# Patient Record
Sex: Female | Born: 1955 | Race: White | Hispanic: No | Marital: Married | Smoking: Never smoker
Health system: Southern US, Community
[De-identification: ages and names within clinical notes are randomized; demographics above are authoritative.]

## PROBLEM LIST (undated history)

## (undated) DIAGNOSIS — I10 Essential (primary) hypertension: Secondary | ICD-10-CM

## (undated) DIAGNOSIS — E785 Hyperlipidemia, unspecified: Secondary | ICD-10-CM

## (undated) DIAGNOSIS — Z8042 Family history of malignant neoplasm of prostate: Secondary | ICD-10-CM

## (undated) DIAGNOSIS — Z800 Family history of malignant neoplasm of digestive organs: Secondary | ICD-10-CM

## (undated) DIAGNOSIS — Z803 Family history of malignant neoplasm of breast: Secondary | ICD-10-CM

## (undated) DIAGNOSIS — C439 Malignant melanoma of skin, unspecified: Secondary | ICD-10-CM

## (undated) DIAGNOSIS — E039 Hypothyroidism, unspecified: Secondary | ICD-10-CM

## (undated) DIAGNOSIS — C50919 Malignant neoplasm of unspecified site of unspecified female breast: Secondary | ICD-10-CM

## (undated) DIAGNOSIS — Z923 Personal history of irradiation: Secondary | ICD-10-CM

## (undated) DIAGNOSIS — Z85820 Personal history of malignant melanoma of skin: Secondary | ICD-10-CM

## (undated) DIAGNOSIS — I8390 Asymptomatic varicose veins of unspecified lower extremity: Secondary | ICD-10-CM

## (undated) HISTORY — PX: BREAST LUMPECTOMY: SHX2

## (undated) HISTORY — DX: Hypothyroidism, unspecified: E03.9

## (undated) HISTORY — DX: Family history of malignant neoplasm of prostate: Z80.42

## (undated) HISTORY — PX: VARICOSE VEIN SURGERY: SHX832

## (undated) HISTORY — PX: BREAST BIOPSY: SHX20

## (undated) HISTORY — DX: Hyperlipidemia, unspecified: E78.5

## (undated) HISTORY — DX: Family history of malignant neoplasm of digestive organs: Z80.0

## (undated) HISTORY — DX: Asymptomatic varicose veins of unspecified lower extremity: I83.90

## (undated) HISTORY — DX: Personal history of malignant melanoma of skin: Z85.820

## (undated) HISTORY — DX: Family history of malignant neoplasm of breast: Z80.3

## (undated) HISTORY — DX: Malignant melanoma of skin, unspecified: C43.9

---

## 2004-03-12 HISTORY — PX: BREAST BIOPSY: SHX20

## 2007-06-12 LAB — HM DEXA SCAN: HM DEXA SCAN: NORMAL

## 2007-06-15 ENCOUNTER — Encounter: Admission: RE | Admit: 2007-06-15 | Discharge: 2007-06-15 | Payer: Self-pay | Admitting: Family Medicine

## 2008-06-20 ENCOUNTER — Encounter: Admission: RE | Admit: 2008-06-20 | Discharge: 2008-06-20 | Payer: Self-pay | Admitting: Family Medicine

## 2008-07-26 ENCOUNTER — Ambulatory Visit: Payer: Self-pay | Admitting: Gastroenterology

## 2008-08-30 ENCOUNTER — Ambulatory Visit: Payer: Self-pay | Admitting: Internal Medicine

## 2009-06-24 ENCOUNTER — Encounter: Admission: RE | Admit: 2009-06-24 | Discharge: 2009-06-24 | Payer: Self-pay | Admitting: Family Medicine

## 2010-07-11 LAB — HM MAMMOGRAPHY: HM MAMMO: NORMAL

## 2010-07-29 ENCOUNTER — Encounter: Admission: RE | Admit: 2010-07-29 | Discharge: 2010-07-29 | Payer: Self-pay | Admitting: Family Medicine

## 2010-09-09 LAB — HM PAP SMEAR: HM PAP: NORMAL

## 2010-09-14 ENCOUNTER — Emergency Department (HOSPITAL_COMMUNITY)
Admission: EM | Admit: 2010-09-14 | Discharge: 2010-09-15 | Payer: Self-pay | Source: Home / Self Care | Admitting: Emergency Medicine

## 2010-09-15 ENCOUNTER — Ambulatory Visit
Admission: RE | Admit: 2010-09-15 | Discharge: 2010-09-15 | Payer: Self-pay | Source: Home / Self Care | Attending: Orthopedic Surgery | Admitting: Orthopedic Surgery

## 2010-12-22 LAB — POCT HEMOGLOBIN-HEMACUE: Hemoglobin: 12.7 g/dL (ref 12.0–15.0)

## 2012-10-26 ENCOUNTER — Other Ambulatory Visit: Payer: Self-pay | Admitting: Family Medicine

## 2012-10-26 DIAGNOSIS — Z1231 Encounter for screening mammogram for malignant neoplasm of breast: Secondary | ICD-10-CM

## 2012-11-29 ENCOUNTER — Ambulatory Visit
Admission: RE | Admit: 2012-11-29 | Discharge: 2012-11-29 | Disposition: A | Discharge status: Home / Self Care | Payer: 59 | Source: Ambulatory Visit | Attending: Family Medicine | Admitting: Family Medicine

## 2012-11-29 DIAGNOSIS — Z1231 Encounter for screening mammogram for malignant neoplasm of breast: Secondary | ICD-10-CM

## 2014-01-14 ENCOUNTER — Other Ambulatory Visit: Payer: Self-pay

## 2014-01-14 DIAGNOSIS — Z1231 Encounter for screening mammogram for malignant neoplasm of breast: Secondary | ICD-10-CM

## 2014-01-30 ENCOUNTER — Ambulatory Visit
Admission: RE | Admit: 2014-01-30 | Discharge: 2014-01-30 | Disposition: A | Discharge status: Home / Self Care | Payer: 59 | Source: Ambulatory Visit

## 2014-01-30 DIAGNOSIS — Z1231 Encounter for screening mammogram for malignant neoplasm of breast: Secondary | ICD-10-CM

## 2014-06-28 LAB — BASIC METABOLIC PANEL
BUN: 20 mg/dL (ref 4–21)
Creatinine: 0.8 mg/dL (ref <=1.1)
GLUCOSE: 90 mg/dL
Potassium: 4.6 mmol/L (ref 3.4–5.3)
Sodium: 139 mmol/L (ref 137–147)

## 2014-06-28 LAB — LIPID PANEL
CHOLESTEROL: 234 mg/dL — AB (ref 0–200)
HDL: 66 mg/dL (ref 35–70)
LDL CALC: 167 mg/dL
LDl/HDL Ratio: 2.5
Triglycerides: 94 mg/dL (ref 40–160)

## 2014-06-28 LAB — HEMOGLOBIN A1C: Hgb A1c MFr Bld: 5.9 % (ref 4.0–6.0)

## 2014-06-28 LAB — HEPATIC FUNCTION PANEL
ALT: 15 U/L (ref 7–35)
AST: 15 U/L (ref 13–35)

## 2014-06-28 LAB — CBC AND DIFFERENTIAL
HEMATOCRIT: 42 % (ref 36–46)
Hemoglobin: 14 g/dL (ref 12.0–16.0)
Platelets: 269 10*3/uL (ref 150–399)
WBC: 4.6 10^3/mL

## 2014-07-23 ENCOUNTER — Encounter: Payer: Self-pay | Admitting: Internal Medicine

## 2015-01-06 ENCOUNTER — Other Ambulatory Visit: Payer: Self-pay

## 2015-01-06 ENCOUNTER — Encounter: Payer: Self-pay | Admitting: Family Medicine

## 2015-01-06 ENCOUNTER — Ambulatory Visit (INDEPENDENT_AMBULATORY_CARE_PROVIDER_SITE_OTHER): Payer: 59 | Admitting: Family Medicine

## 2015-01-06 DIAGNOSIS — Z79890 Hormone replacement therapy (postmenopausal): Secondary | ICD-10-CM | POA: Diagnosis not present

## 2015-01-06 DIAGNOSIS — C439 Malignant melanoma of skin, unspecified: Secondary | ICD-10-CM | POA: Insufficient documentation

## 2015-01-06 DIAGNOSIS — F329 Major depressive disorder, single episode, unspecified: Secondary | ICD-10-CM

## 2015-01-06 DIAGNOSIS — E039 Hypothyroidism, unspecified: Secondary | ICD-10-CM | POA: Diagnosis not present

## 2015-01-06 DIAGNOSIS — I8390 Asymptomatic varicose veins of unspecified lower extremity: Secondary | ICD-10-CM | POA: Insufficient documentation

## 2015-01-06 DIAGNOSIS — Z85820 Personal history of malignant melanoma of skin: Secondary | ICD-10-CM | POA: Insufficient documentation

## 2015-01-06 DIAGNOSIS — F32A Depression, unspecified: Secondary | ICD-10-CM

## 2015-01-06 LAB — TSH: TSH: 2.9

## 2015-01-06 LAB — T4, FREE
T3, Free: 2.7
T4, Total: 1.04

## 2015-01-06 NOTE — Progress Notes (Signed)
Julie Reddish, MD Phone: [phone]  Subjective:  Patient presents today to establish care with [place] as Julie Poot, MD who is not practicing currently.  Julie Pugh, alternative medicine seeing alongside Julie Pugh. She was treating patient with hormone therapy.Chief complaint-noted.   Depression/Stress Complains of depressed mood for several months. Denies history of this. No significant psychiatric family history. She has good support system through church but unfortunately not through her husband. Her PHQ9 today is 11. She scores 2 for #1 and 3 for #2 and 0 for #9. She rates this as making her life somewhat difficult.  She has seen a Christian therapist Julie Pugh in the past with difficulties with her teenage daughter but never for depression. Stressors: 1. Occasional headache, slightly more tired, not as much energy over last few months. Caring for almost 61 year old mother for almost 4 months with history 3 falls and needing constant care. Sister will care for mother after April 16th.  2. Rough time with marriage-considering divorce-disconnected 3. Freshman at [employer] and empty nest since fall 2016.   ROS-Denies suicidal or homicidal ideation.  Hypothyroidism-well-controlled on Armour thyroid 60 mg Lab Results  Component Value Date   TSH 2.9 06/28/2014   On thyroid medication-yes and compliant ROS-No hair or nail changes. No heat/cold intolerance. No constipation or diarrhea. Denies shakiness. No palpitations.    The following were reviewed and entered/updated in epic: Past Medical History  Diagnosis Date  . Hypothyroidism     Dr. Jaynie Pugh managing. armour thyroid 60mg    . Melanoma     R shin 2009. sees derm q6 months  . Varicose vein    Patient Active Problem List   Diagnosis Date Noted  . Depression 01/07/2015    Priority: Medium  . Hypothyroidism     Priority: Medium  . Melanoma     Priority: Medium  . Hormone replacement therapy 01/07/2015    Priority:  Low  . Varicose vein     Priority: Low   Past Surgical History  Procedure Laterality Date  . Breast biopsy      benign 2000-stereotactic biopsy  . Varicose vein surgery      2001    Family History  Problem Relation Age of Onset  . Panic disorder Mother   . COPD Mother     smoked until 40  . Congestive Heart Failure Mother     pacemaker  . Heart attack Father     bypass 56, 77 fatal MI, former smoker  . Hyperlipidemia Sister   . Hypertension Mother   No family history of bipolar  Medications- reviewed and updated Current Outpatient Prescriptions  Medication Sig Dispense Refill  . estradiol (ESTRACE) 0.5 MG tablet Take 0.5 mg by mouth daily. Estradiol/progesterone 0.5/50mg     . thyroid (ARMOUR) 60 MG tablet Take 60 mg by mouth daily before breakfast.    . Vitamin Mixture (ESTER-C PO) Take by mouth.     Allergies-reviewed and updated No Known Allergies  History   Social History  . Marital Status: Married    Spouse Name: N/A  . Number of Children: N/A  . Years of Education: N/A   Social History Main Topics  . Smoking status: Never Smoker   . Smokeless tobacco: Not on file  . Alcohol Use: 2.4 oz/week    4 Standard drinks or equivalent per week  . Drug Use: No  . Sexual Activity: Yes   Other Topics Concern  . Not on file   Social History Narrative  Married (marriage is a stressor). 1 daughter Julie Pugh at [employer] age 24 in 2016.       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       Hobbies: tennis, walking, time with dogs, pickleball. Reading. Outdoors.     ROS--See HPI , otherwise full ROS was completed and negative except as noted above  Objective: BP 128/78 mmHg  Pulse 64  Temp(Src) 98.9 F (37.2 C)  Ht 5\' 7"  (1.702 m)  Wt 165 lb (74.844 kg)  BMI 25.84 kg/m2 Gen: NAD, resting comfortably HEENT: Mucous membranes are moist. Oropharynx normal. TM normal. Eyes: sclera and lids normal, PERRLA Neck: no  thyromegaly, no cervical lymphadenopathy CV: RRR no murmurs rubs or gallops Lungs: CTAB no crackles, wheeze, rhonchi Abdomen: soft/nontender/nondistended/normal bowel sounds. No rebound or guarding.  Ext: trace edema L (hx melanoma excision), no edema R Skin: warm, dry, 2+ PT pulses Neuro: 5/5 strength in upper and lower extremities, normal gait, normal reflexes   Assessment/Plan:  Depression Poor control as evidenced by PHQ9 of 11. New diagnosis. We discussed medication versus therapy option. Patient would like to start seeing a therapist again. He decided we will check again in 6 weeks. We will consider medication options at that time if not making improvement. She knows the return precautions for sooner return   Hypothyroidism Patient has done very well on Armour thyroid. We discussed synthetics are much more commonly use and data supported.  Given stability the patient would like to continue on current therapy think that is reasonable. Continue Armour Thyroid 60 mg   Hormone replacement therapy We decided to taper off these medicines. Patient is not sure if she is helped by them. she has been off for 6 months and then on for 5 weeks and has not noted any changes. she does have some hot flashes that are unchanged on medication most recently but improved in the past on medication.    six-week follow-up   We also discussed the role of exercise in helping patient with depression and helping her maintain normal weight. 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 She previously has been very active and tends to times a week and walking basis.  Meds ordered this encounter  Medications  . thyroid (ARMOUR) 60 MG tablet    Sig: Take 60 mg by mouth daily before breakfast.  . Vitamin Mixture (ESTER-C PO)    Sig: Take by mouth.  . DISCONTD: estradiol (ESTRACE) 0.5 MG tablet    Sig: Take 0.5 mg by mouth daily. Estradiol/progesterone  0.5/50mg    >50% of 45 minute office visit was spent on counseling in regards to depression and treatment options and coordination of care

## 2015-01-06 NOTE — Patient Instructions (Addendum)
Depression  Start back with your counselor. Try to meet at least weekly.   See me back in 6 weeks to check in.   I am here for you if you need to talk before then.   Recheck phq9 at follow up, get repeat thyroid if still not controlled  Blood pressure looks great and will recheck at follow up.   Continue your armour thyroid alone. Take your estrogen pill 1/2 tab for a week then 1/2 tab every other day for a week then stop.

## 2015-01-07 ENCOUNTER — Other Ambulatory Visit: Payer: Self-pay

## 2015-01-07 ENCOUNTER — Encounter: Payer: Self-pay | Admitting: Family Medicine

## 2015-01-07 DIAGNOSIS — Z1231 Encounter for screening mammogram for malignant neoplasm of breast: Secondary | ICD-10-CM

## 2015-01-07 DIAGNOSIS — F32A Depression, unspecified: Secondary | ICD-10-CM | POA: Insufficient documentation

## 2015-01-07 DIAGNOSIS — Z79890 Hormone replacement therapy (postmenopausal): Secondary | ICD-10-CM | POA: Insufficient documentation

## 2015-01-07 DIAGNOSIS — F329 Major depressive disorder, single episode, unspecified: Secondary | ICD-10-CM | POA: Insufficient documentation

## 2015-01-07 NOTE — Assessment & Plan Note (Signed)
We decided to taper off these medicines. Patient is not sure if she is helped by them. she has been off for 6 months and then on for 5 weeks and has not noted any changes. she does have some hot flashes that are unchanged on medication most recently but improved in the past on medication.

## 2015-01-07 NOTE — Assessment & Plan Note (Signed)
Poor control as evidenced by PHQ9 of 11. New diagnosis. We discussed medication versus therapy option. Patient would like to start seeing a therapist again. He decided we will check again in 6 weeks. We will consider medication options at that time if not making improvement. She knows the return precautions for sooner return

## 2015-01-07 NOTE — Assessment & Plan Note (Signed)
Patient has done very well on Armour thyroid. We discussed synthetics are much more commonly use and data supported.  Given stability the patient would like to continue on current therapy think that is reasonable. Continue Armour Thyroid 60 mg

## 2015-01-09 ENCOUNTER — Encounter: Payer: Self-pay | Admitting: Family Medicine

## 2015-01-12 ENCOUNTER — Encounter: Payer: Self-pay | Admitting: Family Medicine

## 2015-01-12 DIAGNOSIS — R87619 Unspecified abnormal cytological findings in specimens from cervix uteri: Secondary | ICD-10-CM | POA: Insufficient documentation

## 2015-02-04 ENCOUNTER — Ambulatory Visit
Admission: RE | Admit: 2015-02-04 | Discharge: 2015-02-04 | Disposition: A | Discharge status: Home / Self Care | Payer: 59 | Source: Ambulatory Visit

## 2015-02-04 DIAGNOSIS — Z1231 Encounter for screening mammogram for malignant neoplasm of breast: Secondary | ICD-10-CM

## 2015-02-19 ENCOUNTER — Encounter: Payer: Self-pay | Admitting: Family Medicine

## 2015-02-19 ENCOUNTER — Ambulatory Visit (INDEPENDENT_AMBULATORY_CARE_PROVIDER_SITE_OTHER): Payer: 59 | Admitting: Family Medicine

## 2015-02-19 VITALS — BP 128/72 | HR 66 | Temp 98.5°F | Wt 167.0 lb

## 2015-02-19 DIAGNOSIS — I868 Varicose veins of other specified sites: Secondary | ICD-10-CM

## 2015-02-19 DIAGNOSIS — F329 Major depressive disorder, single episode, unspecified: Secondary | ICD-10-CM | POA: Diagnosis not present

## 2015-02-19 DIAGNOSIS — Z79890 Hormone replacement therapy (postmenopausal): Secondary | ICD-10-CM | POA: Diagnosis not present

## 2015-02-19 DIAGNOSIS — F32A Depression, unspecified: Secondary | ICD-10-CM

## 2015-02-19 DIAGNOSIS — I8390 Asymptomatic varicose veins of unspecified lower extremity: Secondary | ICD-10-CM

## 2015-02-19 NOTE — Assessment & Plan Note (Signed)
Estradiol/progesterone 0.5/50mg  previously under the care Dr. Jaynie Collins on therapy >5 years. Now down to 1 capsule every other day. Patient will stop when runs out-primary Treatment was for hot flashes which has not improved much. Discussed benefits/risks and patient will follow up if decides does have a benefit when comes off.

## 2015-02-19 NOTE — Patient Instructions (Addendum)
I would continue to see your counselor but depression in much better shape. I would keep your counselor's number close in case there are any changes.   I don't think you have to see me unless you need me until Late September for annual physical. Labs standard a week before plus HIV per national screening recommendations-do this under unprotected sex with husband. Then get Pap day of.   Health Maintenance Due  Topic Date Due  . HIV Screening  01/21/1969  . TETANUS/TDAP  01/21/1973  . PAP SMEAR  09/09/2013  . ZOSTAVAX  01/21/2014

## 2015-02-19 NOTE — Progress Notes (Signed)
Garret Reddish, MD  Subjective:  Julie Pugh is a 59 y.o. year old very pleasant female patient who presents with:  Depression follow up- improved -PHQ9 last visit of 11, new diagnosis. Patient wanted to work with Darrick Meigs therapist and return to care without medication changes. She followed with her therapist and PHQ9 came down today to a 2 with no anhedonia or depressed mood. A lot of this improvement came when her mother moved out of the house a few weeks ago. 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 No depressed mood  Hormone replacement therapy- stable smptoms Decided to taper off of medications. Had been off 6 months then off 5 weeks and did not note improvement in her hot flashes. 1/2 tab for a week then 1/2 tab evey other day for week then stop was planned. Unfortunately it was in capsule form per patient. She has been taking full tab every other day and plans to stop when runsout.   Varicose veins L leg. Prior surgery 2001. Prominent some aching. No worsening.   ROS- no chest pain, shortness of breath, calf pain  Past Medical History- hypothyroidism, melanoma history  Medications- reviewed and updated Current Outpatient Prescriptions  Medication Sig Dispense Refill  . estradiol (ESTRACE) 0.5 MG tablet Take 0.5 mg by mouth daily.    Marland Kitchen thyroid (ARMOUR) 60 MG tablet Take 60 mg by mouth daily before breakfast.    . Vitamin Mixture (ESTER-C PO) Take by mouth.     No current facility-administered medications for this visit.    Objective: BP 128/72 mmHg  Pulse 66  Temp(Src) 98.5 F (36.9 C)  Wt 167 lb (75.751 kg) Gen: NAD, resting comfortably CV: RRR no murmurs rubs or gallops Lungs: CTAB no crackles, wheeze, rhonchi Abdomen: soft/nontender/nondistended/normal bowel sounds. No rebound or guarding.  Ext: no edema, L leg larger than right at baseline at ankle though no pitting edema. Prominent left leg varicose veins.  Skin: warm,  dry Neuro: grossly normal, moves all extremities   Assessment/Plan:  Depression Improved PHQ9 from 11 to 2 using Christian therapist Arvil Chaco. Mom moved back with sister for 8 months which has helped a great deal. We will plan for 6 month follow up but closer follow up prn   Varicose vein Prominent vessel anterior shin. Some aching at times but tolerable. Discussed vein/vascular referral but patient declines at present.    Hormone replacement therapy Estradiol/progesterone 0.5/50mg  previously under the care Dr. Jaynie Collins on therapy >5 years. Now down to 1 capsule every other day. Patient will stop when runs out-primary Treatment was for hot flashes which has not improved much. Discussed benefits/risks and patient will follow up if decides does have a benefit when comes off.    September for CPE including PAP.

## 2015-02-19 NOTE — Assessment & Plan Note (Signed)
Prominent vessel anterior shin. Some aching at times but tolerable. Discussed vein/vascular referral but patient declines at present.

## 2015-02-19 NOTE — Assessment & Plan Note (Signed)
Improved PHQ9 from 11 to 2 using Christian therapist Arvil Chaco. Mom moved back with sister for 8 months which has helped a great deal. We will plan for 6 month follow up but closer follow up prn

## 2015-07-03 ENCOUNTER — Other Ambulatory Visit (INDEPENDENT_AMBULATORY_CARE_PROVIDER_SITE_OTHER): Payer: 59

## 2015-07-03 DIAGNOSIS — Z0000 Encounter for general adult medical examination without abnormal findings: Secondary | ICD-10-CM | POA: Diagnosis not present

## 2015-07-03 LAB — CBC WITH DIFFERENTIAL/PLATELET
Basophils Absolute: 0 K/uL (ref 0.0–0.1)
Basophils Relative: 0.5 % (ref 0.0–3.0)
Eosinophils Absolute: 0.2 K/uL (ref 0.0–0.7)
Eosinophils Relative: 3.2 % (ref 0.0–5.0)
HCT: 42.7 % (ref 36.0–46.0)
Hemoglobin: 14.5 g/dL (ref 12.0–15.0)
Lymphocytes Relative: 32.6 % (ref 12.0–46.0)
Lymphs Abs: 1.7 K/uL (ref 0.7–4.0)
MCHC: 34 g/dL (ref 30.0–36.0)
MCV: 88.9 fl (ref 78.0–100.0)
Monocytes Absolute: 0.5 K/uL (ref 0.1–1.0)
Monocytes Relative: 9.5 % (ref 3.0–12.0)
Neutro Abs: 2.9 K/uL (ref 1.4–7.7)
Neutrophils Relative %: 54.2 % (ref 43.0–77.0)
Platelets: 268 K/uL (ref 150.0–400.0)
RBC: 4.8 Mil/uL (ref 3.87–5.11)
RDW: 12.6 % (ref 11.5–15.5)
WBC: 5.3 K/uL (ref 4.0–10.5)

## 2015-07-03 LAB — BASIC METABOLIC PANEL
BUN: 21 mg/dL (ref 6–23)
CHLORIDE: 107 meq/L (ref 96–112)
CO2: 29 mEq/L (ref 19–32)
Calcium: 10.1 mg/dL (ref 8.4–10.5)
Creatinine, Ser: 0.91 mg/dL (ref 0.40–1.20)
GFR: 66.7 mL/min (ref >60.00)
GLUCOSE: 95 mg/dL (ref 70–99)
POTASSIUM: 4.7 meq/L (ref 3.5–5.1)
Sodium: 141 mEq/L (ref 135–145)

## 2015-07-03 LAB — POCT URINALYSIS DIPSTICK
Bilirubin, UA: NEGATIVE
Glucose, UA: NEGATIVE
KETONES UA: NEGATIVE
Leukocytes, UA: NEGATIVE
Nitrite, UA: NEGATIVE
PH UA: 5
PROTEIN UA: NEGATIVE
RBC UA: NEGATIVE
Urobilinogen, UA: 0.2

## 2015-07-03 LAB — LIPID PANEL
Cholesterol: 219 mg/dL — ABNORMAL HIGH (ref 0–200)
HDL: 54 mg/dL (ref >39.00)
LDL Cholesterol: 150 mg/dL — ABNORMAL HIGH (ref 0–99)
NONHDL: 165.04
Total CHOL/HDL Ratio: 4
Triglycerides: 77 mg/dL (ref 0.0–149.0)
VLDL: 15.4 mg/dL (ref 0.0–40.0)

## 2015-07-03 LAB — HEPATIC FUNCTION PANEL
ALT: 14 U/L (ref 0–35)
AST: 15 U/L (ref 0–37)
Albumin: 3.9 g/dL (ref 3.5–5.2)
Alkaline Phosphatase: 53 U/L (ref 39–117)
Bilirubin, Direct: 0.1 mg/dL (ref 0.0–0.3)
Total Bilirubin: 0.5 mg/dL (ref 0.2–1.2)
Total Protein: 7.1 g/dL (ref 6.0–8.3)

## 2015-07-03 LAB — TSH: TSH: 3.33 u[IU]/mL (ref 0.35–4.50)

## 2015-07-10 ENCOUNTER — Ambulatory Visit (INDEPENDENT_AMBULATORY_CARE_PROVIDER_SITE_OTHER): Payer: 59 | Admitting: Family Medicine

## 2015-07-10 ENCOUNTER — Other Ambulatory Visit (HOSPITAL_COMMUNITY)
Admission: RE | Admit: 2015-07-10 | Discharge: 2015-07-10 | Disposition: A | Discharge status: Home / Self Care | Payer: 59 | Source: Ambulatory Visit | Attending: Family Medicine | Admitting: Family Medicine

## 2015-07-10 ENCOUNTER — Encounter: Payer: Self-pay | Admitting: Family Medicine

## 2015-07-10 VITALS — BP 138/86 | HR 87 | Temp 98.4°F | Ht 67.0 in | Wt 162.0 lb

## 2015-07-10 DIAGNOSIS — E785 Hyperlipidemia, unspecified: Secondary | ICD-10-CM | POA: Insufficient documentation

## 2015-07-10 DIAGNOSIS — E039 Hypothyroidism, unspecified: Secondary | ICD-10-CM

## 2015-07-10 DIAGNOSIS — Z0000 Encounter for general adult medical examination without abnormal findings: Secondary | ICD-10-CM

## 2015-07-10 DIAGNOSIS — Z01419 Encounter for gynecological examination (general) (routine) without abnormal findings: Secondary | ICD-10-CM | POA: Diagnosis not present

## 2015-07-10 NOTE — Patient Instructions (Addendum)
Thyroid recheck in 3 months- normal off medicine, hopefully you can remain off. If you gain weight, get constipated, note yourself feeling cooler, we will repeat earlier  [place] job losing a few lbs. Cholesterol is doing some better. No cholesterol medicine at this time, recheck yearly.   Overall I think you are doing great  Would advise getting Tetanus shot before you go on medicare  See you back in a year

## 2015-07-10 NOTE — Progress Notes (Signed)
Julie Reddish, MD Phone: [phone]  Subjective:  Patient presents today for their annual physical. Chief complaint-noted.   -overall doing well. Stopped taking thyroid medicine yet levels are normal. Things going much better in her life, has not even had to see her counselor ROS- full  review of systems was completed and negative  Pertinent negative: No hair or nail changes. No heat/cold intolerance. No constipation or diarrhea. Denies shakiness or anxiety.  No palpitations.   The following were reviewed and entered/updated in epic: Past Medical History  Diagnosis Date  . Hypothyroidism     Dr. Jaynie Collins managing. armour thyroid 60mg    . Melanoma     R shin 2009. sees derm q6 months  . Varicose vein    Patient Active Problem List   Diagnosis Date Noted  . Depression 01/07/2015    Priority: Medium  . Hypothyroidism     Priority: Medium  . Melanoma     Priority: Medium  . Abnormal Pap smear of cervix 01/12/2015    Priority: Low  . Hormone replacement therapy 01/07/2015    Priority: Low  . Varicose vein     Priority: Low   Past Surgical History  Procedure Laterality Date  . Breast biopsy      benign 2000-stereotactic biopsy  . Varicose vein surgery      2001    Family History  Problem Relation Age of Onset  . Panic disorder Mother   . COPD Mother     smoked until 12  . Congestive Heart Failure Mother     pacemaker  . Heart attack Father     bypass 70, 56 fatal MI, former smoker  . Hyperlipidemia Sister   . Hypertension Mother     Medications- reviewed and updated Current Outpatient Prescriptions  Medication Sig Dispense Refill  . thyroid (ARMOUR) 60 MG tablet  NOT TAKING Take 60 mg by mouth daily before breakfast.     Allergies-reviewed and updated No Known Allergies  Social History   Social History  . Marital Status: Married    Spouse Name: N/A  . Number of Children: N/A  . Years of Education: N/A   Social History Main Topics  . Smoking  status: Never Smoker   . Smokeless tobacco: None  . Alcohol Use: 2.4 oz/week    4 Standard drinks or equivalent per week  . Drug Use: No  . Sexual Activity: Yes   Other Topics Concern  . None   Social History Narrative   Married (marriage is a stressor). 1 daughter Cloyde Reams at [employer] age 36 in 2016.       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       Hobbies: tennis, walking, time with dogs, pickleball. Reading. Outdoors.    ROS--See HPI   Objective: BP 138/86 mmHg  Pulse 87  Temp(Src) 98.4 F (36.9 C)  Ht 5\' 7"  (1.702 m)  Wt 162 lb (73.483 kg)  BMI 25.37 kg/m2 Gen: NAD, resting comfortably HEENT: Mucous membranes are moist. Oropharynx normal Neck: no thyromegaly CV: RRR no murmurs rubs or gallops Lungs: CTAB no crackles, wheeze, rhonchi Abdomen: soft/nontender/nondistended/normal bowel sounds. No rebound or guarding.  Breasts: normal appearance, no masses or tenderness Pelvic: cervix normal in appearance, external genitalia normal, no adnexal masses or tenderness, no cervical motion tenderness, uterus normal size, shape, and consistency and vagina normal without discharge Ext: no edema Skin: warm, dry Neuro: grossly normal, moves all extremities, PERRLA  Assessment/Plan:  59 y.o. female presenting for annual physical.  Health Maintenance counseling: 1. Anticipatory guidance: Patient counseled regarding regular dental exams, wearing seatbelts, wearing sunscreen, regular eye exams 2. Risk factor reduction:  Advised patient of need for regular exercise and diet rich and fruits and vegetables to reduce risk of heart attack and stroke.  3. Immunizations/screenings/ancillary studies- declined flu  Health Maintenance Due  Topic Date Due  . Hepatitis C Screening - declined 1956/04/10  . HIV Screening - declined 01/21/1969  . TETANUS/TDAP - declines 01/21/1973  . PAP SMEAR - today 09/09/2013  . ZOSTAVAX - declines permanently  01/21/2014  4. Cervical cancer screening- history ascus 2008, normal 2011 5. Breast cancer screening-  breast exam today normal and mammogram 02/05/15 normal. Does self exams 6. Colon cancer screening - 08/30/2008 normal- 10 year repeat 7. Skin cancer screening- history melanoma- sees Dr. Martinique regularly  Hypothyroidism Recheck TSH in 3 months as stopped thyroid medication (has already been 3 months though and levels normal). ? True hypothyroidism-only time will tell. Symptoms reviewed for earlier return  1 year CPE   Orders Placed This Encounter  Procedures  . TSH    [place]    Standing Status: Future     Number of Occurrences:      Standing Expiration Date: 07/09/2016

## 2015-07-10 NOTE — Assessment & Plan Note (Signed)
Recheck TSH in 3 months as stopped thyroid medication (has already been 3 months though and levels normal). ? True hypothyroidism-only time will tell. Symptoms reviewed for earlier return

## 2015-07-11 LAB — CYTOLOGY - PAP

## 2015-09-16 ENCOUNTER — Telehealth: Payer: Self-pay | Admitting: Family Medicine

## 2015-09-16 NOTE — Telephone Encounter (Signed)
Im not sure of the purpose of checking. We can discuss at visit.

## 2015-09-16 NOTE — Telephone Encounter (Signed)
Julie Pugh called saying in addition to her thyroid she'd like her hormones checked as well (estrogen). She's wondering if this can be done. Please give her a call if you have questions or concerns.  Pt ph# [phone] Thank you.

## 2015-09-16 NOTE — Telephone Encounter (Signed)
See below

## 2015-09-17 NOTE — Telephone Encounter (Signed)
Lm on pt vm.

## 2015-09-29 ENCOUNTER — Other Ambulatory Visit (INDEPENDENT_AMBULATORY_CARE_PROVIDER_SITE_OTHER): Payer: 59

## 2015-09-29 DIAGNOSIS — R5383 Other fatigue: Secondary | ICD-10-CM | POA: Diagnosis not present

## 2015-09-29 DIAGNOSIS — E039 Hypothyroidism, unspecified: Secondary | ICD-10-CM | POA: Diagnosis not present

## 2015-09-29 LAB — TESTOSTERONE: Testosterone: 46.6 ng/dL — ABNORMAL HIGH (ref 15.00–40.00)

## 2015-09-29 LAB — TSH: TSH: 1.83 u[IU]/mL (ref 0.35–4.50)

## 2015-09-30 LAB — PROGESTERONE: PROGESTERONE: 0.3 ng/mL

## 2015-10-02 LAB — ESTROGENS, TOTAL: Estrogen: 84.7 pg/mL

## 2015-11-06 ENCOUNTER — Telehealth: Payer: Self-pay | Admitting: Family Medicine

## 2015-11-06 NOTE — Telephone Encounter (Signed)
Pt was getting Armour Thyroid from another md but would like to know if you would begining refilling that Rx for her. She takes 60mg  dose and she uses [facility] on [address]

## 2015-11-07 NOTE — Telephone Encounter (Signed)
She told me in September she stopped the medicine. Her TSH was normal at that time. Is she having symptoms now? Before restarting would repeat TSH. In addition I use levothyroxine instead of armour thyroid typically. I dont mind prescribing but need more information

## 2015-11-07 NOTE — Telephone Encounter (Signed)
See below, Estill Bamberg forgot to add the below message when she took the original message.

## 2015-11-07 NOTE — Telephone Encounter (Signed)
If she was on the medicine the whole time- unclear why she did not correct me when I reviewed AVS with her which stated:   "Thyroid recheck in 3 months- normal off medicine, hopefully you can remain off. If you gain weight, get constipated, note yourself feeling cooler, we will repeat earlier"  Since her story today is inconsistent with previous documentation (not saying that she is not telling truth- just saying that there was at minimum a miscommunication). I at minimum will need notes from her prior provider as well as listing of when medicine was last filled and how many refills- will need to check with pharmacy. Any records from last note? Can you call her pharmacy? After these steps- update me please

## 2015-11-07 NOTE — Telephone Encounter (Signed)
Called and lm on pt vm tcb to get below information.

## 2015-11-07 NOTE — Telephone Encounter (Signed)
May refill armour thyroid for 3 months. I am willing to continue until she sees endocrine and happy to continue afterwards if I get some guidance  Also place referral for endocrine to Dr. Cruzita Lederer  I would like her to weigh in on goals for TSH. Patient off armour for 3 months had TSH of mid 3. She felt very fatigued and when restarted medicine and TSH under 2 symptoms improved. I am not clear with "normal" TSH off medicine if this is true hypothyroidism so I would like her opinion and help with long term goals for treatment.

## 2015-11-07 NOTE — Telephone Encounter (Signed)
Is this ok?

## 2015-11-07 NOTE — Telephone Encounter (Signed)
Pt states that she informed you that she was taking this medication when she saw you last. She states she has been on Armour Thyroid for years and does not want to be on Levothyroxine, she also does not see the need for lab work since she is not having any symptoms/problems. Please advise.

## 2015-11-07 NOTE — Telephone Encounter (Signed)
When patient called in she did mention having been off of the medicine but wanted to start it again 'because she felt better on the medication and had been feeling sluggish'.

## 2015-11-08 ENCOUNTER — Other Ambulatory Visit: Payer: Self-pay | Admitting: Family Medicine

## 2015-11-08 DIAGNOSIS — E039 Hypothyroidism, unspecified: Secondary | ICD-10-CM

## 2015-11-08 MED ORDER — THYROID 60 MG PO TABS: 60.0000 mg | ORAL_TABLET | Freq: Every day | ORAL | Status: DC

## 2015-11-08 NOTE — Telephone Encounter (Signed)
Armour Thyroid refilled and referral placed to endocrine.

## 2015-11-26 ENCOUNTER — Encounter: Payer: Self-pay | Admitting: Internal Medicine

## 2015-11-26 ENCOUNTER — Ambulatory Visit (INDEPENDENT_AMBULATORY_CARE_PROVIDER_SITE_OTHER): Payer: 59 | Admitting: Internal Medicine

## 2015-11-26 VITALS — BP 110/60 | HR 72 | Temp 98.0°F | Resp 12 | Ht 66.5 in | Wt 164.0 lb

## 2015-11-26 DIAGNOSIS — E039 Hypothyroidism, unspecified: Secondary | ICD-10-CM

## 2015-11-26 DIAGNOSIS — M791 Myalgia, unspecified site: Secondary | ICD-10-CM

## 2015-11-26 DIAGNOSIS — Z780 Asymptomatic menopausal state: Secondary | ICD-10-CM

## 2015-11-26 LAB — T4, FREE: Free T4: 0.82 ng/dL (ref 0.60–1.60)

## 2015-11-26 LAB — TSH: TSH: 1.31 u[IU]/mL (ref 0.35–4.50)

## 2015-11-26 LAB — T3, FREE: T3, Free: 3.4 pg/mL (ref 2.3–4.2)

## 2015-11-26 LAB — VITAMIN D 25 HYDROXY (VIT D DEFICIENCY, FRACTURES): VITD: 29.33 ng/mL — ABNORMAL LOW (ref 30.00–100.00)

## 2015-11-26 NOTE — Patient Instructions (Signed)
Please stop at the lab.  Continue Armour 60 mg daily.  Take the thyroid hormone every day, with water, at least 30 minutes before breakfast, separated by at least 4 hours from: - acid reflux medications - calcium - iron - multivitamins  Please come back for a follow-up appointment in 6 months.

## 2015-11-26 NOTE — Progress Notes (Signed)
Patient ID: Julie Pugh, female   DOB: 11-16-1955, 60 y.o.   MRN: [id number]   HPI  Julie Pugh is a 60 y.o.-year-old female, referred by her PCP, Dr. Yong Channel for management of hypothyroidism.  Pt. has been dx with hypothyroidism in 2012 (foggy mind, weight gain, fatigue); is on Armour 60 mg (equivalent of Levothyroxine 100 mcg), but was off last Summer >> TSH in the Fall of 2016: 3.33 >> repeat TSH 1.83.  She is taking Armour: - fasting - 5-6 am - with water - separated by >1h from b'fast  - no calcium, iron, PPIs, multivitamins   I reviewed pt's thyroid tests: Lab Results  Component Value Date   TSH 1.83 09/29/2015   TSH 3.33 07/03/2015   TSH 2.9 06/28/2014    Pt describes: - + fatigue - + foggy minded - no weight gain - no cold intolerance, + face rash - rosacea >> warm - no depression or anxiety - + more mood swings - no constipation - no dry skin - no hair loss  She stopped HRT 02/2015.  Pt denies feeling nodules in neck, hoarseness, dysphagia/odynophagia, SOB with lying down.  She has no FH of thyroid disorders. No FH of thyroid cancer.  No h/o radiation tx to head or neck. No recent use of iodine supplements.  I reviewed her chart and she also has a history of vitamin D def - stopped replacement >1 year ago.   ROS: Constitutional: See history of present illness Eyes: no blurry vision, no xerophthalmia ENT: no sore throat, no nodules palpated in throat, no dysphagia/odynophagia, no hoarseness Cardiovascular: no CP/SOB/palpitations/+ leg swelling Respiratory: no cough/SOB Gastrointestinal: no N/V/D/C Musculoskeletal: + Both: muscle/joint aches Skin: no rashes Neurological: no tremors/numbness/tingling/dizziness Psychiatric: no depression/anxiety + Low libido  Past Medical History  Diagnosis Date  . Hypothyroidism     Dr. Jaynie Collins managing. armour thyroid 60mg    . Melanoma ([place])     R shin 2009. sees derm q6 months  . Varicose vein    Past Surgical  History  Procedure Laterality Date  . Breast biopsy      benign 2000-stereotactic biopsy  . Varicose vein surgery      2001   Social History Main Topics  . Smoking status: Never Smoker   . Smokeless tobacco: Not on file  . Alcohol Use:     3 Standard drinks or equivalent per week  . Drug Use: No   Social History Narrative   Married (marriage is a stressor). 1 daughter Cloyde Reams at [employer] age 30 in 2016.       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       Hobbies: tennis, walking, time with dogs, pickleball. Reading. Outdoors.    Current Outpatient Prescriptions on File Prior to Visit  Medication Sig Dispense Refill  . thyroid (ARMOUR) 60 MG tablet Take 1 tablet (60 mg total) by mouth daily before breakfast. 90 tablet 0   No current facility-administered medications on file prior to visit.   No Known Allergies Family History  Problem Relation Age of Onset  . Panic disorder Mother   . COPD Mother     smoked until 37  . Congestive Heart Failure Mother     pacemaker  . Heart attack Father     bypass 61, 58 fatal MI, former smoker  . Hyperlipidemia Sister   . Hypertension Mother    PE: BP 110/60 mmHg  Pulse 72  Temp(Src) 98 F (  36.7 C) (Oral)  Resp 12  Ht 5' 6.5" (1.689 m)  Wt 164 lb (74.39 kg)  BMI 26.08 kg/m2  SpO2 97% Wt Readings from Last 3 Encounters:  11/26/15 164 lb (74.39 kg)  07/10/15 162 lb (73.483 kg)  02/19/15 167 lb (75.751 kg)   Constitutional: Slightly overweight, in NAD Eyes: PERRLA, EOMI, no exophthalmos ENT: moist mucous membranes, no thyromegaly, no cervical lymphadenopathy Cardiovascular: RRR, No MRG Respiratory: CTA B Gastrointestinal: abdomen soft, NT, ND, BS+ Musculoskeletal: no deformities, strength intact in all 4 Skin: moist, warm, no rashes Neurological: no tremor with outstretched hands, DTR normal in all 4  ASSESSMENT: 1. Hypothyroidism  2. Muscle aches  3. Postmenopausal  status  PLAN:  1. Patient with history of mild hypothyroidism, on desiccated thyroid extract. She appears euthyroid, but has some symptoms that could be related to hypothyroidism: Fatigue, foggy mind. Recent TFTs were reviewed and these were normal. - She has an unusual history of being off thyroid hormone for the whole 2016 summer, however, a TSH checked in 06/2015 returned normal, at 3.33. She was then started on the full dose of Armour 60 mg daily (equivalent of 100 g of levothyroxine) and her TSH remained normal (not suppressed), at 1.8. She does not have any side effects of using Armour, and no signs of over replacement. She also does not appear to have a goiter, thyroid nodules, or neck compression symptoms. - We discussed about whether to continue Armour or not, but we decided to continue it for now - We also discussed about correct intake of Armour, fasting, with water, separated by at least 30 minutes from breakfast, and separated by more than 4 hours from calcium, iron, multivitamins, acid reflux medications (PPIs). She is taking it correctly. - will check thyroid tests today: TSH, free T4, free T3 - If these are abnormal, she will need to return in 6-8 weeks for repeat labs - If these are normal, I will see her back in 6 months  2. Muscle aches - She complains of muscle aches despite being active and exercising 4-5 days per week (tennis, walking) >> will check a vitamin D   3. Postmenopausal status - Patient was on compounded hormone replacement therapy until last year. She did notice a difference when the hormones were stopped, and her fogginess could be related to being off estrogen. - We discussed about the risks of restarting hormone replacement, but I suggested that she discusses with OB/GYN to calculate her risk for side effects before restarting. I also suggested that if she restarts, to use Prometrium and estrogen patches.  Office Visit on 11/26/2015  Component Date Value Ref  Range Status  . T3, Free 11/26/2015 3.4  2.3 - 4.2 pg/mL Final  . Free T4 11/26/2015 0.82  0.60 - 1.60 ng/dL Final  . TSH 11/26/2015 1.31  0.35 - 4.50 uIU/mL Final  . VITD 11/26/2015 29.33* 30.00 - 100.00 ng/mL Final   Thyroid tests are great. Continue Armour 60 mg daily. Vitamin D is a little low, I would suggest to start 2000 units vitamin D over-the-counter daily.

## 2016-01-19 ENCOUNTER — Other Ambulatory Visit: Payer: Self-pay

## 2016-01-19 DIAGNOSIS — Z1231 Encounter for screening mammogram for malignant neoplasm of breast: Secondary | ICD-10-CM

## 2016-01-29 ENCOUNTER — Telehealth: Payer: Self-pay | Admitting: Family Medicine

## 2016-01-29 NOTE — Telephone Encounter (Signed)
LM for pt to callback to schedule NP appt °

## 2016-01-29 NOTE — Telephone Encounter (Signed)
Ok to transfer. 

## 2016-01-29 NOTE — Telephone Encounter (Signed)
Pt would like to trans care to Dr Birdie Riddle. Please advise ok to schedule.

## 2016-02-18 ENCOUNTER — Other Ambulatory Visit: Payer: Self-pay | Admitting: Family Medicine

## 2016-02-23 ENCOUNTER — Ambulatory Visit
Admission: RE | Admit: 2016-02-23 | Discharge: 2016-02-23 | Disposition: A | Discharge status: Home / Self Care | Payer: 59 | Source: Ambulatory Visit

## 2016-02-23 DIAGNOSIS — Z1231 Encounter for screening mammogram for malignant neoplasm of breast: Secondary | ICD-10-CM

## 2016-04-02 ENCOUNTER — Ambulatory Visit (INDEPENDENT_AMBULATORY_CARE_PROVIDER_SITE_OTHER): Payer: 59 | Admitting: Family Medicine

## 2016-04-02 ENCOUNTER — Encounter: Payer: Self-pay | Admitting: Family Medicine

## 2016-04-02 VITALS — BP 110/62 | HR 62 | Temp 98.7°F | Resp 16 | Ht 67.0 in | Wt 163.2 lb

## 2016-04-02 DIAGNOSIS — E785 Hyperlipidemia, unspecified: Secondary | ICD-10-CM

## 2016-04-02 DIAGNOSIS — S70361A Insect bite (nonvenomous), right thigh, initial encounter: Secondary | ICD-10-CM

## 2016-04-02 DIAGNOSIS — W57XXXA Bitten or stung by nonvenomous insect and other nonvenomous arthropods, initial encounter: Secondary | ICD-10-CM

## 2016-04-02 DIAGNOSIS — C4371 Malignant melanoma of right lower limb, including hip: Secondary | ICD-10-CM | POA: Diagnosis not present

## 2016-04-02 DIAGNOSIS — I8390 Asymptomatic varicose veins of unspecified lower extremity: Secondary | ICD-10-CM

## 2016-04-02 DIAGNOSIS — I868 Varicose veins of other specified sites: Secondary | ICD-10-CM

## 2016-04-02 DIAGNOSIS — E039 Hypothyroidism, unspecified: Secondary | ICD-10-CM | POA: Diagnosis not present

## 2016-04-02 DIAGNOSIS — S20469A Insect bite (nonvenomous) of unspecified back wall of thorax, initial encounter: Secondary | ICD-10-CM

## 2016-04-02 NOTE — Assessment & Plan Note (Signed)
Chronic problem.  Not currently on statin.  Attempting to control w/ healthy diet and regular exercise.  Will repeat labs at upcoming CPE.

## 2016-04-02 NOTE — Progress Notes (Signed)
   Subjective:    Patient ID: Julie Pugh, female    DOB: 05-31-1956, 60 y.o.   MRN: [id number]  HPI New to establish.  Previously seeing Dr Yong Channel.  Before this, Dr Greta Doom.  UTD on pap (due 2019), colonoscopy (due 2019), UTD on mammo (done 02/2016).    Hypothyroid- ongoing issue for pt, seeing Dr Cruzita Lederer.  On Armour Thyroid.  Hyperlipidemia- last LDL 150, total cholesterol 219.  Pt has never been on medication and is attempting to control w/ diet and exercise.  Exercising regularly- plays tennis.  Hx of Melanoma- pt is following regularly w/ Dr [place] yearly  Tick bites- pt was bit by 2 separate ticks 1 week ago.  Pt was seen at [facility] and started on Doxycycline Sunday.  Medication is causing nausea despite taking w/ food.  She is nearing end of 1 week course.  Pt denies any secondary infxn.    Varicose veins- L lower leg, pt will have throbbing w/ prolonged activity or standing.  Will wear support hose.  Has hx of vein stripping in [place] ~2000.  Pt is interested in seeing vascular surgeon for discussion on tx options.   Review of Systems For ROS see HPI     Objective:   Physical Exam  Constitutional: She is oriented to person, place, and time. She appears well-developed and well-nourished. No distress.  HENT:  Head: Normocephalic and atraumatic.  Eyes: Conjunctivae and EOM are normal. Pupils are equal, round, and reactive to light.  Neck: Normal range of motion. Neck supple. No thyromegaly present.  Cardiovascular: Normal rate, regular rhythm, normal heart sounds and intact distal pulses.   No murmur heard. Varicose veins on L lower leg  Pulmonary/Chest: Effort normal and breath sounds normal. No respiratory distress.  Abdominal: Soft. She exhibits no distension. There is no tenderness.  Musculoskeletal: She exhibits no edema.  Lymphadenopathy:    She has no cervical adenopathy.  Neurological: She is alert and oriented to person, place, and time.  Skin: Skin is warm and dry. No  rash noted. No erythema (no surrounding redness or induration of bite on R mid back and R posterior thigh).  Psychiatric: She has a normal mood and affect. Her behavior is normal.  Vitals reviewed.         Assessment & Plan:

## 2016-04-02 NOTE — Assessment & Plan Note (Signed)
New to provider.  Pt is being treated appropriately w/ Doxy.  No evidence of secondary infxn.  No need for lyme or RMSF titers as pt has been on abx.  No rashes, fevers, HAs, or other systemic sxs.

## 2016-04-02 NOTE — Patient Instructions (Addendum)
Schedule your complete physical in 4 months We'll call you with your vascular appt for the varicose veins Continue to work on healthy diet and regular exercise- you can do it! Finish the Doxycycline as directed- take w/ food Call with any questions or concerns Welcome!  We're glad to have you! Have a great summer!!!

## 2016-04-02 NOTE — Assessment & Plan Note (Signed)
New to provider.  Pt is following w/ Derm yearly.

## 2016-04-02 NOTE — Assessment & Plan Note (Signed)
New to provider.  Ongoing for pt.  Following w/ Dr Cruzita Lederer.  Currently asymptomatic.  Will follow along and assist as able.

## 2016-04-02 NOTE — Assessment & Plan Note (Signed)
New to provider, ongoing for pt.  She is interested in a vascular referral to discuss additional tx options since her surgery in 2001.  Referral placed.

## 2016-04-02 NOTE — Progress Notes (Signed)
Pre visit review using our clinic review tool, if applicable. No additional management support is needed unless otherwise documented below in the visit note. 

## 2016-04-07 ENCOUNTER — Encounter: Payer: Self-pay | Admitting: Vascular Surgery

## 2016-04-07 ENCOUNTER — Other Ambulatory Visit: Payer: Self-pay | Admitting: *Deleted

## 2016-04-07 DIAGNOSIS — I83812 Varicose veins of left lower extremities with pain: Secondary | ICD-10-CM

## 2016-05-25 ENCOUNTER — Encounter: Payer: Self-pay | Admitting: Internal Medicine

## 2016-05-25 ENCOUNTER — Ambulatory Visit (INDEPENDENT_AMBULATORY_CARE_PROVIDER_SITE_OTHER): Payer: 59 | Admitting: Internal Medicine

## 2016-05-25 VITALS — BP 122/62 | HR 72 | Wt 161.0 lb

## 2016-05-25 DIAGNOSIS — E559 Vitamin D deficiency, unspecified: Secondary | ICD-10-CM

## 2016-05-25 DIAGNOSIS — E039 Hypothyroidism, unspecified: Secondary | ICD-10-CM

## 2016-05-25 LAB — T4, FREE: Free T4: 0.83 ng/dL (ref 0.60–1.60)

## 2016-05-25 LAB — T3, FREE: T3 FREE: 3.8 pg/mL (ref 2.3–4.2)

## 2016-05-25 LAB — VITAMIN D 25 HYDROXY (VIT D DEFICIENCY, FRACTURES): VITD: 24.65 ng/mL — AB (ref 30.00–100.00)

## 2016-05-25 LAB — TSH: TSH: 1.31 u[IU]/mL (ref 0.35–4.50)

## 2016-05-25 NOTE — Progress Notes (Signed)
Patient ID: Julie Pugh, female   DOB: October 13, 1955, 60 y.o.   MRN: [id number]   HPI  Julie Pugh is a 60 y.o.-year-old female, returning for f/u for hypothyroidism and vitamin D insufficiency. Last visit 6 mo ago.  Pt. has been dx with hypothyroidism in 2012 (foggy mind, weight gain, fatigue); is on Armour 60 mg (equivalent of Levothyroxine 100 mcg), but was off last Summer >> TSH in the Fall of 2016: 3.33 >> repeat TSH 1.83.  She is taking Armour: - fasting - 5-6 am - with water - separated by 30 min-1h from b'fast  - no calcium, iron, PPIs - + multivitamins 4 h later  I reviewed pt's thyroid tests: Lab Results  Component Value Date   TSH 1.31 11/26/2015   TSH 1.83 09/29/2015   TSH 3.33 07/03/2015   TSH 2.9 06/28/2014   FREET4 0.82 11/26/2015    Pt describes: - no fatigue >> feels better - no weight gain - no cold intolerance, + face rash - rosacea >> warm - no depression or anxiety - no constipation - no dry skin - no hair loss  She stopped HRT 02/2015.  Pt denies feeling nodules in neck, hoarseness, dysphagia/odynophagia, SOB with lying down.  She has no FH of thyroid disorders. No FH of thyroid cancer.  No h/o radiation tx to head or neck. No recent use of iodine supplements.  She also has a history of vitamin D def - was off replacement at last visit >> vit D slightly low (vit D insufficiency).  Lab Results  Component Value Date   VD25OH 29.33 (L) 11/26/2015   We started OTC vit D 2000 units daily. She continues on this.  ROS: Constitutional: See history of present illness Eyes: no blurry vision, no xerophthalmia ENT: no sore throat, no nodules palpated in throat, no dysphagia/odynophagia, no hoarseness Cardiovascular: no CP/SOB/palpitations/leg swelling Respiratory: no cough/SOB Gastrointestinal: no N/V/D/C Musculoskeletal: no muscle/joint aches Skin: no rashes Neurological: no tremors/numbness/tingling/dizziness  I reviewed pt's medications,  allergies, PMH, social hx, family hx, and changes were documented in the history of present illness. Otherwise, unchanged from my initial visit note.  Past Medical History:  Diagnosis Date  . Hx of melanoma of skin   . Hyperlipidemia   . Hypothyroidism    Dr. Jaynie Collins managing. armour thyroid 60mg    . Melanoma ([place])    R shin 2009. sees derm q6 months  . Varicose vein    Past Surgical History:  Procedure Laterality Date  . BREAST BIOPSY     benign 2000-stereotactic biopsy  . VARICOSE VEIN SURGERY     2001   Social History Main Topics  . Smoking status: Never Smoker   . Smokeless tobacco: Not on file  . Alcohol Use:     3 Standard drinks or equivalent per week  . Drug Use: No   Social History Narrative   Married (marriage is a stressor). 1 daughter Cloyde Reams at [employer] age 54 in 2016.       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       Hobbies: tennis, walking, time with dogs, pickleball. Reading. Outdoors.    Current Outpatient Prescriptions on File Prior to Visit  Medication Sig Dispense Refill  . ARMOUR THYROID 60 MG tablet TAKE 1 TABLET(60 MG) BY MOUTH DAILY BEFORE BREAKFAST 90 tablet 0  . Bioflavonoid Products (ESTER-C) [id number] MG TABS Take by mouth.    . Cholecalciferol (VITAMIN D) 2000 units tablet Take  2,000 Units by mouth daily.    Marland Kitchen doxycycline (VIBRA-TABS) 100 MG tablet TK 1 T PO  BID  0   No current facility-administered medications on file prior to visit.    No Known Allergies Family History  Problem Relation Age of Onset  . Panic disorder Mother   . COPD Mother     smoked until 22  . Congestive Heart Failure Mother     pacemaker  . Heart attack Father     bypass 26, 88 fatal MI, former smoker  . Hyperlipidemia Sister   . Hypertension Mother    PE: BP 122/62 (BP Location: Left Arm, Patient Position: Sitting)   Pulse 72   Wt 161 lb (73 kg)   SpO2 96%   BMI 25.22 kg/m  Wt Readings from Last 3 Encounters:   05/25/16 161 lb (73 kg)  04/02/16 163 lb 4 oz (74 kg)  11/26/15 164 lb (74.4 kg)   Constitutional: Slightly overweight, in NAD Eyes: PERRLA, EOMI, no exophthalmos ENT: moist mucous membranes, no thyromegaly, no cervical lymphadenopathy Cardiovascular: RRR, No MRG Respiratory: CTA B Gastrointestinal: abdomen soft, NT, ND, BS+ Musculoskeletal: no deformities, strength intact in all 4 Skin: moist, warm, no rashes Neurological: no tremor with outstretched hands, DTR normal in all 4  ASSESSMENT: 1. Hypothyroidism  2. Vitamin D insuffiency  PLAN:  1. Patient with history of mild hypothyroidism, on desiccated thyroid extract. She appears euthyroid. TFTs from 6 mo ago were reviewed and these were normal. - She has an unusual history of being off thyroid hormone for the whole 2016 summer, however, a TSH checked in 06/2015 returned normal, at 3.33. She was then started on the full dose of Armour 60 mg daily (equivalent of 100 g of levothyroxine) and her TSH remained normal (not suppressed), at 1.8. She does not have any side effects of using Armour, and no signs of over replacement. She also does not appear to have a goiter, thyroid nodules, or neck compression symptoms. - At last visit, wediscussed about whether to continue Armour or not, but we decided to continue it for now - We again discussed about correct intake of Armour, fasting, with water, separated by at least 30 minutes from breakfast, and separated by more than 4 hours from calcium, iron, multivitamins, acid reflux medications (PPIs). She is taking it correctly. - will check thyroid tests today: TSH, free T4, free T3 - If these are abnormal, she will need to return in 6 weeks for repeat labs - If these are normal, will recheck them in 6 months  2. Vit D insufficiency - on Vit D3 2000 units daily >> continue - check vit D level today  Component     Latest Ref Rng & Units 05/25/2016  TSH     0.35 - 4.50 uIU/mL 1.31   Triiodothyronine,Free,Serum     2.3 - 4.2 pg/mL 3.8  T4,Free(Direct)     0.60 - 1.60 ng/dL 0.83  VITD     30.00 - 100.00 ng/mL 24.65 (L)   Normal TFTs >> continue current Armour dose. Vit D low on 3000 units daily >> increase to 5000 units daily and check level in 3 months.  Philemon Kingdom, MD PhD [facility] End[facility]

## 2016-05-25 NOTE — Patient Instructions (Addendum)
Please continue Armour 60 mg daily.  Take the thyroid hormone every day, with water, at least 30 minutes before breakfast, separated by at least 4 hours from: - acid reflux medications - calcium - iron - multivitamins  Continue 2000 units vitamin D3 daily.  Please come back for a lab appt in 6 months.  Please return in 1 year.

## 2016-06-02 ENCOUNTER — Encounter (HOSPITAL_COMMUNITY): Payer: 59

## 2016-06-02 ENCOUNTER — Encounter: Payer: 59 | Admitting: Vascular Surgery

## 2016-06-18 ENCOUNTER — Other Ambulatory Visit: Payer: Self-pay

## 2016-06-18 ENCOUNTER — Telehealth: Payer: Self-pay | Admitting: Internal Medicine

## 2016-06-18 MED ORDER — THYROID 60 MG PO TABS: ORAL_TABLET | ORAL | 0 refills | Status: DC

## 2016-06-18 NOTE — Telephone Encounter (Signed)
Pt needs the armour thyroid 60 mg called to [facility] on [address]

## 2016-07-07 ENCOUNTER — Encounter: Payer: Self-pay | Admitting: Vascular Surgery

## 2016-07-14 ENCOUNTER — Encounter: Payer: Self-pay | Admitting: Vascular Surgery

## 2016-07-14 ENCOUNTER — Ambulatory Visit (INDEPENDENT_AMBULATORY_CARE_PROVIDER_SITE_OTHER): Payer: 59 | Admitting: Vascular Surgery

## 2016-07-14 ENCOUNTER — Ambulatory Visit (HOSPITAL_COMMUNITY)
Admission: RE | Admit: 2016-07-14 | Discharge: 2016-07-14 | Disposition: A | Discharge status: Home / Self Care | Payer: 59 | Source: Ambulatory Visit | Attending: Vascular Surgery | Admitting: Vascular Surgery

## 2016-07-14 VITALS — BP 101/80 | HR 64 | Temp 98.4°F | Resp 16 | Ht 67.0 in | Wt 161.0 lb

## 2016-07-14 DIAGNOSIS — I872 Venous insufficiency (chronic) (peripheral): Secondary | ICD-10-CM

## 2016-07-14 DIAGNOSIS — I83812 Varicose veins of left lower extremities with pain: Secondary | ICD-10-CM | POA: Diagnosis not present

## 2016-07-14 NOTE — Progress Notes (Signed)
Patient name: Julie Pugh MRN: [id number] DOB: 05/16/1956 Sex: female  REASON FOR CONSULT: Varicose veins left lower extremity. Referred by Dr. Birdie Riddle.  HPI: Julie Pugh is a 60 y.o. female, with a long history of varicose veins of the left lower extremity. Back in 2001, she had ligation and stripping of the entire left great saphenous vein from the groin to the ankle. She also had stab phlebectomy at that time. This was in [place]. She states that she has had some lymphedema in her left foot since that time. She denies any history of DVT or phlebitis. She plays a lot of tennis and has severe aching pain and heaviness in her left leg especially after tennis which is relieved with elevation. She has worn compression stockings some may do provide some relief.  She is otherwise quite healthy.  Past Medical History:  Diagnosis Date  . Hx of melanoma of skin   . Hyperlipidemia   . Hypothyroidism    Dr. Jaynie Collins managing. armour thyroid 60mg    . Melanoma ([place])    R shin 2009. sees derm q6 months  . Varicose vein     Family History  Problem Relation Age of Onset  . Panic disorder Mother   . COPD Mother     smoked until 34  . Congestive Heart Failure Mother     pacemaker  . Heart attack Father     bypass 30, 59 fatal MI, former smoker  . Hyperlipidemia Sister   . Hypertension Mother     SOCIAL HISTORY: Social History   Social History  . Marital status: Married    Spouse name: N/A  . Number of children: N/A  . Years of education: N/A   Occupational History  . Not on file.   Social History Main Topics  . Smoking status: Never Smoker  . Smokeless tobacco: Not on file  . Alcohol use 2.4 oz/week    4 Standard drinks or equivalent per week  . Drug use: No  . Sexual activity: Yes   Other Topics Concern  . Not on file   Social History Narrative   Married (marriage is a stressor). 1 daughter Cloyde Reams at [employer] age 41 in 2016.       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       Hobbies: tennis, walking, time with dogs, pickleball. Reading. Outdoors.     No Known Allergies  Current Outpatient Prescriptions  Medication Sig Dispense Refill  . Bioflavonoid Products (ESTER-C) [id number] MG TABS Take by mouth.    . Cholecalciferol (VITAMIN D) 2000 units tablet Take 5,000 Units by mouth daily.    Marland Kitchen thyroid (ARMOUR THYROID) 60 MG tablet TAKE 1 TABLET(60 MG) BY MOUTH DAILY BEFORE BREAKFAST 90 tablet 0   No current facility-administered medications for this visit.     REVIEW OF SYSTEMS:  [X]  denotes positive finding, [ ]  denotes negative finding Cardiac  Comments:  Chest pain or chest pressure:    Shortness of breath upon exertion:    Short of breath when lying flat:    Irregular heart rhythm:        Vascular    Pain in calf, thigh, or hip brought on by ambulation:    Pain in feet at night that wakes you up from your sleep:     Blood clot in your veins:    Leg swelling:         Pulmonary  Oxygen at home:    Productive cough:     Wheezing:         Neurologic    Sudden weakness in arms or legs:     Sudden numbness in arms or legs:     Sudden onset of difficulty speaking or slurred speech:    Temporary loss of vision in one eye:     Problems with dizziness:         Gastrointestinal    Blood in stool:     Vomited blood:         Genitourinary    Burning when urinating:     Blood in urine:        Psychiatric    Major depression:         Hematologic    Bleeding problems:    Problems with blood clotting too easily:        Skin    Rashes or ulcers:        Constitutional    Fever or chills:      PHYSICAL EXAM: Vitals:   07/14/16 1158  BP: 101/80  Pulse: 64  Resp: 16  Temp: 98.4 F (36.9 C)  TempSrc: Oral  SpO2: 99%  Weight: 161 lb (73 kg)  Height: 5\' 7"  (1.702 m)    GENERAL: The patient is a well-nourished female, in no acute distress. The vital signs are documented  above. CARDIAC: There is a regular rate and rhythm.  VASCULAR: I do not detect carotid bruits. She has palpable pedal pulses bilaterally. She has mild bilateral lower extreme swelling. She does have a large cluster of varicose veins along the anterior aspect of her left leg which appear to be fed by a perforator on the medial left leg. PULMONARY: There is good air exchange bilaterally without wheezing or rales. ABDOMEN: Soft and non-tender with normal pitched bowel sounds.  MUSCULOSKELETAL: There are no major deformities or cyanosis. NEUROLOGIC: No focal weakness or paresthesias are detected. SKIN: There are no ulcers or rashes noted. PSYCHIATRIC: The patient has a normal affect.  DATA:   LEFT LOWER EXTREMITY VENOUS DUPLEX: I have independently interpreted her left lower extremity venous duplex can. There is no evidence of deep vein thrombosis or superficial thrombophlebitis. There is reflux involving the deep system and in the common femoral vein and popliteal vein. The saphenous femoral junction and great saphenous vein has been ablated. There is no significant reflux in the small saphenous vein.  MEDICAL ISSUES:  PAINFUL VARICOSE VEINS LEFT LOWER EXTREMITY: This patient has a large cluster of varicose veins of the left lower extremity which are fed by an incompetent perforator. She has deep vein reflux involving the common femoral vein and popliteal vein. The great saphenous vein has been removed. We have discussed the importance of intermittent leg elevation and especially after she's been playing tennis. We've discussed the proper positioning for this. I have written her prescription for knee-high compression stockings with a gradient of 15-20 mmHg. I've encouraged her to avoid prolonged sitting and standing. I've encouraged to continue exercising. We also discussed water aerobics which I also think is helpful for people with chronic venous insufficiency. If her symptoms progressed then I  think she could be considered for stab phlebectomy of her large painful varicose veins of the left leg and ligation of the incompetent perforating vein. This would likely need to be done in the operating room.   Deitra Mayo [facility] [phone]

## 2016-07-27 ENCOUNTER — Encounter: Payer: 59 | Admitting: Family Medicine

## 2016-10-12 ENCOUNTER — Other Ambulatory Visit: Payer: Self-pay | Admitting: Internal Medicine

## 2016-11-18 ENCOUNTER — Encounter: Payer: 59 | Admitting: Family Medicine

## 2016-12-27 DIAGNOSIS — M9902 Segmental and somatic dysfunction of thoracic region: Secondary | ICD-10-CM | POA: Diagnosis not present

## 2016-12-27 DIAGNOSIS — M5136 Other intervertebral disc degeneration, lumbar region: Secondary | ICD-10-CM | POA: Diagnosis not present

## 2016-12-27 DIAGNOSIS — M47896 Other spondylosis, lumbar region: Secondary | ICD-10-CM | POA: Diagnosis not present

## 2017-01-24 ENCOUNTER — Other Ambulatory Visit: Payer: Self-pay | Admitting: Family Medicine

## 2017-01-24 DIAGNOSIS — Z1231 Encounter for screening mammogram for malignant neoplasm of breast: Secondary | ICD-10-CM

## 2017-02-07 DIAGNOSIS — M5136 Other intervertebral disc degeneration, lumbar region: Secondary | ICD-10-CM | POA: Diagnosis not present

## 2017-02-07 DIAGNOSIS — D2261 Melanocytic nevi of right upper limb, including shoulder: Secondary | ICD-10-CM | POA: Diagnosis not present

## 2017-02-07 DIAGNOSIS — D225 Melanocytic nevi of trunk: Secondary | ICD-10-CM | POA: Diagnosis not present

## 2017-02-07 DIAGNOSIS — M47896 Other spondylosis, lumbar region: Secondary | ICD-10-CM | POA: Diagnosis not present

## 2017-02-07 DIAGNOSIS — M9902 Segmental and somatic dysfunction of thoracic region: Secondary | ICD-10-CM | POA: Diagnosis not present

## 2017-02-07 DIAGNOSIS — L218 Other seborrheic dermatitis: Secondary | ICD-10-CM | POA: Diagnosis not present

## 2017-02-08 ENCOUNTER — Encounter: Payer: Self-pay | Admitting: Family Medicine

## 2017-02-08 ENCOUNTER — Ambulatory Visit (INDEPENDENT_AMBULATORY_CARE_PROVIDER_SITE_OTHER): Payer: 59 | Admitting: Family Medicine

## 2017-02-08 VITALS — BP 124/81 | HR 79 | Temp 98.6°F | Resp 18 | Ht 67.0 in | Wt 162.5 lb

## 2017-02-08 DIAGNOSIS — R0789 Other chest pain: Secondary | ICD-10-CM | POA: Diagnosis not present

## 2017-02-08 DIAGNOSIS — E785 Hyperlipidemia, unspecified: Secondary | ICD-10-CM | POA: Diagnosis not present

## 2017-02-08 LAB — CBC WITH DIFFERENTIAL/PLATELET
BASOS ABS: 0 10*3/uL (ref 0.0–0.1)
Basophils Relative: 0.8 % (ref 0.0–3.0)
EOS PCT: 1.9 % (ref 0.0–5.0)
Eosinophils Absolute: 0.1 10*3/uL (ref 0.0–0.7)
HCT: 43.7 % (ref 36.0–46.0)
Hemoglobin: 14.8 g/dL (ref 12.0–15.0)
LYMPHS ABS: 1.8 10*3/uL (ref 0.7–4.0)
Lymphocytes Relative: 31.1 % (ref 12.0–46.0)
MCHC: 33.9 g/dL (ref 30.0–36.0)
MCV: 89.3 fl (ref 78.0–100.0)
MONOS PCT: 9.6 % (ref 3.0–12.0)
Monocytes Absolute: 0.6 10*3/uL (ref 0.1–1.0)
Neutro Abs: 3.2 10*3/uL (ref 1.4–7.7)
Neutrophils Relative %: 56.6 % (ref 43.0–77.0)
PLATELETS: 288 10*3/uL (ref 150.0–400.0)
RBC: 4.89 Mil/uL (ref 3.87–5.11)
RDW: 12.7 % (ref 11.5–15.5)
WBC: 5.7 10*3/uL (ref 4.0–10.5)

## 2017-02-08 LAB — HEPATIC FUNCTION PANEL
ALK PHOS: 65 U/L (ref 39–117)
ALT: 13 U/L (ref 0–35)
AST: 14 U/L (ref 0–37)
Albumin: 4.1 g/dL (ref 3.5–5.2)
BILIRUBIN DIRECT: 0.1 mg/dL (ref 0.0–0.3)
Total Bilirubin: 0.6 mg/dL (ref 0.2–1.2)
Total Protein: 7 g/dL (ref 6.0–8.3)

## 2017-02-08 LAB — BASIC METABOLIC PANEL
BUN: 18 mg/dL (ref 6–23)
CALCIUM: 9.8 mg/dL (ref 8.4–10.5)
CO2: 30 meq/L (ref 19–32)
CREATININE: 0.9 mg/dL (ref 0.40–1.20)
Chloride: 105 mEq/L (ref 96–112)
GFR: 67.2 mL/min (ref >60.00)
GLUCOSE: 92 mg/dL (ref 70–99)
Potassium: 5 mEq/L (ref 3.5–5.1)
SODIUM: 140 meq/L (ref 135–145)

## 2017-02-08 LAB — LIPID PANEL
CHOL/HDL RATIO: 4
Cholesterol: 232 mg/dL — ABNORMAL HIGH (ref 0–200)
HDL: 62.2 mg/dL (ref >39.00)
LDL Cholesterol: 145 mg/dL — ABNORMAL HIGH (ref 0–99)
NONHDL: 169.59
TRIGLYCERIDES: 122 mg/dL (ref 0.0–149.0)
VLDL: 24.4 mg/dL (ref 0.0–40.0)

## 2017-02-08 NOTE — Progress Notes (Signed)
   Subjective:    Patient ID: Julie Pugh, female    DOB: 20-Oct-1955, 61 y.o.   MRN: [id number]  HPI L arm pain- pt reports she was in [place] last week walking up 'some pretty steep hills' and developed 'an ache in my lower L arm'.  Did not have pain when walking down hill or on flat surfaces.  Some chest tightness- muscles were TTP.  Saw chiropractor yesterday and he felt that sxs were related to muscle spasm/tightness/pinched nerve but pt is concerned re: family's cardiac hx.  Walked 4 miles yesterday and played tennis over the weekend w/o difficulty.  Denies SOB, diaphoresis, nausea, dizziness.  Hyperlipidemia- pt is not on medication.  Exercising regularly.  Review of Systems For ROS see HPI     Objective:   Physical Exam  Constitutional: She is oriented to person, place, and time. She appears well-developed and well-nourished. No distress.  HENT:  Head: Normocephalic and atraumatic.  Eyes: Conjunctivae and EOM are normal. Pupils are equal, round, and reactive to light.  Neck: Normal range of motion. Neck supple. No thyromegaly present.  Cardiovascular: Normal rate, regular rhythm, normal heart sounds and intact distal pulses.   No murmur heard. Pulmonary/Chest: Effort normal and breath sounds normal. No respiratory distress.  Abdominal: Soft. She exhibits no distension. There is no tenderness.  Musculoskeletal: She exhibits no edema.  Lymphadenopathy:    She has no cervical adenopathy.  Neurological: She is alert and oriented to person, place, and time.  Skin: Skin is warm and dry.  Psychiatric: She has a normal mood and affect. Her behavior is normal.  Vitals reviewed.         Assessment & Plan:

## 2017-02-08 NOTE — Assessment & Plan Note (Signed)
Chronic problem.  Not currently on medication.  Exercising regularly.  Check labs.  Start meds prn.

## 2017-02-08 NOTE — Assessment & Plan Note (Signed)
New.  Pt has seen chiropractor who feels her chest tightness and arm pain are musculoskeletal.  I am in agreement but due to her family cardiac hx will do EKG and get stress test.  Check labs to risk stratify.  Reviewed supportive care and red flags that should prompt return.  Pt expressed understanding and is in agreement w/ plan.   **EDIT** Unable to obtain EKG bc pt used baby oil this AM and after using alcohol and tape we still could not get electrodes to stick

## 2017-02-08 NOTE — Progress Notes (Signed)
Pre visit review using our clinic review tool, if applicable. No additional management support is needed unless otherwise documented below in the visit note. 

## 2017-02-08 NOTE — Patient Instructions (Signed)
Schedule your complete physical in 6 months We'll notify you of your lab results and make any changes if needed We'll call you with your stress test appt I have a low level of suspicion for heart related issues but it is wise to get the stress test Continue to work on healthy diet and regular exercise Call with any questions or concerns Happy [place]!

## 2017-02-09 ENCOUNTER — Other Ambulatory Visit: Payer: Self-pay | Admitting: General Practice

## 2017-02-09 DIAGNOSIS — E785 Hyperlipidemia, unspecified: Secondary | ICD-10-CM

## 2017-02-09 MED ORDER — SIMVASTATIN 10 MG PO TABS: 10.0000 mg | ORAL_TABLET | Freq: Every day | ORAL | 3 refills | Status: DC

## 2017-02-11 ENCOUNTER — Telehealth: Payer: Self-pay | Admitting: *Deleted

## 2017-02-11 NOTE — Telephone Encounter (Signed)
Left message for patient to call to discuss Stress Test.  Test details:  -Feb 15, 2017 3:00pm  - arrive 15 minutes early for check-in  -nothing to eat or drink after 11am  (4 hours prior to test)  -wear comfortable clothing and shoes (preferably tennis-shoes) as if you were dressing to work out.  -If need to reschedule, please call [phone]

## 2017-02-11 NOTE — Telephone Encounter (Signed)
Patient returned call and was given the details listed below. Stated verbal understanding

## 2017-02-14 ENCOUNTER — Other Ambulatory Visit: Payer: Self-pay | Admitting: Internal Medicine

## 2017-02-15 DIAGNOSIS — M5136 Other intervertebral disc degeneration, lumbar region: Secondary | ICD-10-CM | POA: Diagnosis not present

## 2017-02-15 DIAGNOSIS — M47896 Other spondylosis, lumbar region: Secondary | ICD-10-CM | POA: Diagnosis not present

## 2017-02-15 DIAGNOSIS — M9902 Segmental and somatic dysfunction of thoracic region: Secondary | ICD-10-CM | POA: Diagnosis not present

## 2017-02-18 ENCOUNTER — Ambulatory Visit (INDEPENDENT_AMBULATORY_CARE_PROVIDER_SITE_OTHER): Payer: 59

## 2017-02-18 DIAGNOSIS — R0789 Other chest pain: Secondary | ICD-10-CM | POA: Diagnosis not present

## 2017-02-21 ENCOUNTER — Other Ambulatory Visit (INDEPENDENT_AMBULATORY_CARE_PROVIDER_SITE_OTHER): Payer: 59

## 2017-02-21 ENCOUNTER — Telehealth: Payer: Self-pay | Admitting: Family Medicine

## 2017-02-21 DIAGNOSIS — E039 Hypothyroidism, unspecified: Secondary | ICD-10-CM

## 2017-02-21 DIAGNOSIS — E559 Vitamin D deficiency, unspecified: Secondary | ICD-10-CM

## 2017-02-21 LAB — EXERCISE TOLERANCE TEST
CHL RATE OF PERCEIVED EXERTION: 15
CSEPEDS: 0 s
Estimated workload: 7 METS
Exercise duration (min): 6 min
MPHR: 157 {beats}/min
Peak HR: 142 {beats}/min
Percent HR: 90 %
Rest HR: 65 {beats}/min

## 2017-02-21 LAB — T3, FREE: T3 FREE: 4.2 pg/mL (ref 2.3–4.2)

## 2017-02-21 LAB — VITAMIN D 25 HYDROXY (VIT D DEFICIENCY, FRACTURES): VITD: 51.77 ng/mL (ref 30.00–100.00)

## 2017-02-21 LAB — TSH: TSH: 0.97 u[IU]/mL (ref 0.35–4.50)

## 2017-02-21 LAB — T4, FREE: Free T4: 0.74 ng/dL (ref 0.60–1.60)

## 2017-02-21 NOTE — Telephone Encounter (Signed)
Pt calling for results of exercise test she had done on Friday.

## 2017-02-21 NOTE — Telephone Encounter (Signed)
This was resulted on by PCP.

## 2017-02-23 ENCOUNTER — Encounter: Payer: Self-pay | Admitting: Family Medicine

## 2017-02-23 ENCOUNTER — Ambulatory Visit
Admission: RE | Admit: 2017-02-23 | Discharge: 2017-02-23 | Disposition: A | Discharge status: Home / Self Care | Payer: 59 | Source: Ambulatory Visit | Attending: Family Medicine | Admitting: Family Medicine

## 2017-02-23 DIAGNOSIS — Z1231 Encounter for screening mammogram for malignant neoplasm of breast: Secondary | ICD-10-CM | POA: Diagnosis not present

## 2017-03-01 DIAGNOSIS — M9902 Segmental and somatic dysfunction of thoracic region: Secondary | ICD-10-CM | POA: Diagnosis not present

## 2017-03-01 DIAGNOSIS — M47896 Other spondylosis, lumbar region: Secondary | ICD-10-CM | POA: Diagnosis not present

## 2017-03-01 DIAGNOSIS — M5136 Other intervertebral disc degeneration, lumbar region: Secondary | ICD-10-CM | POA: Diagnosis not present

## 2017-05-26 ENCOUNTER — Ambulatory Visit (INDEPENDENT_AMBULATORY_CARE_PROVIDER_SITE_OTHER): Payer: 59 | Admitting: Internal Medicine

## 2017-05-26 ENCOUNTER — Encounter: Payer: Self-pay | Admitting: Internal Medicine

## 2017-05-26 VITALS — BP 130/80 | HR 84 | Wt 160.0 lb

## 2017-05-26 DIAGNOSIS — E785 Hyperlipidemia, unspecified: Secondary | ICD-10-CM | POA: Diagnosis not present

## 2017-05-26 DIAGNOSIS — E039 Hypothyroidism, unspecified: Secondary | ICD-10-CM | POA: Diagnosis not present

## 2017-05-26 DIAGNOSIS — E559 Vitamin D deficiency, unspecified: Secondary | ICD-10-CM | POA: Diagnosis not present

## 2017-05-26 MED ORDER — ARMOUR THYROID 60 MG PO TABS: ORAL_TABLET | ORAL | 3 refills | Status: DC

## 2017-05-26 NOTE — Patient Instructions (Addendum)
Please continue Armour 60 mg daily.  Take the thyroid hormone every day, with water, at least 30 minutes before breakfast, separated by at least 4 hours from: - acid reflux medications - calcium - iron - multivitamins  Please return in 1 year.  Plant-based diet materials: - Lectures (you tube):  Alyssa Grove: "Breaking the Food Seduction"  Doug Lisle: "How to Lose Weight, without Losing Your Mind" - Documentaries:  Supersize Me  What the [place] over [employer], Sick and Nearly Dead  The [employer]  Overweight and undernourished - Books:  Alyssa Grove: "Program for Reversing Diabetes"  Alyssa Grove: "The [employer]"  Legrand Como Greger: "How Not to Die"  Heath Gold: "The [place] Study"  Norma Fredrickson: "Supermarket Vegan" (cookbook) - Facebook pages:   Moshe Salisbury versus Knives  Nutrition facts  Vegucated  [place]

## 2017-05-26 NOTE — Progress Notes (Signed)
Patient ID: Julie Pugh, female   DOB: [birth year], 61 y.o.   MRN: [id number]   HPI  Julie Pugh is a 61 y.o.-year-old female, returning for f/u for hypothyroidism and vitamin D insufficiency. Last visit 1 year ago.  Pt. has been dx with hypothyroidism in 2012 (foggy mind, weight gain, fatigue); is on Armour 60 mg (equivalent of Levothyroxine 100 mcg), but was off 2016 Summer >> TSH in the Fall of 2016: 3.33 >> repeat TSH 1.83.  Pt is on Armour 60 mg daily, taken: - in am - fasting - at least 30 min from b'fast - no Ca, Fe, PPIs - not on Biotin  I reviewed pt's thyroid tests: Lab Results  Component Value Date   TSH 0.97 02/21/2017   TSH 1.31 05/25/2016   TSH 1.31 11/26/2015   TSH 1.83 09/29/2015   TSH 3.33 07/03/2015   TSH 2.9 06/28/2014   FREET4 0.74 02/21/2017   FREET4 0.83 05/25/2016   FREET4 0.82 11/26/2015    Pt denies: - feeling nodules in neck - hoarseness - dysphagia - choking - SOB with lying down  She has no FH of thyroid disorders.No FH of thyroid cancer. No h/o radiation tx to head or neck.  No seaweed or kelp. No recent contrast studies. No herbal supplements. No Biotin use. No recent steroids use.   She also has a history of vitamin D def:  Lab Results  Component Value Date   VD25OH 51.77 02/21/2017   VD25OH 24.65 (L) 05/25/2016   VD25OH 29.33 (L) 11/26/2015   She is taking OTC vit D 5000 IU daily.  She stopped HRT 02/2015.  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 She refused to restart a statin. She walks and plays tennis. Eats little meat, but does eat icecream, cheese.  ROS: Constitutional: no weight gain/no weight loss, no fatigue, no subjective hyperthermia, no subjective hypothermia Eyes: no blurry vision, no xerophthalmia ENT: no sore throat, no nodules palpated in throat, no dysphagia, no odynophagia, no hoarseness Cardiovascular: no CP/no SOB/no palpitations/no leg swelling Respiratory: no cough/no SOB/no wheezing Gastrointestinal:  no N/no V/no D/no C/no acid reflux Musculoskeletal: + muscle aches/no joint aches Skin: no rashes, no hair loss Neurological: no tremors/no numbness/no tingling/no dizziness  I reviewed pt's medications, allergies, PMH, social hx, family hx, and changes were documented in the history of present illness. Otherwise, unchanged from my initial visit note.  Past Medical History:  Diagnosis Date  . Hx of melanoma of skin   . Hyperlipidemia   . Hypothyroidism    Dr. Jaynie Collins managing. armour thyroid 60mg    . Melanoma ([place])    R shin 2009. sees derm q6 months  . Varicose vein    Past Surgical History:  Procedure Laterality Date  . BREAST BIOPSY     benign 2000-stereotactic biopsy  . BREAST BIOPSY Right 03/12/2004  . VARICOSE VEIN SURGERY     2001   Social History Main Topics  . Smoking status: Never Smoker   . Smokeless tobacco: Not on file  . Alcohol Use:     3 Standard drinks or equivalent per week  . Drug Use: No   Social History Narrative   Married (marriage is a stressor). 1 daughter Cloyde Reams at [employer] age 63 in 2016.       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       Hobbies: tennis, walking, time with dogs, pickleball. Reading. Outdoors.    Current Outpatient  Prescriptions on File Prior to Visit  Medication Sig Dispense Refill  . ARMOUR THYROID 60 MG tablet TAKE 1 TABLET(60 MG) BY MOUTH DAILY BEFORE BREAKFAST 90 tablet 0  . Bioflavonoid Products (ESTER-C) 099-833-82 MG TABS Take by mouth.    . Cholecalciferol (VITAMIN D) 2000 units tablet Take 5,000 Units by mouth daily.    . simvastatin (ZOCOR) 10 MG tablet Take 1 tablet (10 mg total) by mouth at bedtime. (Patient not taking: Reported on 05/26/2017) 30 tablet 3   No current facility-administered medications on file prior to visit.    No Known Allergies Family History  Problem Relation Age of Onset  . Heart attack Father        bypass 27, 7 fatal MI, former smoker  . Panic  disorder Mother   . COPD Mother        smoked until 44  . Congestive Heart Failure Mother        pacemaker  . Hypertension Mother   . Hyperlipidemia Sister    PE: BP 130/80 (BP Location: Left Arm, Patient Position: Sitting)   Pulse 84   Wt 160 lb (72.6 kg)   SpO2 96%   BMI 25.06 kg/m  Wt Readings from Last 3 Encounters:  05/26/17 160 lb (72.6 kg)  02/08/17 162 lb 8 oz (73.7 kg)  07/14/16 161 lb (73 kg)   Constitutional: normal weight, in NAD Eyes: PERRLA, EOMI, no exophthalmos ENT: moist mucous membranes, no thyromegaly, no cervical lymphadenopathy Cardiovascular: RRR, No MRG Respiratory: CTA B Gastrointestinal: abdomen soft, NT, ND, BS+ Musculoskeletal: no deformities, strength intact in all 4 Skin: moist, warm, no rashes Neurological: no tremor with outstretched hands, DTR normal in all 4  ASSESSMENT: 1. Hypothyroidism  2. Vitamin D insufficiency  3. HL  PLAN:  1. Patient with history of  Mild hypothyroidism, on desiccated thyroid extract. She has an unusual history of being off thyroid hormones for the whole 2016 summer, however, a TSH checked 06/2015 returned normal, at 3.33. She was then started on a full dose of Armour, 60 mg daily (equivalent of 100 g of levothyroxine) and her TSH remained normal. - latest thyroid labs reviewed with pt >> normal 3 mo ago - she continues on Armor 60 mg daily >> will refills - pt feels good on this dose. - we discussed about taking the thyroid hormone every day, with water, >30 minutes before breakfast, separated by >4 hours from acid reflux medications, calcium, iron, multivitamins. Pt. is taking it correctly - RTC in 1 year  2. Vit D insufficiency - on Vit D3 5000 units daily, increased at last visit - We reviewed her vitamin D level from 02/2017 and this was normal, therefore, we'll will not recheck a level today  3. HL - we discussed about cutting out dairy products and given referrences  Philemon Kingdom, MD PhD [facility]  Endocrinology

## 2017-07-19 DIAGNOSIS — M9902 Segmental and somatic dysfunction of thoracic region: Secondary | ICD-10-CM | POA: Diagnosis not present

## 2017-07-19 DIAGNOSIS — M47896 Other spondylosis, lumbar region: Secondary | ICD-10-CM | POA: Diagnosis not present

## 2017-07-19 DIAGNOSIS — M5136 Other intervertebral disc degeneration, lumbar region: Secondary | ICD-10-CM | POA: Diagnosis not present

## 2017-08-01 DIAGNOSIS — M9902 Segmental and somatic dysfunction of thoracic region: Secondary | ICD-10-CM | POA: Diagnosis not present

## 2017-08-01 DIAGNOSIS — M5136 Other intervertebral disc degeneration, lumbar region: Secondary | ICD-10-CM | POA: Diagnosis not present

## 2017-08-01 DIAGNOSIS — M47896 Other spondylosis, lumbar region: Secondary | ICD-10-CM | POA: Diagnosis not present

## 2017-08-16 ENCOUNTER — Encounter: Payer: 59 | Admitting: Family Medicine

## 2017-09-19 ENCOUNTER — Telehealth: Payer: Self-pay | Admitting: Family Medicine

## 2017-09-19 NOTE — Telephone Encounter (Signed)
Copied from [place] [id number]. Topic: Quick Communication - See Telephone Encounter >> Sep 19, 2017  1:09 PM Cleaster Corin, NT wrote: CRM for notification. See Telephone encounter for:   09/19/17. Pt calling to reschedule CPE with Dr. Birdie Riddle. CPE was scheduled for Oct. But pt. Canceled. Pt would like to reschedule after christmas but before the year is out notified her that there was nothing open the week of the 17Th. Pt.wants to change doctors if she can not get in to see Dr. Birdie Riddle. Let pt. Know pcp usually does CPE please give pt. A call if Dr. Birdie Riddle is able to fit pt. Into her schedule

## 2017-09-27 NOTE — Progress Notes (Signed)
Patient presents to clinic today for annual exam.  Patient is fasting for labs. Body mass index is 25.14 kg/m. Patient endorses a well-balanced diet. Is walking her dog 1-2 miles daily, also jogs 5 miles twice per week and plays tennis several times per week when able.  Patient with a history of hyperlipidemia, prescribed simvastatin. Is not taking as directed. States she worked on diet and exercise instead. Has hypothyroidism, currently managed by Endocrinology (Dr. Cruzita Lederer).  Health Maintenance: Immunizations -- Declines today. Colonoscopy -- up-to-date Mammogram -- up-to-date PAP -- up-to-date  Past Medical History:  Diagnosis Date  . Hx of melanoma of skin   . Hyperlipidemia   . Hypothyroidism    Dr. Jaynie Collins managing. armour thyroid 60mg    . Melanoma ([place])    R shin 2009. sees derm q6 months  . Varicose vein     Past Surgical History:  Procedure Laterality Date  . BREAST BIOPSY     benign 2000-stereotactic biopsy  . BREAST BIOPSY Right 03/12/2004  . VARICOSE VEIN SURGERY     2001    Current Outpatient Medications on File Prior to Visit  Medication Sig Dispense Refill  . ARMOUR THYROID 60 MG tablet TAKE 1 TABLET(60 MG) BY MOUTH DAILY BEFORE BREAKFAST 90 tablet 3  . Bioflavonoid Products (ESTER-C) 097-353-29 MG TABS Take by mouth.    . Cholecalciferol (VITAMIN D) 2000 units tablet Take 5,000 Units by mouth daily.     No current facility-administered medications on file prior to visit.     No Known Allergies  Family History  Problem Relation Age of Onset  . Heart attack Father        bypass 73, 46 fatal MI, former smoker  . Panic disorder Mother   . COPD Mother        smoked until 75  . Congestive Heart Failure Mother        pacemaker  . Hypertension Mother   . Hyperlipidemia Sister     Social History   Socioeconomic History  . Marital status: Married    Spouse name: Not on file  . Number of children: Not on file  . Years of education: Not on file    . Highest education level: Not on file  Social Needs  . Financial resource strain: Not on file  . Food insecurity - worry: Not on file  . Food insecurity - inability: Not on file  . Transportation needs - medical: Not on file  . Transportation needs - non-medical: Not on file  Occupational History  . Not on file  Tobacco Use  . Smoking status: Never Smoker  . Smokeless tobacco: Never Used  Substance and Sexual Activity  . Alcohol use: Yes    Alcohol/week: 2.4 oz    Types: 4 Standard drinks or equivalent per week  . Drug use: No  . Sexual activity: Yes  Other Topics Concern  . Not on file  Social History Narrative   Married (marriage is a stressor). 1 daughter Cloyde Reams at [employer] age 70 in 2016.       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       Hobbies: tennis, walking, time with dogs, pickleball. Reading. Outdoors.    Review of Systems  Constitutional: Negative for fever and weight loss.  HENT: Negative for ear discharge, ear pain, hearing loss and tinnitus.   Eyes: Negative for blurred vision, double vision, photophobia and pain.  Respiratory: Negative for cough  and shortness of breath.   Cardiovascular: Negative for chest pain and palpitations.  Gastrointestinal: Negative for abdominal pain, blood in stool, constipation, diarrhea, heartburn, melena, nausea and vomiting.  Genitourinary: Negative for dysuria, flank pain, frequency, hematuria and urgency.  Musculoskeletal: Negative for falls.  Neurological: Negative for dizziness, loss of consciousness and headaches.  Endo/Heme/Allergies: Negative for environmental allergies.  Psychiatric/Behavioral: Negative for depression, hallucinations, substance abuse and suicidal ideas. The patient is not nervous/anxious and does not have insomnia.    BP 121/80   Pulse 78   Temp 98.2 F (36.8 C) (Oral)   Resp 16   Ht 5\' 7"  (1.702 m)   Wt 160 lb 8 oz (72.8 kg)   SpO2 98%   BMI 25.14  kg/m   Physical Exam  Constitutional: She is oriented to person, place, and time and well-developed, well-nourished, and in no distress.  HENT:  Head: Normocephalic and atraumatic.  Right Ear: Tympanic membrane, external ear and ear canal normal.  Left Ear: Tympanic membrane, external ear and ear canal normal.  Nose: Nose normal. No mucosal edema.  Mouth/Throat: Uvula is midline, oropharynx is clear and moist and mucous membranes are normal. No oropharyngeal exudate or posterior oropharyngeal erythema.  Eyes: Conjunctivae are normal. Pupils are equal, round, and reactive to light.  Neck: Neck supple. No thyromegaly present.  Cardiovascular: Normal rate, regular rhythm, normal heart sounds and intact distal pulses.  Pulmonary/Chest: Effort normal and breath sounds normal. No respiratory distress. She has no wheezes. She has no rales.  Abdominal: Soft. Bowel sounds are normal. She exhibits no distension and no mass. There is no tenderness. There is no rebound and no guarding.  Lymphadenopathy:    She has no cervical adenopathy.  Neurological: She is alert and oriented to person, place, and time. No cranial nerve deficit.  Skin: Skin is warm and dry. No rash noted.  Psychiatric: Affect normal.  Vitals reviewed.   Assessment/Plan: 1. Hypothyroidism, unspecified type Repeat TSH level today. Will forward any abnormal findings to Endocrinologist. - TSH  2. Hyperlipidemia, unspecified hyperlipidemia type Working on diet and exercise. Repeat fasting lipids today. - Comprehensive metabolic panel - Lipid panel  3. Vitamin D deficiency Repeat vitamin D level. - Vitamin D (25 hydroxy)  4. Visit for preventive health examination Depression screen negative. Health Maintenance reviewed -- Declines immunizations. Preventive schedule discussed and handout given in AVS. Will obtain fasting labs today.  - CBC with Differential/Platelet - Hemoglobin A1c   Leeanne Rio, [place]

## 2017-09-28 ENCOUNTER — Encounter: Payer: Self-pay | Admitting: Physician Assistant

## 2017-09-28 ENCOUNTER — Other Ambulatory Visit: Payer: Self-pay

## 2017-09-28 ENCOUNTER — Ambulatory Visit (INDEPENDENT_AMBULATORY_CARE_PROVIDER_SITE_OTHER): Payer: 59 | Admitting: Physician Assistant

## 2017-09-28 ENCOUNTER — Encounter: Payer: Self-pay | Admitting: General Practice

## 2017-09-28 VITALS — BP 121/80 | HR 78 | Temp 98.2°F | Resp 16 | Ht 67.0 in | Wt 160.5 lb

## 2017-09-28 DIAGNOSIS — E039 Hypothyroidism, unspecified: Secondary | ICD-10-CM | POA: Diagnosis not present

## 2017-09-28 DIAGNOSIS — Z0000 Encounter for general adult medical examination without abnormal findings: Secondary | ICD-10-CM | POA: Diagnosis not present

## 2017-09-28 DIAGNOSIS — E559 Vitamin D deficiency, unspecified: Secondary | ICD-10-CM | POA: Diagnosis not present

## 2017-09-28 DIAGNOSIS — E785 Hyperlipidemia, unspecified: Secondary | ICD-10-CM | POA: Diagnosis not present

## 2017-09-28 LAB — CBC WITH DIFFERENTIAL/PLATELET
Basophils Absolute: 0 10*3/uL (ref 0.0–0.1)
Basophils Relative: 0.9 % (ref 0.0–3.0)
EOS ABS: 0.1 10*3/uL (ref 0.0–0.7)
Eosinophils Relative: 2.9 % (ref 0.0–5.0)
HEMATOCRIT: 44.2 % (ref 36.0–46.0)
HEMOGLOBIN: 14.8 g/dL (ref 12.0–15.0)
LYMPHS PCT: 34.3 % (ref 12.0–46.0)
Lymphs Abs: 1.6 10*3/uL (ref 0.7–4.0)
MCHC: 33.6 g/dL (ref 30.0–36.0)
MCV: 90.3 fl (ref 78.0–100.0)
Monocytes Absolute: 0.5 10*3/uL (ref 0.1–1.0)
Monocytes Relative: 11.6 % (ref 3.0–12.0)
Neutro Abs: 2.3 10*3/uL (ref 1.4–7.7)
Neutrophils Relative %: 50.3 % (ref 43.0–77.0)
Platelets: 293 10*3/uL (ref 150.0–400.0)
RBC: 4.9 Mil/uL (ref 3.87–5.11)
RDW: 12.4 % (ref 11.5–15.5)
WBC: 4.6 10*3/uL (ref 4.0–10.5)

## 2017-09-28 LAB — COMPREHENSIVE METABOLIC PANEL
ALT: 14 U/L (ref 0–35)
AST: 14 U/L (ref 0–37)
Albumin: 4.2 g/dL (ref 3.5–5.2)
Alkaline Phosphatase: 64 U/L (ref 39–117)
BILIRUBIN TOTAL: 0.6 mg/dL (ref 0.2–1.2)
BUN: 18 mg/dL (ref 6–23)
CALCIUM: 9.4 mg/dL (ref 8.4–10.5)
CO2: 29 meq/L (ref 19–32)
CREATININE: 0.82 mg/dL (ref 0.40–1.20)
Chloride: 105 mEq/L (ref 96–112)
GFR: 74.67 mL/min (ref >60.00)
Glucose, Bld: 90 mg/dL (ref 70–99)
Potassium: 4.3 mEq/L (ref 3.5–5.1)
Sodium: 139 mEq/L (ref 135–145)
Total Protein: 7.2 g/dL (ref 6.0–8.3)

## 2017-09-28 LAB — VITAMIN D 25 HYDROXY (VIT D DEFICIENCY, FRACTURES): VITD: 35.51 ng/mL (ref 30.00–100.00)

## 2017-09-28 LAB — LIPID PANEL
CHOL/HDL RATIO: 3
CHOLESTEROL: 198 mg/dL (ref 0–200)
HDL: 57.6 mg/dL (ref >39.00)
LDL CALC: 122 mg/dL — AB (ref 0–99)
NonHDL: 140.15
TRIGLYCERIDES: 89 mg/dL (ref 0.0–149.0)
VLDL: 17.8 mg/dL (ref 0.0–40.0)

## 2017-09-28 LAB — HEMOGLOBIN A1C: Hgb A1c MFr Bld: 5.6 % (ref 4.6–6.5)

## 2017-09-28 LAB — TSH: TSH: 2.63 u[IU]/mL (ref 0.35–4.50)

## 2017-09-28 NOTE — Patient Instructions (Signed)
Please go to the lab for blood work.   Our office will call you with your results unless you have chosen to receive results via MyChart.  If your blood work is normal we will follow-up each year for physicals and as scheduled for chronic medical problems.  If anything is abnormal we will treat accordingly and get you in for a follow-up.  Please continue chronic medications as directed. Add on a turmeric supplement (500 mg) daily. -- check online for the NOW vitamins and [email or web address].   If shoulder not improving with supportive measures discussed, please let me know.   Preventive Care 40-64 Years, Female Preventive care refers to lifestyle choices and visits with your health care provider that can promote health and wellness. What does preventive care include?  A yearly physical exam. This is also called an annual well check.  Dental exams once or twice a year.  Routine eye exams. Ask your health care provider how often you should have your eyes checked.  Personal lifestyle choices, including: ? Daily care of your teeth and gums. ? Regular physical activity. ? Eating a healthy diet. ? Avoiding tobacco and drug use. ? Limiting alcohol use. ? Practicing safe sex. ? Taking low-dose aspirin daily starting at age 13. ? Taking vitamin and mineral supplements as recommended by your health care provider. What happens during an annual well check? The services and screenings done by your health care provider during your annual well check will depend on your age, overall health, lifestyle risk factors, and family history of disease. Counseling Your health care provider may ask you questions about your:  Alcohol use.  Tobacco use.  Drug use.  Emotional well-being.  Home and relationship well-being.  Sexual activity.  Eating habits.  Work and work Statistician.  Method of birth control.  Menstrual cycle.  Pregnancy history.  Screening You may have the following  tests or measurements:  Height, weight, and BMI.  Blood pressure.  Lipid and cholesterol levels. These may be checked every 5 years, or more frequently if you are over 39 years old.  Skin check.  Lung cancer screening. You may have this screening every year starting at age 27 if you have a 30-pack-year history of smoking and currently smoke or have quit within the past 15 years.  Fecal occult blood test (FOBT) of the stool. You may have this test every year starting at age 41.  Flexible sigmoidoscopy or colonoscopy. You may have a sigmoidoscopy every 5 years or a colonoscopy every 10 years starting at age 82.  Hepatitis C blood test.  Hepatitis B blood test.  Sexually transmitted disease (STD) testing.  Diabetes screening. This is done by checking your blood sugar (glucose) after you have not eaten for a while (fasting). You may have this done every 1-3 years.  Mammogram. This may be done every 1-2 years. Talk to your health care provider about when you should start having regular mammograms. This may depend on whether you have a family history of breast cancer.  BRCA-related cancer screening. This may be done if you have a family history of breast, ovarian, tubal, or peritoneal cancers.  Pelvic exam and Pap test. This may be done every 3 years starting at age 15. Starting at age 65, this may be done every 5 years if you have a Pap test in combination with an HPV test.  Bone density scan. This is done to screen for osteoporosis. You may have this scan if you are at high  risk for osteoporosis.  Discuss your test results, treatment options, and if necessary, the need for more tests with your health care provider. Vaccines Your health care provider may recommend certain vaccines, such as:  Influenza vaccine. This is recommended every year.  Tetanus, diphtheria, and acellular pertussis (Tdap, Td) vaccine. You may need a Td booster every 10 years.  Varicella vaccine. You may need  this if you have not been vaccinated.  Zoster vaccine. You may need this after age 68.  Measles, mumps, and rubella (MMR) vaccine. You may need at least one dose of MMR if you were born in 1957 or later. You may also need a second dose.  Pneumococcal 13-valent conjugate (PCV13) vaccine. You may need this if you have certain conditions and were not previously vaccinated.  Pneumococcal polysaccharide (PPSV23) vaccine. You may need one or two doses if you smoke cigarettes or if you have certain conditions.  Meningococcal vaccine. You may need this if you have certain conditions.  Hepatitis A vaccine. You may need this if you have certain conditions or if you travel or work in places where you may be exposed to hepatitis A.  Hepatitis B vaccine. You may need this if you have certain conditions or if you travel or work in places where you may be exposed to hepatitis B.  Haemophilus influenzae type b (Hib) vaccine. You may need this if you have certain conditions.  Talk to your health care provider about which screenings and vaccines you need and how often you need them. This information is not intended to replace advice given to you by your health care provider. Make sure you discuss any questions you have with your health care provider. Document Released: 10/24/2015 Document Revised: 06/16/2016 Document Reviewed: 07/29/2015 Elsevier Interactive Patient Education  [employer].

## 2017-11-17 ENCOUNTER — Ambulatory Visit: Payer: 59 | Admitting: Family Medicine

## 2017-11-17 ENCOUNTER — Encounter: Payer: Self-pay | Admitting: Family Medicine

## 2017-11-17 ENCOUNTER — Other Ambulatory Visit: Payer: Self-pay

## 2017-11-17 VITALS — BP 110/68 | HR 80 | Temp 98.9°F | Resp 16 | Ht 67.0 in | Wt 161.1 lb

## 2017-11-17 DIAGNOSIS — R110 Nausea: Secondary | ICD-10-CM

## 2017-11-17 DIAGNOSIS — K30 Functional dyspepsia: Secondary | ICD-10-CM

## 2017-11-17 MED ORDER — GI COCKTAIL ~~LOC~~
30.0000 mL | Freq: Once | ORAL | Status: AC
  Administered 2017-11-17: 30 mL via ORAL

## 2017-11-17 MED ORDER — PANTOPRAZOLE SODIUM 40 MG PO TBEC: 40.0000 mg | DELAYED_RELEASE_TABLET | Freq: Every day | ORAL | 3 refills | Status: DC

## 2017-11-17 MED ORDER — SUCRALFATE 1 G PO TABS: 1.0000 g | ORAL_TABLET | Freq: Three times a day (TID) | ORAL | 0 refills | Status: DC

## 2017-11-17 MED ORDER — ONDANSETRON HCL 4 MG PO TABS: 4.0000 mg | ORAL_TABLET | Freq: Three times a day (TID) | ORAL | 0 refills | Status: DC | PRN

## 2017-11-17 NOTE — Progress Notes (Signed)
   Subjective:    Patient ID: Desa Rech, female    DOB: 15-Oct-1955, 62 y.o.   MRN: [id number]  HPI Nausea- no vomiting or abdominal pain.  sxs started w/ nausea on 2/2 after eating [place] the night before.  + indigestion- increased belching and abdominal distension.  Having formed BMs (green after eating cupcake w/ blue icing).  Nausea improves w/ eating worsens on empty stomach.  Taking probiotics.  No fevers.     Review of Systems For ROS see HPI     Objective:   Physical Exam  Constitutional: She is oriented to person, place, and time. She appears well-developed and well-nourished. No distress.  HENT:  Head: Normocephalic and atraumatic.  MMM  Neck: Neck supple.  Cardiovascular: Normal rate, regular rhythm and intact distal pulses.  Pulmonary/Chest: Effort normal and breath sounds normal. No respiratory distress. She has no wheezes. She has no rales.  Abdominal: Soft. She exhibits no distension. There is no tenderness. There is no rebound.  Hyperactive BS  Lymphadenopathy:    She has no cervical adenopathy.  Neurological: She is alert and oriented to person, place, and time.  Skin: Skin is warm and dry.  Vitals reviewed.         Assessment & Plan:  Nausea/indigestion- new.  Suspect pt has gastritis and increased acid production.  sxs improved s/p GI cocktail in office.  Start PPI.  Offered Zofran and Carafate but pt declined.  Reviewed dietary and lifestyle changes that will improve sxs.  Reviewed supportive care and red flags that should prompt return.  Pt expressed understanding and is in agreement w/ plan.

## 2017-11-17 NOTE — Patient Instructions (Signed)
Follow up as needed or as scheduled START the Pantoprazole once daily to decrease the acid production Avoid an empty stomach- graze throughout the day Avoid acidic or spicy foods, alcohol and caffeine to improve the indigestion Call with any questions or concerns Hang in there!

## 2018-01-30 DIAGNOSIS — M5136 Other intervertebral disc degeneration, lumbar region: Secondary | ICD-10-CM | POA: Diagnosis not present

## 2018-01-30 DIAGNOSIS — M47896 Other spondylosis, lumbar region: Secondary | ICD-10-CM | POA: Diagnosis not present

## 2018-01-30 DIAGNOSIS — M9902 Segmental and somatic dysfunction of thoracic region: Secondary | ICD-10-CM | POA: Diagnosis not present

## 2018-02-16 ENCOUNTER — Other Ambulatory Visit: Payer: Self-pay | Admitting: Family Medicine

## 2018-02-16 DIAGNOSIS — Z1231 Encounter for screening mammogram for malignant neoplasm of breast: Secondary | ICD-10-CM

## 2018-03-15 ENCOUNTER — Ambulatory Visit
Admission: RE | Admit: 2018-03-15 | Discharge: 2018-03-15 | Disposition: A | Discharge status: Home / Self Care | Payer: 59 | Source: Ambulatory Visit | Attending: Family Medicine | Admitting: Family Medicine

## 2018-03-15 DIAGNOSIS — Z1231 Encounter for screening mammogram for malignant neoplasm of breast: Secondary | ICD-10-CM | POA: Diagnosis not present

## 2018-03-27 DIAGNOSIS — D485 Neoplasm of uncertain behavior of skin: Secondary | ICD-10-CM | POA: Diagnosis not present

## 2018-03-27 DIAGNOSIS — L718 Other rosacea: Secondary | ICD-10-CM | POA: Diagnosis not present

## 2018-03-27 DIAGNOSIS — D225 Melanocytic nevi of trunk: Secondary | ICD-10-CM | POA: Diagnosis not present

## 2018-03-27 DIAGNOSIS — D2262 Melanocytic nevi of left upper limb, including shoulder: Secondary | ICD-10-CM | POA: Diagnosis not present

## 2018-03-29 ENCOUNTER — Ambulatory Visit: Payer: 59 | Admitting: Family Medicine

## 2018-05-08 ENCOUNTER — Telehealth: Payer: Self-pay | Admitting: Emergency Medicine

## 2018-05-08 ENCOUNTER — Encounter: Payer: Self-pay | Admitting: Internal Medicine

## 2018-05-08 NOTE — Telephone Encounter (Signed)
Please advise on below  

## 2018-05-08 NOTE — Telephone Encounter (Signed)
Pt called and stated she has a lump on the side of her neck. Pt stated she believes her thyroid might be enlarged. She has an appt on 05/30/18 and wants to know if she can get in sooner even if its with a different provider. Please advise on whats best and ill give her a call back thanks.

## 2018-05-08 NOTE — Telephone Encounter (Signed)
I do not have a thyroid ultrasound for her on file.  However, the lump could be any structure in the neck, from a muscle knot, to a salivary gland, to lymph nodes to the thyroid itself.  I would suggest to either see her PCP, who can sort this out and may be order a neck ultrasound if needed, or to schedule with 1 of my colleagues, if she feels this needs to be checked more urgently then 3 weeks from now when she has an appointment with me.

## 2018-05-08 NOTE — Telephone Encounter (Signed)
Just sent a message to [place] about this.

## 2018-05-09 ENCOUNTER — Encounter: Payer: Self-pay | Admitting: Internal Medicine

## 2018-05-09 NOTE — Telephone Encounter (Signed)
Spoke with Dr. Dwyane Dee and explained to him the situation. I explained that Dr. Cruzita Lederer stated that maybe the pt should call their primary, but Dr. Dwyane Dee said since the pt is already scheduled, he would go ahead and see her.

## 2018-05-09 NOTE — Telephone Encounter (Signed)
Called patient this morning and let patient know. She asked to see provider in this office. Scheduled with Dr Dwyane Dee tomorrow 7/31 at 1:30 pm. thnaks.

## 2018-05-10 ENCOUNTER — Ambulatory Visit: Payer: 59 | Admitting: Endocrinology

## 2018-05-10 ENCOUNTER — Encounter: Payer: Self-pay | Admitting: Endocrinology

## 2018-05-10 ENCOUNTER — Ambulatory Visit
Admission: RE | Admit: 2018-05-10 | Discharge: 2018-05-10 | Disposition: A | Discharge status: Home / Self Care | Payer: 59 | Source: Ambulatory Visit | Attending: Endocrinology | Admitting: Endocrinology

## 2018-05-10 VITALS — BP 108/64 | HR 79 | Ht 67.0 in | Wt 159.6 lb

## 2018-05-10 DIAGNOSIS — E039 Hypothyroidism, unspecified: Secondary | ICD-10-CM | POA: Diagnosis not present

## 2018-05-10 DIAGNOSIS — M19011 Primary osteoarthritis, right shoulder: Secondary | ICD-10-CM | POA: Diagnosis not present

## 2018-05-10 DIAGNOSIS — M89319 Hypertrophy of bone, unspecified shoulder: Secondary | ICD-10-CM

## 2018-05-10 LAB — T3, FREE: T3 FREE: 3.8 pg/mL (ref 2.3–4.2)

## 2018-05-10 LAB — TSH: TSH: 1.58 u[IU]/mL (ref 0.35–4.50)

## 2018-05-10 LAB — T4, FREE: Free T4: 0.75 ng/dL (ref 0.60–1.60)

## 2018-05-10 LAB — ALKALINE PHOSPHATASE: ALK PHOS: 64 U/L (ref 39–117)

## 2018-05-10 NOTE — Progress Notes (Signed)
Patient ID: Julie Pugh, female   DOB: 1956/02/24, 62 y.o.   MRN: [id number]          Chief complaint: Lump on the neck   History of Present Illness:  Last weekend she was told by her daughter that she has a lump on her neck on the right side Patient has not noticed this Has not had no local discomfort Patient thought that this was related to her thyroid and wanted to be examined She does not have a history of goiter in the past with her hypothyroidism    Allergies as of 05/10/2018   No Known Allergies     Medication List        Accurate as of 05/10/18  2:09 PM. Always use your most recent med list.          ARMOUR THYROID 60 MG tablet Generic drug:  thyroid TAKE 1 TABLET(60 MG) BY MOUTH DAILY BEFORE BREAKFAST   ESTER-C 500-200-60 MG Tabs Take by mouth.   Vitamin D 2000 units tablet Take 5,000 Units by mouth daily.       Allergies: No Known Allergies  Past Medical History:  Diagnosis Date  . Hx of melanoma of skin   . Hyperlipidemia   . Hypothyroidism    Dr. Jaynie Collins managing. armour thyroid 60mg    . Melanoma ([place])    R shin 2009. sees derm q6 months  . Varicose vein     Past Surgical History:  Procedure Laterality Date  . BREAST BIOPSY     benign 2000-stereotactic biopsy  . BREAST BIOPSY Right 03/12/2004  . VARICOSE VEIN SURGERY     2001    Family History  Problem Relation Age of Onset  . Heart attack Father        bypass 80, 66 fatal MI, former smoker  . Panic disorder Mother   . COPD Mother        smoked until 54  . Congestive Heart Failure Mother        pacemaker  . Hypertension Mother   . Hyperlipidemia Sister     Social History:  reports that she has never smoked. She has never used smokeless tobacco. She reports that she drinks about 2.4 oz of alcohol per week. She reports that she does not use drugs.   No visits with results within 1 Week(s) from this visit.  Latest known visit with results is:  Office Visit on 09/28/2017  Component  Date Value Ref Range Status  . WBC 09/28/2017 4.6  4.0 - 10.5 K/uL Final  . RBC 09/28/2017 4.90  3.87 - 5.11 Mil/uL Final  . Hemoglobin 09/28/2017 14.8  12.0 - 15.0 g/dL Final  . HCT 09/28/2017 44.2  36.0 - 46.0 % Final  . MCV 09/28/2017 90.3  78.0 - 100.0 fl Final  . MCHC 09/28/2017 33.6  30.0 - 36.0 g/dL Final  . RDW 09/28/2017 12.4  11.5 - 15.5 % Final  . Platelets 09/28/2017 293.0  150.0 - 400.0 K/uL Final  . Neutrophils Relative % 09/28/2017 50.3  43.0 - 77.0 % Final  . Lymphocytes Relative 09/28/2017 34.3  12.0 - 46.0 % Final  . Monocytes Relative 09/28/2017 11.6  3.0 - 12.0 % Final  . Eosinophils Relative 09/28/2017 2.9  0.0 - 5.0 % Final  . Basophils Relative 09/28/2017 0.9  0.0 - 3.0 % Final  . Neutro Abs 09/28/2017 2.3  1.4 - 7.7 K/uL Final  . Lymphs Abs 09/28/2017 1.6  0.7 - 4.0 K/uL Final  .  Monocytes Absolute 09/28/2017 0.5  0.1 - 1.0 K/uL Final  . Eosinophils Absolute 09/28/2017 0.1  0.0 - 0.7 K/uL Final  . Basophils Absolute 09/28/2017 0.0  0.0 - 0.1 K/uL Final  . Sodium 09/28/2017 139  135 - 145 mEq/L Final  . Potassium 09/28/2017 4.3  3.5 - 5.1 mEq/L Final  . Chloride 09/28/2017 105  96 - 112 mEq/L Final  . CO2 09/28/2017 29  19 - 32 mEq/L Final  . Glucose, Bld 09/28/2017 90  70 - 99 mg/dL Final  . BUN 09/28/2017 18  6 - 23 mg/dL Final  . Creatinine, Ser 09/28/2017 0.82  0.40 - 1.20 mg/dL Final  . Total Bilirubin 09/28/2017 0.6  0.2 - 1.2 mg/dL Final  . Alkaline Phosphatase 09/28/2017 64  39 - 117 U/L Final  . AST 09/28/2017 14  0 - 37 U/L Final  . ALT 09/28/2017 14  0 - 35 U/L Final  . Total Protein 09/28/2017 7.2  6.0 - 8.3 g/dL Final  . Albumin 09/28/2017 4.2  3.5 - 5.2 g/dL Final  . Calcium 09/28/2017 9.4  8.4 - 10.5 mg/dL Final  . GFR 09/28/2017 74.67  >60.00 mL/min Final  . Cholesterol 09/28/2017 198  0 - 200 mg/dL Final   ATP III Classification       Desirable:  < 200 mg/dL               Borderline High:  200 - 239 mg/dL          High:  > = 240 mg/dL  .  Triglycerides 09/28/2017 89.0  0.0 - 149.0 mg/dL Final   Normal:  <150 mg/dLBorderline High:  150 - 199 mg/dL  . HDL 09/28/2017 57.60  >39.00 mg/dL Final  . VLDL 09/28/2017 17.8  0.0 - 40.0 mg/dL Final  . LDL Cholesterol 09/28/2017 122* 0 - 99 mg/dL Final  . Total CHOL/HDL Ratio 09/28/2017 3   Final                  Men          Women1/2 Average Risk     3.4          3.3Average Risk          5.0          4.42X Average Risk          9.6          7.13X Average Risk          15.0          11.0                      . NonHDL 09/28/2017 140.15   Final   NOTE:  Non-HDL goal should be 30 mg/dL higher than patient's LDL goal (i.e. LDL goal of < 70 mg/dL, would have non-HDL goal of < 100 mg/dL)  . TSH 09/28/2017 2.63  0.35 - 4.50 uIU/mL Final  . VITD 09/28/2017 35.51  30.00 - 100.00 ng/mL Final  . 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

## 2018-05-30 ENCOUNTER — Encounter: Payer: Self-pay | Admitting: Internal Medicine

## 2018-05-30 ENCOUNTER — Ambulatory Visit: Payer: 59 | Admitting: Internal Medicine

## 2018-05-30 VITALS — BP 126/70 | HR 76 | Ht 67.0 in | Wt 161.8 lb

## 2018-05-30 DIAGNOSIS — M89319 Hypertrophy of bone, unspecified shoulder: Secondary | ICD-10-CM | POA: Insufficient documentation

## 2018-05-30 DIAGNOSIS — E559 Vitamin D deficiency, unspecified: Secondary | ICD-10-CM

## 2018-05-30 DIAGNOSIS — E039 Hypothyroidism, unspecified: Secondary | ICD-10-CM | POA: Diagnosis not present

## 2018-05-30 MED ORDER — ARMOUR THYROID 60 MG PO TABS: ORAL_TABLET | ORAL | 3 refills | Status: DC

## 2018-05-30 NOTE — Patient Instructions (Signed)
Please continue Armour 60 mg daily.  Take the thyroid hormone every day, with water, at least 30 minutes before breakfast, separated by at least 4 hours from: - acid reflux medications - calcium - iron - multivitamins  Please return in 1 year.

## 2018-05-30 NOTE — Progress Notes (Addendum)
Patient ID: Quinnley Colasurdo, female   DOB: August 07, 1956, 62 y.o.   MRN: [id number]   HPI  Selinda Korzeniewski is a 62 y.o.-year-old female, returning for f/u for hypothyroidism and vitamin D insufficiency. Last visit  1 year ago.  She contacted me at the end of last month for a mass in her lower neck.  She thought this was related to the thyroid and requested to be seen immediately.  Since I was out of the office, she was seen by my colleague, Dr. Dwyane Dee, who noticed that it the mass was actually related to the head of the right clavicle.  She had an x-ray of her clavicle, which showed mild degenerative change and spurring of the [place] joint on the right corresponding to the palpable abnormality.  She is relieved by this and mentions that she has no pain at the site of the swelling.  Reviewed and addended history: Pt. has been dx with hypothyroidism in 2012 (foggy mind, weight gain, fatigue); is on Armour 60 mg (equivalent of Levothyroxine 100 mcg), but was off 2016 Summer >> TSH in the Fall of 2016: 3.33 >> repeat TSH 1.83.  She was then restarted on Armour with maintenance of normal TFTs (?).  Pt is on Armour 60 mg daily, taken: - in am - fasting - at least 30 min from b'fast - no Ca, Fe, MVI, PPIs - not on Biotin  Reviewed patient's TFTs: Lab Results  Component Value Date   TSH 1.58 05/10/2018   TSH 2.63 09/28/2017   TSH 0.97 02/21/2017   TSH 1.31 05/25/2016   TSH 1.31 11/26/2015   TSH 1.83 09/29/2015   TSH 3.33 07/03/2015   TSH 2.9 06/28/2014   FREET4 0.75 05/10/2018   FREET4 0.74 02/21/2017   FREET4 0.83 05/25/2016   FREET4 0.82 11/26/2015    Lab Results  Component Value Date   T3FREE 3.8 05/10/2018   T3FREE 4.2 02/21/2017   T3FREE 3.8 05/25/2016   T3FREE 3.4 11/26/2015   T3FREE 2.7 06/28/2014   Pt denies: - feeling nodules in neck - hoarseness - dysphagia - choking - SOB with lying down  She has no FH of thyroid disorders. No FH of thyroid cancer. No h/o radiation tx to head or  neck.  No Biotin use. No recent steroids use.   She has a history of vitamin D def:  Reviewed previous vitamin D levels: Lab Results  Component Value Date   VD25OH 35.51 09/28/2017   VD25OH 51.77 02/21/2017   VD25OH 24.65 (L) 05/25/2016   VD25OH 29.33 (L) 11/26/2015   She is on 5000 units vitamin D daily.  She stopped HRT 02/2015.  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 She refused to restart a statin. She walks and plays tennis. Eats little meat, but does eat icecream, cheese.  In October, she goes to [place] with her sister, as part of an organized tour.  ROS: Constitutional: no weight gain/no weight loss, no fatigue, no subjective hyperthermia, no subjective hypothermia Eyes: no blurry vision, no xerophthalmia ENT: no sore throat, + see HPI Cardiovascular: no CP/no SOB/no palpitations/no leg swelling Respiratory: no cough/no SOB/no wheezing Gastrointestinal: no N/no V/no D/no C/no acid reflux Musculoskeletal: no muscle aches/no joint aches Skin: no rashes, no hair loss Neurological: no tremors/no numbness/no tingling/no dizziness  I reviewed pt's medications, allergies, PMH, social hx, family hx, and changes were documented in the history of present illness. Otherwise, unchanged from my initial visit note.  Past Medical History:  Diagnosis Date  .  Hx of melanoma of skin   . Hyperlipidemia   . Hypothyroidism    Dr. Jaynie Collins managing. armour thyroid 60mg    . Melanoma ([place])    R shin 2009. sees derm q6 months  . Varicose vein    Past Surgical History:  Procedure Laterality Date  . BREAST BIOPSY     benign 2000-stereotactic biopsy  . BREAST BIOPSY Right 03/12/2004  . VARICOSE VEIN SURGERY     2001   Social History Main Topics  . Smoking status: Never Smoker   . Smokeless tobacco: Not on file  . Alcohol Use:     3 Standard drinks or equivalent per week  . Drug Use: No   Social History Narrative   Married (marriage is a stressor). 1 daughter Cloyde Reams at [employer] age 15 in 2016.       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       Hobbies: tennis, walking, time with dogs, pickleball. Reading. Outdoors.    Current Outpatient Medications on File Prior to Visit  Medication Sig Dispense Refill  . ARMOUR THYROID 60 MG tablet TAKE 1 TABLET(60 MG) BY MOUTH DAILY BEFORE BREAKFAST 90 tablet 3  . Bioflavonoid Products (ESTER-C) 657-846-96 MG TABS Take by mouth.    . Cholecalciferol (VITAMIN D) 2000 units tablet Take 5,000 Units by mouth daily.     No current facility-administered medications on file prior to visit.    No Known Allergies Family History  Problem Relation Age of Onset  . Heart attack Father        bypass 93, 36 fatal MI, former smoker  . Panic disorder Mother   . COPD Mother        smoked until 18  . Congestive Heart Failure Mother        pacemaker  . Hypertension Mother   . Hyperlipidemia Sister    PE: BP 126/70   Pulse 76   Ht 5\' 7"  (1.702 m)   Wt 161 lb 12.8 oz (73.4 kg)   SpO2 96%   BMI 25.34 kg/m  Wt Readings from Last 3 Encounters:  05/30/18 161 lb 12.8 oz (73.4 kg)  05/10/18 159 lb 9.6 oz (72.4 kg)  11/17/17 161 lb 2 oz (73.1 kg)   Constitutional: Normal weight, in NAD Eyes: PERRLA, EOMI, no exophthalmos ENT: moist mucous membranes, no thyromegaly, no cervical lymphadenopathy Cardiovascular: RRR, No MRG Respiratory: CTA B Gastrointestinal: abdomen soft, NT, ND, BS+ Musculoskeletal: no deformities, strength intact in all 4 Skin: moist, warm, no rashes Neurological: no tremor with outstretched hands, DTR normal in all 4  ASSESSMENT: 1. Hypothyroidism  2. Vitamin D insufficiency  3.  Head of clavicle enlargement  PLAN:  1. Patient with history of mild hypothyroidism, on desiccated thyroid extract.  She has an unusual history of being off thyroid hormones for the whole summer 2016, however, TSH checked 06/2015 returned normal, 3.33.  She was then started on a full  dose of Armour, 60 mg daily (equivalent of 100 mcg of levothyroxine) and her TSH remained normal - latest thyroid labs reviewed with pt >> normal 1 month ago - she continues on Armour 60 Mg daily - pt feels good on this dose. - we discussed about taking the thyroid hormone every day, with water, >30 minutes before breakfast, separated by >4 hours from acid reflux medications, calcium, iron, multivitamins. Pt. is taking it correctly. - We reviewed together her latest TFTs and they were normal on  the current dose of Armour - I refilled her Armour dose for a year - RTC in 1 year  2. Vit D insufficiency -On vitamin D3 5000 units daily -Latest level of vitamin D was normal 09/2017 -We will check this at next visit  3.  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  She is relieved by this and mentions no pain at the site  Philemon Kingdom, MD PhD [facility]

## 2018-07-16 ENCOUNTER — Other Ambulatory Visit: Payer: Self-pay | Admitting: Internal Medicine

## 2018-09-18 DIAGNOSIS — I83812 Varicose veins of left lower extremities with pain: Secondary | ICD-10-CM | POA: Diagnosis not present

## 2018-10-01 ENCOUNTER — Encounter: Payer: Self-pay | Admitting: Gastroenterology

## 2018-11-16 ENCOUNTER — Other Ambulatory Visit: Payer: Self-pay | Admitting: Internal Medicine

## 2019-01-24 ENCOUNTER — Encounter: Payer: Self-pay | Admitting: Family Medicine

## 2019-01-24 ENCOUNTER — Ambulatory Visit (INDEPENDENT_AMBULATORY_CARE_PROVIDER_SITE_OTHER): Payer: Medicare Other | Admitting: Family Medicine

## 2019-01-24 ENCOUNTER — Other Ambulatory Visit: Payer: Self-pay

## 2019-01-24 VITALS — BP 129/77 | Temp 96.5°F | Ht 67.0 in | Wt 155.0 lb

## 2019-01-24 DIAGNOSIS — S30861A Insect bite (nonvenomous) of abdominal wall, initial encounter: Secondary | ICD-10-CM

## 2019-01-24 DIAGNOSIS — L089 Local infection of the skin and subcutaneous tissue, unspecified: Secondary | ICD-10-CM | POA: Diagnosis not present

## 2019-01-24 DIAGNOSIS — W57XXXA Bitten or stung by nonvenomous insect and other nonvenomous arthropods, initial encounter: Secondary | ICD-10-CM

## 2019-01-24 MED ORDER — DOXYCYCLINE HYCLATE 100 MG PO TABS: 100.0000 mg | ORAL_TABLET | Freq: Two times a day (BID) | ORAL | 0 refills | Status: DC

## 2019-01-24 NOTE — Progress Notes (Signed)
   Virtual Visit via Video   I connected with Julie Pugh on 01/24/19 at  9:20 AM EDT by a video enabled telemedicine application and verified that I am speaking with the correct person using two identifiers. Location patient: Home Location provider: Acup[facility]urist, Office Persons participating in the virtual visit: pt and myself  I discussed the limitations of evaluation and management by telemedicine and the availability of in person appointments. The patient expressed understanding and agreed to proceed.  Subjective:   HPI:  Tick Bite- occurred on R side on Sunday but was not discovered until early Monday AM.  Not engorged at time of removal.  Sore to touch.  Tick was fully removed.  Has redness around the central bite site but this is not spreading.  No fevers.  No rash present.  No HA.  Pt has taken Doxy in the past and would prefer this today over Keflex  ROS: See pertinent positives and negatives per HPI.  Patient Active Problem List   Diagnosis Date Noted  . 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 Visit for preventive health examination 09/28/2017  . Vitamin D deficiency 05/26/2017  . Chest tightness 02/08/2017  . Tick bite 04/02/2016  . Postmenopausal status 11/26/2015  . Hyperlipidemia 07/10/2015  . Abnormal Pap smear of cervix 01/12/2015  . Hormone replacement therapy 01/07/2015  . Depression 01/07/2015  . Hypothyroidism   . Melanoma ([place])   . Varicose vein     Social History   Tobacco Use  . Smoking status: Never Smoker  . Smokeless tobacco: Never Used  Substance Use Topics  . Alcohol use: Yes    Alcohol/week: 4.0 standard drinks    Types: 4 Standard drinks or equivalent per week    Current Outpatient Medications:  .  ARMOUR THYROID 60 MG tablet, TAKE 1 TABLET(60 MG) BY MOUTH DAILY BEFORE BREAKFAST, Disp: 90 tablet, Rfl: 3 .  Bioflavonoid Products (ESTER-C) 500-200-60 MG TABS, Take by mouth., Disp: , Rfl:  .  Cholecalciferol (VITAMIN D) 2000 units tablet,  Take 5,000 Units by mouth daily., Disp: , Rfl:  .  ARMOUR THYROID 60 MG tablet, TAKE 1 TABLET BY MOUTH EVERY DAY BEFORE BREAKFAST, Disp: 90 tablet, Rfl: 0  No Known Allergies  Objective:   BP 129/77   Temp (!) 96.5 F (35.8 C)   Ht 5\' 7"  (1.702 m)   Wt 155 lb (70.3 kg)   BMI 24.28 kg/m  AAOx3, NAD NCAT, EOMI No obvious CN deficits Coloring WNL Central scab surrounded by erythema on R flank w/o bullseye lesions or rash Pt is able to speak clearly, coherently without shortness of breath or increased work of breathing.  Thought process is linear.  Mood is appropriate.   Assessment and Plan:   Infected tick bite- new.  No evidence or sxs of RMSF or Lyme.  Was initially going to treat w/ Keflex for cellulitis but pt prefers Doxy b/c 'that's the treatment of choice for tick bites'.  Will send Doxy at pt's request.  Reviewed supportive care and red flags that should prompt return.  Pt expressed understanding and is in agreement w/ plan.    Annye Asa, MD 01/24/2019

## 2019-01-24 NOTE — Progress Notes (Signed)
I have discussed the procedure for the virtual visit with the patient who has given consent to proceed with assessment and treatment.   Tyse Auriemma, CMA     

## 2019-03-13 DIAGNOSIS — M9903 Segmental and somatic dysfunction of lumbar region: Secondary | ICD-10-CM | POA: Diagnosis not present

## 2019-03-13 DIAGNOSIS — M9902 Segmental and somatic dysfunction of thoracic region: Secondary | ICD-10-CM | POA: Diagnosis not present

## 2019-03-13 DIAGNOSIS — M5136 Other intervertebral disc degeneration, lumbar region: Secondary | ICD-10-CM | POA: Diagnosis not present

## 2019-03-13 DIAGNOSIS — M47896 Other spondylosis, lumbar region: Secondary | ICD-10-CM | POA: Diagnosis not present

## 2019-03-22 ENCOUNTER — Other Ambulatory Visit: Payer: Self-pay | Admitting: Internal Medicine

## 2019-03-22 DIAGNOSIS — M9902 Segmental and somatic dysfunction of thoracic region: Secondary | ICD-10-CM | POA: Diagnosis not present

## 2019-03-22 DIAGNOSIS — M5136 Other intervertebral disc degeneration, lumbar region: Secondary | ICD-10-CM | POA: Diagnosis not present

## 2019-03-22 DIAGNOSIS — M47896 Other spondylosis, lumbar region: Secondary | ICD-10-CM | POA: Diagnosis not present

## 2019-03-22 DIAGNOSIS — M9903 Segmental and somatic dysfunction of lumbar region: Secondary | ICD-10-CM | POA: Diagnosis not present

## 2019-04-09 DIAGNOSIS — Z03818 Encounter for observation for suspected exposure to other biological agents ruled out: Secondary | ICD-10-CM | POA: Diagnosis not present

## 2019-05-01 DIAGNOSIS — M5136 Other intervertebral disc degeneration, lumbar region: Secondary | ICD-10-CM | POA: Diagnosis not present

## 2019-05-01 DIAGNOSIS — M9903 Segmental and somatic dysfunction of lumbar region: Secondary | ICD-10-CM | POA: Diagnosis not present

## 2019-05-01 DIAGNOSIS — M9902 Segmental and somatic dysfunction of thoracic region: Secondary | ICD-10-CM | POA: Diagnosis not present

## 2019-05-01 DIAGNOSIS — M47896 Other spondylosis, lumbar region: Secondary | ICD-10-CM | POA: Diagnosis not present

## 2019-05-29 DIAGNOSIS — D1801 Hemangioma of skin and subcutaneous tissue: Secondary | ICD-10-CM | POA: Diagnosis not present

## 2019-05-29 DIAGNOSIS — D225 Melanocytic nevi of trunk: Secondary | ICD-10-CM | POA: Diagnosis not present

## 2019-05-29 DIAGNOSIS — L718 Other rosacea: Secondary | ICD-10-CM | POA: Diagnosis not present

## 2019-05-29 DIAGNOSIS — L821 Other seborrheic keratosis: Secondary | ICD-10-CM | POA: Diagnosis not present

## 2019-05-29 DIAGNOSIS — L570 Actinic keratosis: Secondary | ICD-10-CM | POA: Diagnosis not present

## 2019-05-29 DIAGNOSIS — D2262 Melanocytic nevi of left upper limb, including shoulder: Secondary | ICD-10-CM | POA: Diagnosis not present

## 2019-05-29 DIAGNOSIS — D2272 Melanocytic nevi of left lower limb, including hip: Secondary | ICD-10-CM | POA: Diagnosis not present

## 2019-05-31 ENCOUNTER — Other Ambulatory Visit: Payer: Self-pay

## 2019-05-31 ENCOUNTER — Encounter: Payer: Self-pay | Admitting: Family Medicine

## 2019-06-01 NOTE — Telephone Encounter (Signed)
Ok to place referral and send to requested provider.

## 2019-06-04 ENCOUNTER — Encounter: Payer: Self-pay | Admitting: Internal Medicine

## 2019-06-04 ENCOUNTER — Other Ambulatory Visit: Payer: Self-pay

## 2019-06-04 ENCOUNTER — Ambulatory Visit (INDEPENDENT_AMBULATORY_CARE_PROVIDER_SITE_OTHER): Payer: Medicare Other | Admitting: Internal Medicine

## 2019-06-04 VITALS — BP 140/78 | HR 70 | Ht 67.0 in | Wt 154.0 lb

## 2019-06-04 DIAGNOSIS — E039 Hypothyroidism, unspecified: Secondary | ICD-10-CM | POA: Diagnosis not present

## 2019-06-04 DIAGNOSIS — Z9889 Other specified postprocedural states: Secondary | ICD-10-CM

## 2019-06-04 DIAGNOSIS — E559 Vitamin D deficiency, unspecified: Secondary | ICD-10-CM

## 2019-06-04 NOTE — Patient Instructions (Addendum)
Please continue Armour 60 mg daily  Take the thyroid hormone every day, with water, at least 30 minutes before breakfast, separated by at least 4 hours from: - acid reflux medications - calcium - iron - multivitamins  Please stop at the lab.  Please return in 1 year. 

## 2019-06-04 NOTE — Progress Notes (Signed)
Patient ID: Julie Pugh, female   DOB: [birth year], 63 y.o.   MRN: [id number]   HPI  Julie Pugh is a 63 y.o.-year-old female, returning for f/u for hypothyroidism and vitamin D insufficiency. Last visit 1 year ago.  She had 2 tick bites in last 4 mo. Treated with Doxycycline.  Reviewed history: Pt. has been dx with hypothyroidism in 2012 (foggy mind, weight gain, fatigue); is on Armour 60 mg (equivalent of Levothyroxine 100 mcg), but was off 2016 Summer >> TSH in the Fall of 2016: 3.33 >> repeat TSH 1.83.  She was then restarted on Armour with maintenance of normal TFTs (?).  Pt is on Armour 60 mg daily, taken: - in am (5-6 am) - fasting - at least 30 min from b'fast - no Ca, Fe - started MVI  - pm  - no PPIs - not on Biotin  Reviewed patient's TFTs: Lab Results  Component Value Date   TSH 1.58 05/10/2018   TSH 2.63 09/28/2017   TSH 0.97 02/21/2017   TSH 1.31 05/25/2016   TSH 1.31 11/26/2015   TSH 1.83 09/29/2015   TSH 3.33 07/03/2015   TSH 2.9 06/28/2014   FREET4 0.75 05/10/2018   FREET4 0.74 02/21/2017   FREET4 0.83 05/25/2016   FREET4 0.82 11/26/2015    Lab Results  Component Value Date   T3FREE 3.8 05/10/2018   T3FREE 4.2 02/21/2017   T3FREE 3.8 05/25/2016   T3FREE 3.4 11/26/2015   T3FREE 2.7 06/28/2014   Pt denies: - feeling nodules in neck - hoarseness - dysphagia - choking - SOB with lying down She has no FH of thyroid disorders. No FH of thyroid cancer. No h/o radiation tx to head or neck.  No herbal supplements. No Biotin use. No recent steroids use.   She has a history of vitamin D def:  Reviewed her previous vitamin D levels: Lab Results  Component Value Date   VD25OH 35.51 09/28/2017   VD25OH 51.77 02/21/2017   VD25OH 24.65 (L) 05/25/2016   VD25OH 29.33 (L) 11/26/2015   She continues on 5000 units vitamin D daily - but actually getting 2000-4000 units daily.  She stopped HRT 02/2015.  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  She refused to restart a statin. She walks and plays tennis. Eats little meat, but does eat icecream, cheese.  In October of last year she went to [place] with her sister, as part of an organized tour.  ROS: Constitutional: no weight gain/no weight loss, no fatigue, no subjective hyperthermia, no subjective hypothermia Eyes: no blurry vision, no xerophthalmia ENT: no sore throat, + see HPI Cardiovascular: no CP/no SOB/no palpitations/no leg swelling, + dilated superficial veins L leg (chronic) Respiratory: no cough/no SOB/no wheezing Gastrointestinal: no N/no V/no D/no C/no acid reflux Musculoskeletal: + muscle aches/no joint aches Skin: no rashes, no hair loss Neurological: no tremors/no numbness/no tingling/no dizziness  I reviewed pt's medications, allergies, PMH, social hx, family hx, and changes were documented in the history of present illness. Otherwise, unchanged from my initial visit note.  Past Medical History:  Diagnosis Date  . Hx of melanoma of skin   . Hyperlipidemia   . Hypothyroidism    Dr. Jaynie Collins managing. armour thyroid 60mg    . Melanoma ([place])    R shin 2009. sees derm q6 months  . Varicose vein    Past Surgical History:  Procedure Laterality Date  . BREAST BIOPSY     benign 2000-stereotactic biopsy  . BREAST BIOPSY Right  03/12/2004  . VARICOSE VEIN SURGERY     2001   Social History Main Topics  . Smoking status: Never Smoker   . Smokeless tobacco: Not on file  . Alcohol Use:     3 Standard drinks or equivalent per week  . Drug Use: No   Social History Narrative   Married (marriage is a stressor). 1 daughter Cloyde Reams at [employer] age 12 in 2016.       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       Hobbies: tennis, walking, time with dogs, pickleball. Reading. Outdoors.    Current Outpatient Medications on File Prior to Visit  Medication Sig Dispense Refill  . ARMOUR THYROID 60 MG tablet TAKE 1 TABLET  BY MOUTH EVERY DAY BEFORE BREAKFAST 90 tablet 0  . Bioflavonoid Products (ESTER-C) [id number] MG TABS Take by mouth.    . Cholecalciferol (VITAMIN D) 2000 units tablet Take 5,000 Units by mouth daily.     No current facility-administered medications on file prior to visit.    No Known Allergies Family History  Problem Relation Age of Onset  . Heart attack Father        bypass 25, 18 fatal MI, former smoker  . Panic disorder Mother   . COPD Mother        smoked until 86  . Congestive Heart Failure Mother        pacemaker  . Hypertension Mother   . Hyperlipidemia Sister    PE: BP 140/78   Pulse 70   Ht 5\' 7"  (1.702 m)   Wt 154 lb (69.9 kg)   SpO2 97%   BMI 24.12 kg/m  Wt Readings from Last 3 Encounters:  06/04/19 154 lb (69.9 kg)  01/24/19 155 lb (70.3 kg)  05/30/18 161 lb 12.8 oz (73.4 kg)   Constitutional: normal weight, in NAD Eyes: PERRLA, EOMI, no exophthalmos ENT: moist mucous membranes, no thyromegaly, no cervical lymphadenopathy Cardiovascular: RRR, No MRG Respiratory: CTA B Gastrointestinal: abdomen soft, NT, ND, BS+ Musculoskeletal: no deformities, strength intact in all 4 Skin: moist, warm, no rashes Neurological: no tremor with outstretched hands, DTR normal in all 4  ASSESSMENT: 1. Hypothyroidism  2. Vitamin D insufficiency  PLAN:  1. Patient with history of mild hypothyroidism, on desiccated thyroid extract.  She has an unusual history of being off thyroid hormones for the whole summer 2016, however TSH checked 06/2015 returned normal at 3.33.  She was then started on a full dose of Armour 60 mg daily (equivalent of 100 mcg of levothyroxine) her TSH returned normal afterwards.  She continues on the same dose of Armour. - latest thyroid labs reviewed with pt >> normal in 04/2018 - pt feels good on this dose. - we discussed about taking the thyroid hormone every day, with water, >30 minutes before breakfast, separated by >4 hours from acid reflux  medications, calcium, iron, multivitamins. Pt. is taking it correctly. - will check thyroid tests today: TSH, free T3 and fT4 - If labs are abnormal, she will need to return for repeat TFTs in 1.5 months  2. Vit D insufficiency -Continues on vitamin D but not getting 5000 units daily, but approximately 2000 to 4000 units daily.  -Latest vitamin D levels normal 09/2017.  We could not recheck this at last visit since this was a virtual visit. -We will recheck it now  Office Visit on 06/04/2019  Component Date Value Ref Range Status  . TSH  06/04/2019 1.46  0.35 - 4.50 uIU/mL Final  . Free T4 06/04/2019 0.91  0.60 - 1.60 ng/dL Final   Comment: Specimens from patients who are undergoing biotin therapy and /or ingesting biotin supplements may contain high levels of biotin.  The higher biotin concentration in these specimens interferes with this Free T4 assay.  Specimens that contain high levels  of biotin may cause false high results for this Free T4 assay.  Please interpret results in light of the total clinical presentation of the patient.    . T3, Free 06/04/2019 3.4  2.3 - 4.2 pg/mL Final  . VITD 06/04/2019 27.37* 30.00 - 100.00 ng/mL Final   Her vitamin D level is worse.  I will advise her to try to increase to 4000 units daily consistently or switch to 5000 units daily. We will recheck her vitamin D level in 3 months.  Philemon Kingdom, MD PhD [facility]

## 2019-06-05 ENCOUNTER — Encounter: Payer: Self-pay | Admitting: Internal Medicine

## 2019-06-05 LAB — VITAMIN D 25 HYDROXY (VIT D DEFICIENCY, FRACTURES): VITD: 27.37 ng/mL — ABNORMAL LOW (ref 30.00–100.00)

## 2019-06-05 LAB — T3, FREE: T3, Free: 3.4 pg/mL (ref 2.3–4.2)

## 2019-06-05 LAB — TSH: TSH: 1.46 u[IU]/mL (ref 0.35–4.50)

## 2019-06-05 LAB — T4, FREE: Free T4: 0.91 ng/dL (ref 0.60–1.60)

## 2019-06-06 DIAGNOSIS — M9903 Segmental and somatic dysfunction of lumbar region: Secondary | ICD-10-CM | POA: Diagnosis not present

## 2019-06-06 DIAGNOSIS — M5136 Other intervertebral disc degeneration, lumbar region: Secondary | ICD-10-CM | POA: Diagnosis not present

## 2019-06-06 DIAGNOSIS — M9902 Segmental and somatic dysfunction of thoracic region: Secondary | ICD-10-CM | POA: Diagnosis not present

## 2019-06-06 DIAGNOSIS — M47896 Other spondylosis, lumbar region: Secondary | ICD-10-CM | POA: Diagnosis not present

## 2019-07-17 DIAGNOSIS — M9902 Segmental and somatic dysfunction of thoracic region: Secondary | ICD-10-CM | POA: Diagnosis not present

## 2019-07-17 DIAGNOSIS — M9903 Segmental and somatic dysfunction of lumbar region: Secondary | ICD-10-CM | POA: Diagnosis not present

## 2019-07-17 DIAGNOSIS — M5136 Other intervertebral disc degeneration, lumbar region: Secondary | ICD-10-CM | POA: Diagnosis not present

## 2019-07-17 DIAGNOSIS — M47896 Other spondylosis, lumbar region: Secondary | ICD-10-CM | POA: Diagnosis not present

## 2019-07-19 DIAGNOSIS — K648 Other hemorrhoids: Secondary | ICD-10-CM | POA: Diagnosis not present

## 2019-07-19 DIAGNOSIS — Z1211 Encounter for screening for malignant neoplasm of colon: Secondary | ICD-10-CM | POA: Diagnosis not present

## 2019-07-19 LAB — HM COLONOSCOPY

## 2019-07-30 ENCOUNTER — Other Ambulatory Visit: Payer: Self-pay

## 2019-07-30 MED ORDER — ARMOUR THYROID 60 MG PO TABS: 60.0000 mg | ORAL_TABLET | Freq: Every day | ORAL | 1 refills | Status: DC

## 2019-08-20 ENCOUNTER — Other Ambulatory Visit: Payer: Self-pay | Admitting: Internal Medicine

## 2019-08-20 ENCOUNTER — Encounter: Payer: Self-pay | Admitting: Internal Medicine

## 2019-08-20 DIAGNOSIS — E039 Hypothyroidism, unspecified: Secondary | ICD-10-CM

## 2019-08-20 MED ORDER — LEVOTHYROXINE SODIUM 100 MCG PO TABS: 100.0000 ug | ORAL_TABLET | Freq: Every day | ORAL | 3 refills | Status: DC

## 2019-08-21 ENCOUNTER — Encounter: Payer: Self-pay | Admitting: General Practice

## 2019-08-30 ENCOUNTER — Encounter: Payer: Self-pay | Admitting: Family Medicine

## 2019-08-30 ENCOUNTER — Ambulatory Visit (INDEPENDENT_AMBULATORY_CARE_PROVIDER_SITE_OTHER): Payer: Medicare Other | Admitting: Family Medicine

## 2019-08-30 ENCOUNTER — Other Ambulatory Visit: Payer: Self-pay | Admitting: Family Medicine

## 2019-08-30 ENCOUNTER — Other Ambulatory Visit: Payer: Self-pay

## 2019-08-30 VITALS — BP 122/74 | HR 72 | Temp 97.9°F | Resp 16 | Ht 67.0 in | Wt 154.5 lb

## 2019-08-30 DIAGNOSIS — E785 Hyperlipidemia, unspecified: Secondary | ICD-10-CM | POA: Diagnosis not present

## 2019-08-30 DIAGNOSIS — Z0000 Encounter for general adult medical examination without abnormal findings: Secondary | ICD-10-CM | POA: Diagnosis not present

## 2019-08-30 DIAGNOSIS — Z1231 Encounter for screening mammogram for malignant neoplasm of breast: Secondary | ICD-10-CM

## 2019-08-30 DIAGNOSIS — E039 Hypothyroidism, unspecified: Secondary | ICD-10-CM

## 2019-08-30 DIAGNOSIS — Z780 Asymptomatic menopausal state: Secondary | ICD-10-CM

## 2019-08-30 DIAGNOSIS — E559 Vitamin D deficiency, unspecified: Secondary | ICD-10-CM | POA: Diagnosis not present

## 2019-08-30 NOTE — Assessment & Plan Note (Signed)
Chronic problem.  Has been trying to control w/ diet and exercise.  Check labs and determine if medication needed.

## 2019-08-30 NOTE — Assessment & Plan Note (Signed)
Chronic problem.  Following w/ Dr Cruzita Lederer.  Check labs.

## 2019-08-30 NOTE — Assessment & Plan Note (Signed)
Pt has hx of this.  Check labs and replete prn. 

## 2019-08-30 NOTE — Assessment & Plan Note (Signed)
Pt's PE WNL.  UTD on colonoscopy, mammo and DEXA are scheduled.  Pt declines all immunizations.  Check labs.  Anticipatory guidance provided.

## 2019-08-30 NOTE — Patient Instructions (Signed)
Follow up in 1 year or as needed We'll notify you of your lab results and make any changes if needed Continue to work on healthy diet and regular exercise- you look great! Call with any questions or concerns Stay Safe!  Stay Healthy!   Preventive Care 109-65 Years Old, Female Preventive care refers to visits with your health care provider and lifestyle choices that can promote health and wellness. This includes:  A yearly physical exam. This may also be called an annual well check.  Regular dental visits and eye exams.  Immunizations.  Screening for certain conditions.  Healthy lifestyle choices, such as eating a healthy diet, getting regular exercise, not using drugs or products that contain nicotine and tobacco, and limiting alcohol use. What can I expect for my preventive care visit? Physical exam Your health care provider will check your:  Height and weight. This may be used to calculate body mass index (BMI), which tells if you are at a healthy weight.  Heart rate and blood pressure.  Skin for abnormal spots. Counseling Your health care provider may ask you questions about your:  Alcohol, tobacco, and drug use.  Emotional well-being.  Home and relationship well-being.  Sexual activity.  Eating habits.  Work and work Statistician.  Method of birth control.  Menstrual cycle.  Pregnancy history. What immunizations do I need?  Influenza (flu) vaccine  This is recommended every year. Tetanus, diphtheria, and pertussis (Tdap) vaccine  You may need a Td booster every 10 years. Varicella (chickenpox) vaccine  You may need this if you have not been vaccinated. Zoster (shingles) vaccine  You may need this after age 35. Measles, mumps, and rubella (MMR) vaccine  You may need at least one dose of MMR if you were born in 1957 or later. You may also need a second dose. Pneumococcal conjugate (PCV13) vaccine  You may need this if you have certain conditions and  were not previously vaccinated. Pneumococcal polysaccharide (PPSV23) vaccine  You may need one or two doses if you smoke cigarettes or if you have certain conditions. Meningococcal conjugate (MenACWY) vaccine  You may need this if you have certain conditions. Hepatitis A vaccine  You may need this if you have certain conditions or if you travel or work in places where you may be exposed to hepatitis A. Hepatitis B vaccine  You may need this if you have certain conditions or if you travel or work in places where you may be exposed to hepatitis B. Haemophilus influenzae type b (Hib) vaccine  You may need this if you have certain conditions. Human papillomavirus (HPV) vaccine  If recommended by your health care provider, you may need three doses over 6 months. You may receive vaccines as individual doses or as more than one vaccine together in one shot (combination vaccines). Talk with your health care provider about the risks and benefits of combination vaccines. What tests do I need? Blood tests  Lipid and cholesterol levels. These may be checked every 5 years, or more frequently if you are over 30 years old.  Hepatitis C test.  Hepatitis B test. Screening  Lung cancer screening. You may have this screening every year starting at age 59 if you have a 30-pack-year history of smoking and currently smoke or have quit within the past 15 years.  Colorectal cancer screening. All adults should have this screening starting at age 38 and continuing until age 56. Your health care provider may recommend screening at age 16 if you  are at increased risk. You will have tests every 1-10 years, depending on your results and the type of screening test.  Diabetes screening. This is done by checking your blood sugar (glucose) after you have not eaten for a while (fasting). You may have this done every 1-3 years.  Mammogram. This may be done every 1-2 years. Talk with your health care provider about  when you should start having regular mammograms. This may depend on whether you have a family history of breast cancer.  BRCA-related cancer screening. This may be done if you have a family history of breast, ovarian, tubal, or peritoneal cancers.  Pelvic exam and Pap test. This may be done every 3 years starting at age 71. Starting at age 47, this may be done every 5 years if you have a Pap test in combination with an HPV test. Other tests  Sexually transmitted disease (STD) testing.  Bone density scan. This is done to screen for osteoporosis. You may have this scan if you are at high risk for osteoporosis. Follow these instructions at home: Eating and drinking  Eat a diet that includes fresh fruits and vegetables, whole grains, lean protein, and low-fat dairy.  Take vitamin and mineral supplements as recommended by your health care provider.  Do not drink alcohol if: ? Your health care provider tells you not to drink. ? You are pregnant, may be pregnant, or are planning to become pregnant.  If you drink alcohol: ? Limit how much you have to 0-1 drink a day. ? Be aware of how much alcohol is in your drink. In the [place] one drink equals one 12 oz bottle of beer (355 mL), one 5 oz glass of wine (148 mL), or one 1 oz glass of hard liquor (44 mL). Lifestyle  Take daily care of your teeth and gums.  Stay active. Exercise for at least 30 minutes on 5 or more days each week.  Do not use any products that contain nicotine or tobacco, such as cigarettes, e-cigarettes, and chewing tobacco. If you need help quitting, ask your health care provider.  If you are sexually active, practice safe sex. Use a condom or other form of birth control (contraception) in order to prevent pregnancy and STIs (sexually transmitted infections).  If told by your health care provider, take low-dose aspirin daily starting at age 34. What's next?  Visit your health care provider once a year for a well check  visit.  Ask your health care provider how often you should have your eyes and teeth checked.  Stay up to date on all vaccines. This information is not intended to replace advice given to you by your health care provider. Make sure you discuss any questions you have with your health care provider. Document Released: 10/24/2015 Document Revised: 06/08/2018 Document Reviewed: 06/08/2018 Elsevier Patient Education  2020 [employer].

## 2019-08-30 NOTE — Progress Notes (Signed)
   Subjective:    Patient ID: Julie Pugh, female    DOB: Feb 01, 1956, 63 y.o.   MRN: [id number]  HPI Here today for Welcome To Medicare CPE.  Risk Factors: Hypothyroid- chronic problem, on Armour thyroid 60mg  daily (managed by Dr Cruzita Lederer) Hyperlipidemia- last LDL 122, managing w/ diet and exercise Physical Activity: pickle ball, tennis, golf, walking- exercising 5 days weekly for >1 hr Fall Risk: low Depression: denies Hearing: normal to conversational tones and whispered voice ADL's: independent Cognitive: normal linear thought process, memory and attention intact Home Safety: safe at home, lives w/ husband Height, Weight, BMI, Visual Acuity: see vitals, vision corrected to 20/20 w/ glasses.  Pt just had eye exam and refuses to read eye chart Counseling: UTD on colonoscopy, mammo is scheduled.  Due for DEXA, Due for Prevnar.  Declines immunizations.  No need for pap. Advanced Directives- pt has both healthcare POA and living will  Labs Ordered: See A&P Care Plan: See A&P   Patient Care Team    Relationship Specialty Notifications Start End  [name], MD PCP - General Family Medicine  01/30/16   Philemon Kingdom, MD Consulting Physician Internal Medicine  04/02/16       Review of Systems Patient reports no vision/ hearing changes, adenopathy,fever, weight change,  persistant/recurrent hoarseness , swallowing issues, chest pain, palpitations, edema, persistant/recurrent cough, hemoptysis, dyspnea (rest/exertional/paroxysmal nocturnal), gastrointestinal bleeding (melena, rectal bleeding), abdominal pain, significant heartburn, bowel changes, GU symptoms (dysuria, hematuria, incontinence), Gyn symptoms (abnormal  bleeding, pain),  syncope, focal weakness, memory loss, numbness & tingling, skin/hair/nail changes, abnormal bruising or bleeding, anxiety, or depression.     Objective:   Physical Exam General Appearance:    Alert, cooperative, no distress, appears stated age   Head:    Normocephalic, without obvious abnormality, atraumatic  Eyes:    PERRL, conjunctiva/corneas clear, EOM's intact, fundi    benign, both eyes  Ears:    Normal TM's and external ear canals, both ears  Nose:   Deferred due to COVID  Throat:   Neck:   Supple, symmetrical, trachea midline, no adenopathy;    Thyroid: no enlargement/tenderness/nodules  Back:     Symmetric, no curvature, ROM normal, no CVA tenderness  Lungs:     Clear to auscultation bilaterally, respirations unlabored  Chest Wall:    No tenderness or deformity   Heart:    Regular rate and rhythm, S1 and S2 normal, no murmur, rub   or gallop  Breast Exam:    Deferred to mammo  Abdomen:     Soft, non-tender, bowel sounds active all four quadrants,    no masses, no organomegaly  Genitalia:    Deferred  Rectal:    Extremities:   Extremities normal, atraumatic, no cyanosis or edema  Pulses:   2+ and symmetric all extremities  Skin:   Skin color, texture, turgor normal, no rashes or lesions  Lymph nodes:   Cervical, supraclavicular, and axillary nodes normal  Neurologic:   CNII-XII intact, normal strength, sensation and reflexes    throughout          Assessment & Plan:

## 2019-08-31 ENCOUNTER — Ambulatory Visit (INDEPENDENT_AMBULATORY_CARE_PROVIDER_SITE_OTHER): Payer: Medicare Other

## 2019-08-31 DIAGNOSIS — E559 Vitamin D deficiency, unspecified: Secondary | ICD-10-CM

## 2019-08-31 DIAGNOSIS — E039 Hypothyroidism, unspecified: Secondary | ICD-10-CM

## 2019-08-31 DIAGNOSIS — E785 Hyperlipidemia, unspecified: Secondary | ICD-10-CM

## 2019-08-31 LAB — LIPID PANEL
Cholesterol: 229 mg/dL — ABNORMAL HIGH (ref 0–200)
HDL: 60.8 mg/dL (ref >39.00)
LDL Cholesterol: 151 mg/dL — ABNORMAL HIGH (ref 0–99)
NonHDL: 167.74
Total CHOL/HDL Ratio: 4
Triglycerides: 84 mg/dL (ref 0.0–149.0)
VLDL: 16.8 mg/dL (ref 0.0–40.0)

## 2019-08-31 LAB — CBC WITH DIFFERENTIAL/PLATELET
Basophils Absolute: 0 10*3/uL (ref 0.0–0.1)
Basophils Relative: 0.8 % (ref 0.0–3.0)
Eosinophils Absolute: 0.2 10*3/uL (ref 0.0–0.7)
Eosinophils Relative: 3.3 % (ref 0.0–5.0)
HCT: 43 % (ref 36.0–46.0)
Hemoglobin: 14.3 g/dL (ref 12.0–15.0)
Lymphocytes Relative: 35.1 % (ref 12.0–46.0)
Lymphs Abs: 1.9 10*3/uL (ref 0.7–4.0)
MCHC: 33.2 g/dL (ref 30.0–36.0)
MCV: 90.3 fl (ref 78.0–100.0)
Monocytes Absolute: 0.6 10*3/uL (ref 0.1–1.0)
Monocytes Relative: 11.8 % (ref 3.0–12.0)
Neutro Abs: 2.7 10*3/uL (ref 1.4–7.7)
Neutrophils Relative %: 49 % (ref 43.0–77.0)
Platelets: 268 10*3/uL (ref 150.0–400.0)
RBC: 4.76 Mil/uL (ref 3.87–5.11)
RDW: 12.8 % (ref 11.5–15.5)
WBC: 5.5 10*3/uL (ref 4.0–10.5)

## 2019-08-31 LAB — TSH: TSH: 3.53 u[IU]/mL (ref 0.35–4.50)

## 2019-08-31 LAB — HEPATIC FUNCTION PANEL
ALT: 15 U/L (ref 0–35)
AST: 14 U/L (ref 0–37)
Albumin: 4 g/dL (ref 3.5–5.2)
Alkaline Phosphatase: 61 U/L (ref 39–117)
Bilirubin, Direct: 0.1 mg/dL (ref 0.0–0.3)
Total Bilirubin: 0.6 mg/dL (ref 0.2–1.2)
Total Protein: 6.8 g/dL (ref 6.0–8.3)

## 2019-08-31 LAB — BASIC METABOLIC PANEL
BUN: 20 mg/dL (ref 6–23)
CO2: 27 mEq/L (ref 19–32)
Calcium: 9.3 mg/dL (ref 8.4–10.5)
Chloride: 105 mEq/L (ref 96–112)
Creatinine, Ser: 0.82 mg/dL (ref 0.40–1.20)
GFR: 69.83 mL/min (ref >60.00)
Glucose, Bld: 76 mg/dL (ref 70–99)
Potassium: 4.6 mEq/L (ref 3.5–5.1)
Sodium: 141 mEq/L (ref 135–145)

## 2019-08-31 LAB — VITAMIN D 25 HYDROXY (VIT D DEFICIENCY, FRACTURES): VITD: 32.51 ng/mL (ref 30.00–100.00)

## 2019-10-04 ENCOUNTER — Ambulatory Visit: Payer: Medicare Other | Attending: Internal Medicine

## 2019-10-04 DIAGNOSIS — Z20822 Contact with and (suspected) exposure to covid-19: Secondary | ICD-10-CM

## 2019-10-06 LAB — NOVEL CORONAVIRUS, NAA: SARS-CoV-2, NAA: NOT DETECTED

## 2019-10-23 ENCOUNTER — Other Ambulatory Visit: Payer: Self-pay

## 2019-10-23 ENCOUNTER — Ambulatory Visit
Admission: RE | Admit: 2019-10-23 | Discharge: 2019-10-23 | Disposition: A | Discharge status: Home / Self Care | Payer: Medicare Other | Source: Ambulatory Visit | Attending: Family Medicine | Admitting: Family Medicine

## 2019-10-23 DIAGNOSIS — Z1231 Encounter for screening mammogram for malignant neoplasm of breast: Secondary | ICD-10-CM | POA: Diagnosis not present

## 2019-11-08 ENCOUNTER — Ambulatory Visit (INDEPENDENT_AMBULATORY_CARE_PROVIDER_SITE_OTHER): Payer: Medicare Other | Admitting: Family Medicine

## 2019-11-08 ENCOUNTER — Other Ambulatory Visit: Payer: Self-pay

## 2019-11-08 ENCOUNTER — Encounter: Payer: Self-pay | Admitting: Family Medicine

## 2019-11-08 VITALS — BP 125/70 | HR 65 | Ht 67.0 in | Wt 155.0 lb

## 2019-11-08 DIAGNOSIS — R519 Headache, unspecified: Secondary | ICD-10-CM

## 2019-11-08 NOTE — Progress Notes (Signed)
I have discussed the procedure for the virtual visit with the patient who has given consent to proceed with assessment and treatment.   Julie Pugh, CMA     

## 2019-11-08 NOTE — Progress Notes (Signed)
   Virtual Visit via Video   I connected with patient on 11/08/19 at  2:00 PM EST by a video enabled telemedicine application and verified that I am speaking with the correct person using two identifiers.  Location patient: Home Location provider: Acup[facility]ist, Office Persons participating in the virtual visit: Patient, Provider, [place] (Jess B)  I discussed the limitations of evaluation and management by telemedicine and the availability of in person appointments. The patient expressed understanding and agreed to proceed.  Subjective:   HPI:   HA- 'dull HA since before Christmas'.  Reports 'it's around the top of my head', worsens at night, 'I can hear my heart beat in my head'.  A little mental 'fogginess'.  Pt reports 'the thumping' in her head will worsen when rolling over at night or when going up stairs.  No visual changes.  No dizziness.  No difficulty on tennis court.  sxs were better over the weekend when at [facility].  Pt reports runny nose every morning that resolves w/ travel.  ROS:   See pertinent positives and negatives per HPI.  Patient Active Problem List   Diagnosis Date Noted  . 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 Visit for preventive health examination 09/28/2017  . Vitamin D deficiency 05/26/2017  . Hyperlipidemia 07/10/2015  . Hormone replacement therapy 01/07/2015  . Hypothyroidism   . History of melanoma   . Varicose vein     Social History   Tobacco Use  . Smoking status: Never Smoker  . Smokeless tobacco: Never Used  Substance Use Topics  . Alcohol use: Yes    Alcohol/week: 4.0 standard drinks    Types: 4 Standard drinks or equivalent per week    Current Outpatient Medications:  .  Bioflavonoid Products (ESTER-C) 500-200-60 MG TABS, Take by mouth., Disp: , Rfl:  .  Cholecalciferol (VITAMIN D) 2000 units tablet, Take 5,000 Units by mouth daily., Disp: , Rfl:  .  METRONIDAZOLE, TOPICAL, 0.75 % LOTN, APP TO FACE AT NIGHT UTD, Disp: , Rfl:  .  Red  Yeast Rice Extract (RED YEAST RICE PO), Take by mouth., Disp: , Rfl:  .  thyroid (ARMOUR THYROID) 60 MG tablet, , Disp: , Rfl:   No Known Allergies  Objective:   BP 125/70   Pulse 65   Ht 5\' 7"  (1.702 m)   Wt 155 lb (70.3 kg)   BMI 24.28 kg/m  AAOx3, NAD NCAT, EOMI No obvious CN deficits Coloring WNL Pt is able to speak clearly, coherently without shortness of breath or increased work of breathing.  Thought process is linear.  Mood is appropriate.   Assessment and Plan:   Headache- pt reports daily, dull HA since prior to Christmas.  Her sxs seemed to improve when she went out of town to [place].  Denies any visual changes, auras, dizziness.  Suspect that this is a sinus headache b/c she also reports daily nasal congestion.  Will start w/ daily antihistamine and if no improvement will consider imaging or neuro workup.  Reviewed supportive care and red flags that should prompt return.   Pt expressed understanding and is in agreement w/ plan.    Annye Asa, MD 11/08/2019

## 2019-11-29 ENCOUNTER — Encounter: Payer: Self-pay | Admitting: Family Medicine

## 2019-11-29 DIAGNOSIS — R519 Headache, unspecified: Secondary | ICD-10-CM

## 2019-11-29 NOTE — Telephone Encounter (Signed)
Referral was placed. Pt is heading out of town on 3/3, can we see if we can get her an appointment before then?

## 2019-11-30 DIAGNOSIS — M5136 Other intervertebral disc degeneration, lumbar region: Secondary | ICD-10-CM | POA: Diagnosis not present

## 2019-11-30 DIAGNOSIS — M9903 Segmental and somatic dysfunction of lumbar region: Secondary | ICD-10-CM | POA: Diagnosis not present

## 2019-11-30 DIAGNOSIS — M9902 Segmental and somatic dysfunction of thoracic region: Secondary | ICD-10-CM | POA: Diagnosis not present

## 2019-11-30 DIAGNOSIS — M47896 Other spondylosis, lumbar region: Secondary | ICD-10-CM | POA: Diagnosis not present

## 2019-11-30 DIAGNOSIS — M9905 Segmental and somatic dysfunction of pelvic region: Secondary | ICD-10-CM | POA: Diagnosis not present

## 2019-11-30 NOTE — Telephone Encounter (Signed)
Made pt aware that we have sent the referral to [facility], they were closed Thursday and Friday. I will reach out to them on Monday.

## 2019-12-05 ENCOUNTER — Ambulatory Visit
Admission: RE | Admit: 2019-12-05 | Discharge: 2019-12-05 | Disposition: A | Discharge status: Home / Self Care | Payer: Medicare Other | Source: Ambulatory Visit | Attending: Family Medicine | Admitting: Family Medicine

## 2019-12-05 ENCOUNTER — Other Ambulatory Visit: Payer: Self-pay

## 2019-12-05 DIAGNOSIS — Z780 Asymptomatic menopausal state: Secondary | ICD-10-CM | POA: Diagnosis not present

## 2019-12-06 DIAGNOSIS — M9905 Segmental and somatic dysfunction of pelvic region: Secondary | ICD-10-CM | POA: Diagnosis not present

## 2019-12-06 DIAGNOSIS — M47896 Other spondylosis, lumbar region: Secondary | ICD-10-CM | POA: Diagnosis not present

## 2019-12-06 DIAGNOSIS — M9902 Segmental and somatic dysfunction of thoracic region: Secondary | ICD-10-CM | POA: Diagnosis not present

## 2019-12-06 DIAGNOSIS — M9903 Segmental and somatic dysfunction of lumbar region: Secondary | ICD-10-CM | POA: Diagnosis not present

## 2019-12-06 DIAGNOSIS — M5136 Other intervertebral disc degeneration, lumbar region: Secondary | ICD-10-CM | POA: Diagnosis not present

## 2020-01-01 DIAGNOSIS — M47896 Other spondylosis, lumbar region: Secondary | ICD-10-CM | POA: Diagnosis not present

## 2020-01-01 DIAGNOSIS — M9903 Segmental and somatic dysfunction of lumbar region: Secondary | ICD-10-CM | POA: Diagnosis not present

## 2020-01-01 DIAGNOSIS — M5136 Other intervertebral disc degeneration, lumbar region: Secondary | ICD-10-CM | POA: Diagnosis not present

## 2020-01-01 DIAGNOSIS — M9905 Segmental and somatic dysfunction of pelvic region: Secondary | ICD-10-CM | POA: Diagnosis not present

## 2020-01-01 DIAGNOSIS — M9902 Segmental and somatic dysfunction of thoracic region: Secondary | ICD-10-CM | POA: Diagnosis not present

## 2020-01-07 ENCOUNTER — Ambulatory Visit: Payer: Self-pay | Admitting: Neurology

## 2020-02-05 DIAGNOSIS — M5136 Other intervertebral disc degeneration, lumbar region: Secondary | ICD-10-CM | POA: Diagnosis not present

## 2020-02-05 DIAGNOSIS — M9905 Segmental and somatic dysfunction of pelvic region: Secondary | ICD-10-CM | POA: Diagnosis not present

## 2020-02-05 DIAGNOSIS — M9902 Segmental and somatic dysfunction of thoracic region: Secondary | ICD-10-CM | POA: Diagnosis not present

## 2020-02-05 DIAGNOSIS — M9903 Segmental and somatic dysfunction of lumbar region: Secondary | ICD-10-CM | POA: Diagnosis not present

## 2020-03-03 ENCOUNTER — Encounter: Payer: Self-pay | Admitting: Internal Medicine

## 2020-03-03 ENCOUNTER — Other Ambulatory Visit: Payer: Self-pay

## 2020-03-03 DIAGNOSIS — E559 Vitamin D deficiency, unspecified: Secondary | ICD-10-CM

## 2020-03-03 DIAGNOSIS — E039 Hypothyroidism, unspecified: Secondary | ICD-10-CM

## 2020-03-06 ENCOUNTER — Other Ambulatory Visit (INDEPENDENT_AMBULATORY_CARE_PROVIDER_SITE_OTHER): Payer: Medicare Other

## 2020-03-06 ENCOUNTER — Other Ambulatory Visit: Payer: Self-pay

## 2020-03-06 DIAGNOSIS — E039 Hypothyroidism, unspecified: Secondary | ICD-10-CM

## 2020-03-06 DIAGNOSIS — E559 Vitamin D deficiency, unspecified: Secondary | ICD-10-CM

## 2020-03-06 LAB — T4, FREE: Free T4: 1.21 ng/dL (ref 0.60–1.60)

## 2020-03-06 LAB — TSH: TSH: 1.83 u[IU]/mL (ref 0.35–4.50)

## 2020-03-06 NOTE — Addendum Note (Signed)
Addended by: Kaylyn Lim I on: 03/06/2020 10:04 AM   Modules accepted: Orders

## 2020-03-07 LAB — VITAMIN D 25 HYDROXY (VIT D DEFICIENCY, FRACTURES): Vit D, 25-Hydroxy: 52.3 ng/mL (ref 30.0–100.0)

## 2020-03-11 DIAGNOSIS — M5136 Other intervertebral disc degeneration, lumbar region: Secondary | ICD-10-CM | POA: Diagnosis not present

## 2020-03-11 DIAGNOSIS — M9902 Segmental and somatic dysfunction of thoracic region: Secondary | ICD-10-CM | POA: Diagnosis not present

## 2020-03-11 DIAGNOSIS — M9903 Segmental and somatic dysfunction of lumbar region: Secondary | ICD-10-CM | POA: Diagnosis not present

## 2020-03-11 DIAGNOSIS — M9905 Segmental and somatic dysfunction of pelvic region: Secondary | ICD-10-CM | POA: Diagnosis not present

## 2020-04-24 ENCOUNTER — Other Ambulatory Visit: Payer: Medicare Other

## 2020-04-24 DIAGNOSIS — Z20822 Contact with and (suspected) exposure to covid-19: Secondary | ICD-10-CM | POA: Diagnosis not present

## 2020-04-25 ENCOUNTER — Other Ambulatory Visit: Payer: Medicare Other

## 2020-04-26 ENCOUNTER — Other Ambulatory Visit: Payer: Self-pay | Admitting: Physician Assistant

## 2020-04-26 ENCOUNTER — Ambulatory Visit (HOSPITAL_COMMUNITY)
Admission: RE | Admit: 2020-04-26 | Discharge: 2020-04-26 | Disposition: A | Discharge status: Home / Self Care | Payer: Medicare Other | Source: Ambulatory Visit | Attending: Pulmonary Disease | Admitting: Pulmonary Disease

## 2020-04-26 DIAGNOSIS — R54 Age-related physical debility: Secondary | ICD-10-CM

## 2020-04-26 DIAGNOSIS — U071 COVID-19: Secondary | ICD-10-CM

## 2020-04-26 DIAGNOSIS — Z23 Encounter for immunization: Secondary | ICD-10-CM | POA: Diagnosis not present

## 2020-04-26 MED ORDER — EPINEPHRINE 0.3 MG/0.3ML IJ SOAJ: 0.3000 mg | Freq: Once | INTRAMUSCULAR | Status: DC | PRN

## 2020-04-26 MED ORDER — METHYLPREDNISOLONE SODIUM SUCC 125 MG IJ SOLR: 125.0000 mg | Freq: Once | INTRAMUSCULAR | Status: DC | PRN

## 2020-04-26 MED ORDER — FAMOTIDINE IN NACL 20-0.9 MG/50ML-% IV SOLN: 20.0000 mg | Freq: Once | INTRAVENOUS | Status: DC | PRN

## 2020-04-26 MED ORDER — ALBUTEROL SULFATE HFA 108 (90 BASE) MCG/ACT IN AERS: 2.0000 | INHALATION_SPRAY | Freq: Once | RESPIRATORY_TRACT | Status: DC | PRN

## 2020-04-26 MED ORDER — SODIUM CHLORIDE 0.9 % IV SOLN
Freq: Once | INTRAVENOUS | Status: AC
  Filled 2020-04-26: qty 600

## 2020-04-26 MED ORDER — SODIUM CHLORIDE 0.9 % IV SOLN: INTRAVENOUS | Status: DC | PRN

## 2020-04-26 MED ORDER — DIPHENHYDRAMINE HCL 50 MG/ML IJ SOLN: 50.0000 mg | Freq: Once | INTRAMUSCULAR | Status: DC | PRN

## 2020-04-26 NOTE — Progress Notes (Signed)
  Diagnosis: COVID-19  Physician: Dr. Joya Gaskins  Procedure: [facility] Med: casirivimab\imdevimab infusion - Provided patient with casirivimab\imdevimab fact sheet for patients, parents and caregivers prior to infusion.  Complications: No immediate complications noted.  Discharge: Discharged home   [place] 04/26/2020

## 2020-04-26 NOTE — Discharge Instructions (Signed)

## 2020-04-26 NOTE — Progress Notes (Signed)
I connected by phone with Julie Pugh on 04/26/2020 at 10:25 AM to discuss the potential use of a new treatment for mild to moderate COVID-19 viral infection in non-hospitalized patients.  This patient is a 64 y.o. female that meets the FDA criteria for Emergency Use Authorization of COVID monoclonal antibody casirivimab/imdevimab.  Has a (+) direct SARS-CoV-2 viral test result  Has mild or moderate COVID-19   Is NOT hospitalized due to COVID-19  Is within 10 days of symptom onset  Has at least one of the high risk factor(s) for progression to severe COVID-19 and/or hospitalization as defined in EUA.  Specific high risk criteria : Older age (>/= 66 yo)   I have spoken and communicated the following to the patient or parent/caregiver regarding COVID monoclonal antibody treatment:  1. FDA has authorized the emergency use for the treatment of mild to moderate COVID-19 in adults and pediatric patients with positive results of direct SARS-CoV-2 viral testing who are 6 years of age and older weighing at least 40 kg, and who are at high risk for progressing to severe COVID-19 and/or hospitalization.  2. The significant known and potential risks and benefits of COVID monoclonal antibody, and the extent to which such potential risks and benefits are unknown.  3. Information on available alternative treatments and the risks and benefits of those alternatives, including clinical trials.  4. Patients treated with COVID monoclonal antibody should continue to self-isolate and use infection control measures (e.g., wear mask, isolate, social distance, avoid sharing personal items, clean and disinfect "high touch" surfaces, and frequent handwashing) according to CDC guidelines.   5. The patient or parent/caregiver has the option to accept or refuse COVID monoclonal antibody treatment.  After reviewing this information with the patient, The patient agreed to proceed with receiving  casirivimab\imdevimab infusion and will be provided a copy of the Fact sheet prior to receiving the infusion.    Sx onset 7/13. Set up for infusion today at 12:30pm at [facility] infusion center.   Angelena Form 04/26/2020 10:25 AM

## 2020-05-15 ENCOUNTER — Telehealth: Payer: Self-pay | Admitting: Internal Medicine

## 2020-05-15 NOTE — Telephone Encounter (Signed)
Medication Rx Request  Did you call your pharmacy and request this refill first? Yes  . If patient has not contacted pharmacy first, instruct them to do so for future refills.  . Remind them that contacting the pharmacy for their refill is the quickest method to get the refill.  . Refill policy also stated that it will take anywhere between 24-72 hours to receive the refill.    Name of medication? Levothyroxine   Is this a 90 day supply? No - only 12 needed - patient is on vacation there.  Name and location of pharmacy?   [place] [place]

## 2020-05-15 NOTE — Telephone Encounter (Signed)
Please send prescription as requested

## 2020-05-15 NOTE — Telephone Encounter (Signed)
Patient [place] levothyroxine [place] of state pharmacy. Please advise.

## 2020-05-16 ENCOUNTER — Other Ambulatory Visit: Payer: Self-pay

## 2020-05-16 DIAGNOSIS — E039 Hypothyroidism, unspecified: Secondary | ICD-10-CM

## 2020-05-16 MED ORDER — THYROID 60 MG PO TABS: 60.0000 mg | ORAL_TABLET | Freq: Every day | ORAL | 0 refills | Status: DC

## 2020-05-16 NOTE — Telephone Encounter (Signed)
Outpatient Medication Detail   Disp Refills Start End   thyroid [facility] THYROID) 60 MG tablet 30 tablet 0 05/16/2020    Sig - Route: Take 1 tablet (60 mg total) by mouth daily before breakfast. - Oral   Sent to pharmacy as: thyroid (ARMOUR THYROID) 60 MG tablet   E-Prescribing Status: Receipt confirmed by pharmacy (05/16/2020  7:47 AM EDT)

## 2020-06-03 ENCOUNTER — Ambulatory Visit: Payer: Medicare Other | Admitting: Internal Medicine

## 2020-06-03 ENCOUNTER — Encounter: Payer: Self-pay | Admitting: Internal Medicine

## 2020-06-03 ENCOUNTER — Other Ambulatory Visit: Payer: Self-pay

## 2020-06-03 VITALS — BP 110/60 | HR 60 | Ht 67.0 in | Wt 155.0 lb

## 2020-06-03 DIAGNOSIS — E039 Hypothyroidism, unspecified: Secondary | ICD-10-CM

## 2020-06-03 DIAGNOSIS — E559 Vitamin D deficiency, unspecified: Secondary | ICD-10-CM

## 2020-06-03 MED ORDER — LEVOTHYROXINE SODIUM 100 MCG PO TABS: 100.0000 ug | ORAL_TABLET | Freq: Every day | ORAL | 3 refills | Status: DC

## 2020-06-03 NOTE — Patient Instructions (Addendum)
Please continue Levothyroxine 100 mcg daily.  Take the thyroid hormone every day, with water, at least 30 minutes before breakfast, separated by at least 4 hours from: - acid reflux medications - calcium - iron - multivitamins  Please continue vitamin D daily.  Please return in 1 year. 

## 2020-06-03 NOTE — Progress Notes (Signed)
Patient ID: Julie Pugh, female   DOB: 11-Sep-1956, 64 y.o.   MRN: [id number]  This visit occurred during the SARS-CoV-2 public health emergency.  Safety protocols were in place, including screening questions prior to the visit, additional usage of staff PPE, and extensive cleaning of exam room while observing appropriate contact time as indicated for disinfecting solutions.   HPI  Julie Pugh is a 64 y.o.-year-old female, returning for f/u for hypothyroidism and vitamin D insufficiency. Last visit 1 year ago.  She was dx'ed with Covid 19 in 04/2020 (she was not vaccinated) >> recovered now.  Reviewed history: Pt. has been dx with hypothyroidism in 2012 (foggy mind, weight gain, fatigue); is on Armour 60 mg (equivalent of Levothyroxine 100 mcg), but was off 2016 Summer >> TSH in the Fall of 2016: 3.33 >> repeat TSH 1.83.  She was then restarted on Armour with maintenance of normal TFTs (?).  She changed to LT4 in 11/2019 per insurance preference.  Pt is on Levothyroxine 100 daily, taken: - in am (5-6 AM) - fasting - at least 30 min from b'fast - no Ca, Fe, PPIs, + occasional multivitamins later in the day (off during the summer) - not on Biotin  Reviewed patient TFTs: Lab Results  Component Value Date   TSH 1.83 03/06/2020   TSH 3.53 08/31/2019   TSH 1.46 06/04/2019   TSH 1.58 05/10/2018   TSH 2.63 09/28/2017   TSH 0.97 02/21/2017   TSH 1.31 05/25/2016   TSH 1.31 11/26/2015   TSH 1.83 09/29/2015   TSH 3.33 07/03/2015   TSH 2.9 06/28/2014   Lab Results  Component Value Date   FREET4 1.21 03/06/2020   FREET4 0.91 06/04/2019   FREET4 0.75 05/10/2018   FREET4 0.74 02/21/2017   FREET4 0.83 05/25/2016   FREET4 0.82 11/26/2015   Lab Results  Component Value Date   T3FREE 3.4 06/04/2019   T3FREE 3.8 05/10/2018   T3FREE 4.2 02/21/2017   T3FREE 3.8 05/25/2016   T3FREE 3.4 11/26/2015   T3FREE 2.7 06/28/2014   Pt denies: - feeling nodules in neck - hoarseness -  dysphagia - choking - SOB with lying down  No FH of thyroid ds. Or thyroid cancer. No h/o radiation tx to head or neck.  No seaweed or kelp. No recent contrast studies. No herbal supplements. No Biotin use. No recent steroids use.   Vitamin D def:  Reviewed vitamin D levels: Lab Results  Component Value Date   VD25OH 52.3 03/06/2020   VD25OH 32.51 08/31/2019   VD25OH 27.37 (L) 06/04/2019   VD25OH 35.51 09/28/2017   VD25OH 51.77 02/21/2017   VD25OH 24.65 (L) 05/25/2016   VD25OH 29.33 (L) 11/26/2015   On 5000 units vitamin D daily.  She stopped HRT 02/2015.  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  She refused to restart a statin. She walks and plays tennis. Eats little meat, but does eat icecream, cheese.  In October 2019 she went to [place] with her sister, as part of an organized tour.  ROS: Constitutional: no weight gain/no weight loss, no fatigue, no subjective hyperthermia, no subjective hypothermia Eyes: no blurry vision, no xerophthalmia ENT: no sore throat, + see HPI Cardiovascular: no CP/no SOB/no palpitations/no leg swelling Respiratory: no cough/no SOB/no wheezing Gastrointestinal: no N/no V/no D/no C/no acid reflux Musculoskeletal: no muscle aches/no joint aches Skin: no rashes, no hair loss Neurological: no tremors/no numbness/no tingling/no dizziness  I reviewed pt's medications, allergies, PMH, social hx, family hx,  and changes were documented in the history of present illness. Otherwise, unchanged from my initial visit note.  Past Medical History:  Diagnosis Date  . Hx of melanoma of skin   . Hyperlipidemia   . Hypothyroidism    Dr. Jaynie Collins managing. armour thyroid 60mg    . Melanoma ([place])    R shin 2009. sees derm q6 months  . Varicose vein    Past Surgical History:  Procedure Laterality Date  . BREAST BIOPSY     benign 2000-stereotactic biopsy  . BREAST BIOPSY Right 03/12/2004  . VARICOSE VEIN SURGERY     2001   Social History  Main Topics  . Smoking status: Never Smoker   . Smokeless tobacco: Not on file  . Alcohol Use:     3 Standard drinks or equivalent per week  . Drug Use: No   Social History Narrative   Married (marriage is a stressor). 1 daughter Cloyde Reams at [employer] age 32 in 2016.       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       Hobbies: tennis, walking, time with dogs, pickleball. Reading. Outdoors.    Current Outpatient Medications on File Prior to Visit  Medication Sig Dispense Refill  . Bioflavonoid Products (ESTER-C) 270-350-09 MG TABS Take by mouth.    . Cholecalciferol (VITAMIN D) 2000 units tablet Take 5,000 Units by mouth daily.    Marland Kitchen METRONIDAZOLE, TOPICAL, 0.75 % LOTN APP TO FACE AT NIGHT UTD    . Red Yeast Rice Extract (RED YEAST RICE PO) Take by mouth.    . thyroid (ARMOUR THYROID) 60 MG tablet Take 1 tablet (60 mg total) by mouth daily before breakfast. 30 tablet 0   No current facility-administered medications on file prior to visit.   No Known Allergies Family History  Problem Relation Age of Onset  . Heart attack Father        bypass 25, 1 fatal MI, former smoker  . Panic disorder Mother   . COPD Mother        smoked until 62  . Congestive Heart Failure Mother        pacemaker  . Hypertension Mother   . Hyperlipidemia Sister    PE: BP 110/60   Pulse 60   Ht 5\' 7"  (1.702 m)   Wt 155 lb (70.3 kg)   SpO2 97%   BMI 24.28 kg/m  Wt Readings from Last 3 Encounters:  06/03/20 155 lb (70.3 kg)  11/08/19 155 lb (70.3 kg)  08/30/19 154 lb 8 oz (70.1 kg)   Constitutional: normal weight, in NAD Eyes: PERRLA, EOMI, no exophthalmos ENT: moist mucous membranes, no thyromegaly, no cervical lymphadenopathy Cardiovascular: RRR, No MRG Respiratory: CTA B Gastrointestinal: abdomen soft, NT, ND, BS+ Musculoskeletal: no deformities, strength intact in all 4 Skin: moist, warm, no rashes Neurological: no tremor with outstretched hands,  DTR normal in all 4  ASSESSMENT: 1. Hypothyroidism  2. Vitamin D insufficiency  PLAN:  1. Patient with history of hypothyroidism, on desiccated thyroid extract..  She has an unusual history of being off thyroid hormones for the whole summer 2016, however TSH checked 06/2015 returned normal at 3.33.  She was then started on a full dose of Armour 60 mg daily (equivalent of 100 mcg of levothyroxine) and her TFTs returned normal afterwards, however, in 11/2018 we had to switch back to levothyroxine per insurance preference.  She does not feel a difference after changing the formulation. -  latest thyroid labs reviewed with pt >> normal: Lab Results  Component Value Date   TSH 1.83 03/06/2020   - she continues on levothyroxine 100 mcg daily - pt feels good on this dose. - we discussed about taking the thyroid hormone every day, with water, >30 minutes before breakfast, separated by >4 hours from acid reflux medications, calcium, iron, multivitamins. Pt. is taking it correctly. - will check thyroid tests in 08/2020 at her next visit with PCP, per her preference. - I will see her back in 1 year  2. Vit D insufficiency -At last visit, she was not getting the 5000 units vitamin D daily as recommended, but only 2000 to 4000 units daily. -A year ago her vitamin D level was still slightly low, at 27.37 so I advised him to take the vitamin D daily and increase the dose to 5000 units daily.  She is taking this dose now. -Reviewed latest vitamin D level from 02/2020 and this was normal - will repeat this at next visit  Philemon Kingdom, MD PhD [facility]

## 2020-06-06 ENCOUNTER — Ambulatory Visit: Payer: Medicare Other | Admitting: Family Medicine

## 2020-06-11 DIAGNOSIS — M9905 Segmental and somatic dysfunction of pelvic region: Secondary | ICD-10-CM | POA: Diagnosis not present

## 2020-06-11 DIAGNOSIS — M9903 Segmental and somatic dysfunction of lumbar region: Secondary | ICD-10-CM | POA: Diagnosis not present

## 2020-06-11 DIAGNOSIS — M9902 Segmental and somatic dysfunction of thoracic region: Secondary | ICD-10-CM | POA: Diagnosis not present

## 2020-06-11 DIAGNOSIS — M5136 Other intervertebral disc degeneration, lumbar region: Secondary | ICD-10-CM | POA: Diagnosis not present

## 2020-06-12 ENCOUNTER — Ambulatory Visit (INDEPENDENT_AMBULATORY_CARE_PROVIDER_SITE_OTHER): Payer: Medicare Other | Admitting: Family Medicine

## 2020-06-12 ENCOUNTER — Other Ambulatory Visit: Payer: Self-pay

## 2020-06-12 ENCOUNTER — Encounter: Payer: Self-pay | Admitting: Family Medicine

## 2020-06-12 VITALS — BP 123/63 | HR 53 | Temp 97.8°F | Resp 16 | Ht 67.0 in | Wt 157.4 lb

## 2020-06-12 DIAGNOSIS — R5383 Other fatigue: Secondary | ICD-10-CM

## 2020-06-12 DIAGNOSIS — E785 Hyperlipidemia, unspecified: Secondary | ICD-10-CM | POA: Diagnosis not present

## 2020-06-12 DIAGNOSIS — Z8616 Personal history of COVID-19: Secondary | ICD-10-CM

## 2020-06-12 NOTE — Assessment & Plan Note (Signed)
Pt has hx of high cholesterol and was to be taking Red Yeast Rice but she admits she hasn't done this since February.  Will check labs and determine if medication is needed.

## 2020-06-12 NOTE — Assessment & Plan Note (Signed)
New.  Pt had COVID in July and was tx'd w/ antibody infusion.  She continues to have decreased/altered taste/smell and ongoing fatigue.  Encouraged pt to get vaccinated 90 days after her antibody infusion

## 2020-06-12 NOTE — Progress Notes (Signed)
° °  Subjective:    Patient ID: Julie Pugh, female    DOB: September 02, 1956, 64 y.o.   MRN: [id number]  HPI Hyperlipidemia- ongoing issue.  Last LDL 151.  Has 'not been good' about taking Red Yeast Rice, 'only took it until about Feb'.  Continues to exercise regularly.  No CP, abd pain, N/V, SOB, HAs, visual changes.   Fatigue- pt had 'major fatigue' during COVID but had some fatigue prior to illness.  Continues to have some level of fatigue currently.  Is interested in B12 level.  Hx of COVID- pt was treated w/ antibody infusion after declining vaccines.  Not having persistent SOB.  Taste and smell are slowly returning.     Review of Systems For ROS see HPI   This visit occurred during the SARS-CoV-2 public health emergency.  Safety protocols were in place, including screening questions prior to the visit, additional usage of staff PPE, and extensive cleaning of exam room while observing appropriate contact time as indicated for disinfecting solutions.       Objective:   Physical Exam Vitals reviewed.  Constitutional:      General: She is not in acute distress.    Appearance: She is well-developed.  HENT:     Head: Normocephalic and atraumatic.  Eyes:     Conjunctiva/sclera: Conjunctivae normal.     Pupils: Pupils are equal, round, and reactive to light.  Neck:     Thyroid: No thyromegaly.  Cardiovascular:     Rate and Rhythm: Normal rate and regular rhythm.     Heart sounds: Normal heart sounds. No murmur heard.   Pulmonary:     Effort: Pulmonary effort is normal. No respiratory distress.     Breath sounds: Normal breath sounds.  Abdominal:     General: There is no distension.     Palpations: Abdomen is soft.     Tenderness: There is no abdominal tenderness.  Musculoskeletal:     Cervical back: Normal range of motion and neck supple.  Lymphadenopathy:     Cervical: No cervical adenopathy.  Skin:    General: Skin is warm and dry.  Neurological:     Mental Status: She is  alert and oriented to person, place, and time.  Psychiatric:        Behavior: Behavior normal.           Assessment & Plan:  Fatigue- ongoing issue for pt.  Worse since COVID in July but she had sxs prior to illness.  Check labs to assess for metabolic cause.  Will follow.

## 2020-06-12 NOTE — Patient Instructions (Addendum)
Schedule a lab visit for tomorrow Follow up as needed or as scheduled We'll notify you of your lab results and make any changes if needed Continue to work on healthy diet and regular exercise- you're doing great! Please consider getting your vaccine 90 days after your antibody infusion Call with any questions or concerns Have a great holiday weekend!

## 2020-06-13 ENCOUNTER — Other Ambulatory Visit: Payer: Self-pay

## 2020-06-13 ENCOUNTER — Ambulatory Visit (INDEPENDENT_AMBULATORY_CARE_PROVIDER_SITE_OTHER): Payer: Medicare Other

## 2020-06-13 DIAGNOSIS — R5383 Other fatigue: Secondary | ICD-10-CM

## 2020-06-13 DIAGNOSIS — E785 Hyperlipidemia, unspecified: Secondary | ICD-10-CM | POA: Diagnosis not present

## 2020-06-13 LAB — CBC WITH DIFFERENTIAL/PLATELET
Basophils Absolute: 0 10*3/uL (ref 0.0–0.1)
Basophils Relative: 0.9 % (ref 0.0–3.0)
Eosinophils Absolute: 0.2 10*3/uL (ref 0.0–0.7)
Eosinophils Relative: 3.8 % (ref 0.0–5.0)
HCT: 40.8 % (ref 36.0–46.0)
Hemoglobin: 13.7 g/dL (ref 12.0–15.0)
Lymphocytes Relative: 37.8 % (ref 12.0–46.0)
Lymphs Abs: 1.8 10*3/uL (ref 0.7–4.0)
MCHC: 33.6 g/dL (ref 30.0–36.0)
MCV: 90.6 fl (ref 78.0–100.0)
Monocytes Absolute: 0.5 10*3/uL (ref 0.1–1.0)
Monocytes Relative: 11 % (ref 3.0–12.0)
Neutro Abs: 2.2 10*3/uL (ref 1.4–7.7)
Neutrophils Relative %: 46.5 % (ref 43.0–77.0)
Platelets: 274 10*3/uL (ref 150.0–400.0)
RBC: 4.5 Mil/uL (ref 3.87–5.11)
RDW: 13 % (ref 11.5–15.5)
WBC: 4.7 10*3/uL (ref 4.0–10.5)

## 2020-06-13 LAB — BASIC METABOLIC PANEL
BUN: 21 mg/dL (ref 6–23)
CO2: 27 mEq/L (ref 19–32)
Calcium: 9.2 mg/dL (ref 8.4–10.5)
Chloride: 105 mEq/L (ref 96–112)
Creatinine, Ser: 0.91 mg/dL (ref 0.40–1.20)
GFR: 61.78 mL/min (ref >60.00)
Glucose, Bld: 79 mg/dL (ref 70–99)
Potassium: 4.3 mEq/L (ref 3.5–5.1)
Sodium: 141 mEq/L (ref 135–145)

## 2020-06-13 LAB — HEPATIC FUNCTION PANEL
ALT: 12 U/L (ref 0–35)
AST: 13 U/L (ref 0–37)
Albumin: 3.9 g/dL (ref 3.5–5.2)
Alkaline Phosphatase: 53 U/L (ref 39–117)
Bilirubin, Direct: 0.1 mg/dL (ref 0.0–0.3)
Total Bilirubin: 0.6 mg/dL (ref 0.2–1.2)
Total Protein: 6.3 g/dL (ref 6.0–8.3)

## 2020-06-13 LAB — LIPID PANEL
Cholesterol: 219 mg/dL — ABNORMAL HIGH (ref 0–200)
HDL: 55.4 mg/dL (ref >39.00)
LDL Cholesterol: 142 mg/dL — ABNORMAL HIGH (ref 0–99)
NonHDL: 163.66
Total CHOL/HDL Ratio: 4
Triglycerides: 109 mg/dL (ref 0.0–149.0)
VLDL: 21.8 mg/dL (ref 0.0–40.0)

## 2020-06-13 LAB — B12 AND FOLATE PANEL
Folate: 20.2 ng/mL (ref >5.9)
Vitamin B-12: 251 pg/mL (ref 211–911)

## 2020-06-17 DIAGNOSIS — D2272 Melanocytic nevi of left lower limb, including hip: Secondary | ICD-10-CM | POA: Diagnosis not present

## 2020-06-17 DIAGNOSIS — L821 Other seborrheic keratosis: Secondary | ICD-10-CM | POA: Diagnosis not present

## 2020-06-17 DIAGNOSIS — D2261 Melanocytic nevi of right upper limb, including shoulder: Secondary | ICD-10-CM | POA: Diagnosis not present

## 2020-06-17 DIAGNOSIS — D225 Melanocytic nevi of trunk: Secondary | ICD-10-CM | POA: Diagnosis not present

## 2020-06-17 DIAGNOSIS — L820 Inflamed seborrheic keratosis: Secondary | ICD-10-CM | POA: Diagnosis not present

## 2020-06-17 DIAGNOSIS — L718 Other rosacea: Secondary | ICD-10-CM | POA: Diagnosis not present

## 2020-06-18 ENCOUNTER — Telehealth: Payer: Self-pay | Admitting: Family Medicine

## 2020-06-18 NOTE — Telephone Encounter (Signed)
Heather an NP with [facility] called in stating that while doing their in home screening. They did an peripheral artery disease screening. Pt showed mild decreased circulation in the LLE. She is asymptomatic and there is no known history of PAD or PVD, she wanted to inform Dr. Birdie Riddle of their findings.   For questions please call [facility] at [phone]

## 2020-06-18 NOTE — Telephone Encounter (Signed)
fyi

## 2020-07-17 DIAGNOSIS — M532X7 Spinal instabilities, lumbosacral region: Secondary | ICD-10-CM | POA: Diagnosis not present

## 2020-07-22 DIAGNOSIS — M532X7 Spinal instabilities, lumbosacral region: Secondary | ICD-10-CM | POA: Diagnosis not present

## 2020-07-25 DIAGNOSIS — M532X7 Spinal instabilities, lumbosacral region: Secondary | ICD-10-CM | POA: Diagnosis not present

## 2020-08-03 ENCOUNTER — Other Ambulatory Visit: Payer: Self-pay | Admitting: Internal Medicine

## 2020-08-08 DIAGNOSIS — M532X7 Spinal instabilities, lumbosacral region: Secondary | ICD-10-CM | POA: Diagnosis not present

## 2020-09-02 ENCOUNTER — Ambulatory Visit: Payer: Medicare Other | Admitting: Family Medicine

## 2020-09-11 ENCOUNTER — Ambulatory Visit: Payer: Medicare Other | Admitting: Family Medicine

## 2020-09-25 ENCOUNTER — Ambulatory Visit (INDEPENDENT_AMBULATORY_CARE_PROVIDER_SITE_OTHER): Payer: Medicare Other | Admitting: Family Medicine

## 2020-09-25 ENCOUNTER — Encounter: Payer: Self-pay | Admitting: Family Medicine

## 2020-09-25 ENCOUNTER — Other Ambulatory Visit: Payer: Self-pay

## 2020-09-25 VITALS — BP 122/80 | HR 65 | Temp 98.3°F | Resp 16 | Ht 65.0 in | Wt 154.6 lb

## 2020-09-25 DIAGNOSIS — E785 Hyperlipidemia, unspecified: Secondary | ICD-10-CM | POA: Diagnosis not present

## 2020-09-25 DIAGNOSIS — Z0000 Encounter for general adult medical examination without abnormal findings: Secondary | ICD-10-CM | POA: Diagnosis not present

## 2020-09-25 DIAGNOSIS — E559 Vitamin D deficiency, unspecified: Secondary | ICD-10-CM | POA: Diagnosis not present

## 2020-09-25 NOTE — Assessment & Plan Note (Signed)
Chronic problem.  Taking Red Yeast Rice daily.  Check labs and start meds prn.

## 2020-09-25 NOTE — Assessment & Plan Note (Signed)
Pt has hx of this.  Check labs and replete prn. 

## 2020-09-25 NOTE — Assessment & Plan Note (Signed)
Pt's PE WNL.  UTD on mammo, colonoscopy, DEXA.  Declines all vaccinations at this time.  Check labs.  Anticipatory guidance provided.

## 2020-09-25 NOTE — Progress Notes (Signed)
   Subjective:    Patient ID: Julie Pugh, female    DOB: 1956/01/07, 64 y.o.   MRN: [id number]  HPI CPE- Declines flu, COVID, PNA vaccines.  UTD on mammogram, colonoscopy, DEXA.  Reviewed past medical, surgical, family and social histories.   Patient Care Team    Relationship Specialty Notifications Start End  [name], MD PCP - General Family Medicine  01/30/16   Philemon Kingdom, MD Consulting Physician Internal Medicine  04/02/16     Health Maintenance  Topic Date Due  . TETANUS/TDAP  Never done  . COVID-19 Vaccine (1) 10/11/2020 (Originally 01/21/1968)  . INFLUENZA VACCINE  07/10/2035 (Originally 05/11/2020)  . PNA vac Low Risk Adult (1 of 2 - PCV13) 08/29/2098 (Originally 01/22/2019)  . Hepatitis C Screening  10/07/2098 (Originally 1956-03-12)  . MAMMOGRAM  10/22/2021  . COLONOSCOPY  07/18/2029  . DEXA SCAN  Completed      Review of Systems Patient reports no vision/ hearing changes, adenopathy,fever, weight change,  persistant/recurrent hoarseness , swallowing issues, chest pain, palpitations, edema, persistant/recurrent cough, hemoptysis, dyspnea (rest/exertional/paroxysmal nocturnal), gastrointestinal bleeding (melena, rectal bleeding), abdominal pain, significant heartburn, bowel changes, GU symptoms (dysuria, hematuria, incontinence), Gyn symptoms (abnormal  bleeding, pain),  syncope, focal weakness, memory loss, numbness & tingling, skin/hair/nail changes, abnormal bruising or bleeding, anxiety, or depression.   This visit occurred during the SARS-CoV-2 public health emergency.  Safety protocols were in place, including screening questions prior to the visit, additional usage of staff PPE, and extensive cleaning of exam room while observing appropriate contact time as indicated for disinfecting solutions.       Objective:   Physical Exam General Appearance:    Alert, cooperative, no distress, appears stated age  Head:    Normocephalic, without obvious  abnormality, atraumatic  Eyes:    PERRL, conjunctiva/corneas clear, EOM's intact, fundi    benign, both eyes  Ears:    Normal TM's and external ear canals, both ears  Nose:   Deferred due to COVID  Throat:   Neck:   Supple, symmetrical, trachea midline, no adenopathy;    Thyroid: no enlargement/tenderness/nodules  Back:     Symmetric, no curvature, ROM normal, no CVA tenderness  Lungs:     Clear to auscultation bilaterally, respirations unlabored  Chest Wall:    No tenderness or deformity   Heart:    Regular rate and rhythm, S1 and S2 normal, no murmur, rub   or gallop  Breast Exam:    Deferred to mammo  Abdomen:     Soft, non-tender, bowel sounds active all four quadrants,    no masses, no organomegaly  Genitalia:    Deferred  Rectal:    Extremities:   Extremities normal, atraumatic, no cyanosis or edema  Pulses:   2+ and symmetric all extremities  Skin:   Skin color, texture, turgor normal, no rashes or lesions  Lymph nodes:   Cervical, supraclavicular, and axillary nodes normal  Neurologic:   CNII-XII intact, normal strength, sensation and reflexes    throughout          Assessment & Plan:

## 2020-09-25 NOTE — Patient Instructions (Addendum)
Follow up in 1 year or as needed Please schedule a lab visit at your convenience (location of your choice) or go to [place] for labs (no appt needed) We'll notify you of your lab results and make any changes if needed Continue to work on healthy diet and regular exercise- you look great! Call with any questions or concerns Stay Safe!  Stay Healthy! Happy Holidays!!

## 2020-09-26 ENCOUNTER — Other Ambulatory Visit: Payer: Self-pay | Admitting: Family Medicine

## 2020-09-26 DIAGNOSIS — Z1231 Encounter for screening mammogram for malignant neoplasm of breast: Secondary | ICD-10-CM

## 2020-10-07 DIAGNOSIS — M532X7 Spinal instabilities, lumbosacral region: Secondary | ICD-10-CM | POA: Diagnosis not present

## 2020-10-08 ENCOUNTER — Other Ambulatory Visit (INDEPENDENT_AMBULATORY_CARE_PROVIDER_SITE_OTHER): Payer: Medicare Other

## 2020-10-08 DIAGNOSIS — E559 Vitamin D deficiency, unspecified: Secondary | ICD-10-CM | POA: Diagnosis not present

## 2020-10-08 DIAGNOSIS — E785 Hyperlipidemia, unspecified: Secondary | ICD-10-CM | POA: Diagnosis not present

## 2020-10-08 LAB — HEPATIC FUNCTION PANEL
ALT: 16 U/L (ref 0–35)
AST: 16 U/L (ref 0–37)
Albumin: 4.1 g/dL (ref 3.5–5.2)
Alkaline Phosphatase: 65 U/L (ref 39–117)
Bilirubin, Direct: 0.1 mg/dL (ref 0.0–0.3)
Total Bilirubin: 0.7 mg/dL (ref 0.2–1.2)
Total Protein: 7 g/dL (ref 6.0–8.3)

## 2020-10-08 LAB — LIPID PANEL
Cholesterol: 223 mg/dL — ABNORMAL HIGH (ref 0–200)
HDL: 63.7 mg/dL (ref >39.00)
LDL Cholesterol: 141 mg/dL — ABNORMAL HIGH (ref 0–99)
NonHDL: 159.76
Total CHOL/HDL Ratio: 4
Triglycerides: 92 mg/dL (ref 0.0–149.0)
VLDL: 18.4 mg/dL (ref 0.0–40.0)

## 2020-10-08 LAB — BASIC METABOLIC PANEL
BUN: 18 mg/dL (ref 6–23)
CO2: 27 mEq/L (ref 19–32)
Calcium: 9.4 mg/dL (ref 8.4–10.5)
Chloride: 105 mEq/L (ref 96–112)
Creatinine, Ser: 0.87 mg/dL (ref 0.40–1.20)
GFR: 69.29 mL/min (ref >60.00)
Glucose, Bld: 89 mg/dL (ref 70–99)
Potassium: 4.4 mEq/L (ref 3.5–5.1)
Sodium: 140 mEq/L (ref 135–145)

## 2020-10-08 LAB — CBC WITH DIFFERENTIAL/PLATELET
Basophils Absolute: 0 10*3/uL (ref 0.0–0.1)
Basophils Relative: 0.6 % (ref 0.0–3.0)
Eosinophils Absolute: 0.2 10*3/uL (ref 0.0–0.7)
Eosinophils Relative: 2.1 % (ref 0.0–5.0)
HCT: 40.7 % (ref 36.0–46.0)
Hemoglobin: 14 g/dL (ref 12.0–15.0)
Lymphocytes Relative: 21.7 % (ref 12.0–46.0)
Lymphs Abs: 1.5 10*3/uL (ref 0.7–4.0)
MCHC: 34.3 g/dL (ref 30.0–36.0)
MCV: 87.5 fl (ref 78.0–100.0)
Monocytes Absolute: 0.8 10*3/uL (ref 0.1–1.0)
Monocytes Relative: 11.2 % (ref 3.0–12.0)
Neutro Abs: 4.5 10*3/uL (ref 1.4–7.7)
Neutrophils Relative %: 64.4 % (ref 43.0–77.0)
Platelets: 260 10*3/uL (ref 150.0–400.0)
RBC: 4.66 Mil/uL (ref 3.87–5.11)
RDW: 12.7 % (ref 11.5–15.5)
WBC: 7 10*3/uL (ref 4.0–10.5)

## 2020-10-08 LAB — TSH: TSH: 2.03 u[IU]/mL (ref 0.35–4.50)

## 2020-10-08 LAB — VITAMIN D 25 HYDROXY (VIT D DEFICIENCY, FRACTURES): VITD: 53.58 ng/mL (ref 30.00–100.00)

## 2020-11-05 ENCOUNTER — Other Ambulatory Visit: Payer: Self-pay

## 2020-11-05 ENCOUNTER — Ambulatory Visit
Admission: RE | Admit: 2020-11-05 | Discharge: 2020-11-05 | Disposition: A | Discharge status: Home / Self Care | Payer: Medicare Other | Source: Ambulatory Visit | Attending: Family Medicine | Admitting: Family Medicine

## 2020-11-05 DIAGNOSIS — Z1231 Encounter for screening mammogram for malignant neoplasm of breast: Secondary | ICD-10-CM

## 2020-11-24 ENCOUNTER — Other Ambulatory Visit: Payer: Self-pay | Admitting: Internal Medicine

## 2020-11-26 DIAGNOSIS — I8002 Phlebitis and thrombophlebitis of superficial vessels of left lower extremity: Secondary | ICD-10-CM | POA: Diagnosis not present

## 2020-11-26 DIAGNOSIS — I8392 Asymptomatic varicose veins of left lower extremity: Secondary | ICD-10-CM | POA: Diagnosis not present

## 2020-11-26 DIAGNOSIS — I824Y2 Acute embolism and thrombosis of unspecified deep veins of left proximal lower extremity: Secondary | ICD-10-CM | POA: Diagnosis not present

## 2021-02-02 ENCOUNTER — Ambulatory Visit (INDEPENDENT_AMBULATORY_CARE_PROVIDER_SITE_OTHER): Payer: Medicare Other | Admitting: Family Medicine

## 2021-02-02 ENCOUNTER — Encounter: Payer: Self-pay | Admitting: Family Medicine

## 2021-02-02 DIAGNOSIS — J069 Acute upper respiratory infection, unspecified: Secondary | ICD-10-CM

## 2021-02-02 MED ORDER — AZITHROMYCIN 250 MG PO TABS: ORAL_TABLET | ORAL | 0 refills | Status: DC

## 2021-02-02 NOTE — Progress Notes (Signed)
Virtual Visit via audio Note  I connected with Julie Pugh on 02/02/21 at 4:58 PM by audio - no video  application and verified that I am speaking with the correct person using two identifiers.  Patient location: home My location:  office  I discussed the limitations, risks, security and privacy concerns of performing an evaluation and management service by telephone and the availability of in person appointments. I also discussed with the patient that there may be a patient responsible charge related to this service. The patient expressed understanding and agreed to proceed, consent obtained  Chief complaint:  Chief Complaint  Patient presents with  . Nasal Congestion    Pt reports sinus drainage, yellow or green mucus from nose and watery eyes, started about 1 week ago, pt denies fever and chills, reports at home test negative on Saturday      History of Present Illness: Julie Pugh is a 65 y.o. female   Initial runny nose past 6 days. Increasing nasal congestion, now thicker yellow nasal d/c and phlegm. Feeling run down. Tired. No fever. No HA/face pain.  Eating and drinking ok.  Home rapid covid test negative 2 days ago. Plans to repeat tonight. Has not been vaccinated against Covid, but had Covid infection in July 2021.  No usual allergy issues.  No known sick contacts, but around others in [place].  Min cough - more with sneeze or bringing up congestion.    Patient Active Problem List   Diagnosis Date Noted  . History of COVID-19 06/12/2020  . 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 Visit for preventive health examination 09/28/2017  . Vitamin D deficiency 05/26/2017  . Hyperlipidemia 07/10/2015  . Hormone replacement therapy 01/07/2015  . Hypothyroidism   . History of melanoma   . Varicose vein    Past Medical History:  Diagnosis Date  . Hx of melanoma of skin   . Hyperlipidemia   . Hypothyroidism    Dr. Jaynie Collins managing. armour thyroid 60mg    . Melanoma ([place])     R shin 2009. sees derm q6 months  . Varicose vein    Past Surgical History:  Procedure Laterality Date  . BREAST BIOPSY     benign 2000-stereotactic biopsy  . BREAST BIOPSY Right 03/12/2004  . VARICOSE VEIN SURGERY     2001   No Known Allergies Prior to Admission medications   Medication Sig Start Date End Date Taking? Authorizing Provider  Cholecalciferol (VITAMIN D) 2000 units tablet Take 5,000 Units by mouth daily.   Yes [provider]  levothyroxine (SYNTHROID) 100 MCG tablet TAKE 1 TABLET(100 MCG) BY MOUTH DAILY 11/24/20  Yes Renato Shin, MD  METRONIDAZOLE, TOPICAL, 0.75 % LOTN APP TO FACE AT NIGHT UTD 05/30/19  Yes [provider]  Red Yeast Rice Extract (RED YEAST RICE PO) Take by mouth.   Yes [provider]  Bioflavonoid Products (ESTER-C) 950-932-67 MG TABS Take by mouth. Patient not taking: No sig reported    [provider]   Social History   Socioeconomic History  . Marital status: Married    Spouse name: Not on file  . Number of children: Not on file  . Years of education: Not on file  . Highest education level: Not on file  Occupational History  . Not on file  Tobacco Use  . Smoking status: Never Smoker  . Smokeless tobacco: Never Used  Substance and Sexual Activity  . Alcohol use: Yes    Alcohol/week: 4.0 standard drinks  Types: 4 Standard drinks or equivalent per week  . Drug use: No  . Sexual activity: Yes  Other Topics Concern  . Not on file  Social History Narrative   Married (marriage is a stressor). 1 daughter Cloyde Reams at [employer] age 67 in 2016.       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       Hobbies: tennis, walking, time with dogs, pickleball. Reading. Outdoors.    Social Determinants of Health   Financial Resource Strain: Not on file  Food Insecurity: Not on file  Transportation Needs: Not on file  Physical Activity: Not on file  Stress: Not on file   Social Connections: Not on file  Intimate Partner Violence: Not on file    Observations/Objective: There were no vitals filed for this visit. Speaking in full sentences, no distress, no respiratory distress, coherent responses.  Understanding expressed of plan and all questions were answered  Assessment and Plan: Upper respiratory tract infection, unspecified type - Plan: azithromycin (ZITHROMAX) 250 MG tablet Possible initial viral syndrome now with worsening symptoms, discolored mucus.  Possible secondary bacterial sinus/bronchitis versus persistent viral syndrome.  Did recommend repeat COVID test although initial negative test at 3 to 4 days after start of symptoms.  Masking/isolation precautions until negative repeat covid testing discussed.  Symptomatic care with fluids, saline nasal spray.  Z-Pak prescribed with potential side effects and risks given.  RTC/ER precautions  Follow Up Instructions:    I discussed the assessment and treatment plan with the patient. The patient was provided an opportunity to ask questions and all were answered. The patient agreed with the plan and demonstrated an understanding of the instructions.   The patient was advised to call back or seek an in-person evaluation if the symptoms worsen or if the condition fails to improve as anticipated.  I provided 17 minutes of non-face-to-face time during this encounter.   Wendie Agreste, MD

## 2021-04-08 ENCOUNTER — Encounter: Payer: Self-pay | Admitting: *Deleted

## 2021-04-28 ENCOUNTER — Other Ambulatory Visit: Payer: Self-pay | Admitting: Endocrinology

## 2021-04-28 ENCOUNTER — Other Ambulatory Visit: Payer: Self-pay | Admitting: Internal Medicine

## 2021-06-11 ENCOUNTER — Encounter: Payer: Self-pay | Admitting: Internal Medicine

## 2021-06-11 ENCOUNTER — Ambulatory Visit (INDEPENDENT_AMBULATORY_CARE_PROVIDER_SITE_OTHER): Payer: Medicare Other | Admitting: Internal Medicine

## 2021-06-11 ENCOUNTER — Other Ambulatory Visit: Payer: Self-pay

## 2021-06-11 VITALS — BP 138/78 | HR 68 | Ht 65.0 in | Wt 157.0 lb

## 2021-06-11 DIAGNOSIS — E039 Hypothyroidism, unspecified: Secondary | ICD-10-CM

## 2021-06-11 DIAGNOSIS — E559 Vitamin D deficiency, unspecified: Secondary | ICD-10-CM | POA: Diagnosis not present

## 2021-06-11 LAB — T4, FREE: Free T4: 1.03 ng/dL (ref 0.60–1.60)

## 2021-06-11 LAB — TSH: TSH: 2.21 u[IU]/mL (ref 0.35–5.50)

## 2021-06-11 MED ORDER — LEVOTHYROXINE SODIUM 100 MCG PO TABS: ORAL_TABLET | ORAL | 3 refills | Status: DC

## 2021-06-11 NOTE — Patient Instructions (Signed)
Please continue Levothyroxine 100 mcg daily.  Take the thyroid hormone every day, with water, at least 30 minutes before breakfast, separated by at least 4 hours from: - acid reflux medications - calcium - iron - multivitamins  Please continue vitamin D daily.  Please return in 1 year. 

## 2021-06-11 NOTE — Progress Notes (Signed)
Patient ID: Julie Pugh, female   DOB: [birth year], 65 y.o.   MRN: [id number]  This visit occurred during the SARS-CoV-2 public health emergency.  Safety protocols were in place, including screening questions prior to the visit, additional usage of staff PPE, and extensive cleaning of exam room while observing appropriate contact time as indicated for disinfecting solutions.   HPI  Julie Pugh is a 65 y.o. female, returning for f/u for hypothyroidism and vitamin D insufficiency. Last visit 1 year ago.  Interim history: She is feeling well, without complaints at today's visit.  Reviewed history: Pt. has been dx with hypothyroidism in 2012 (foggy mind, weight gain, fatigue); is on Armour 60 mg (equivalent of Levothyroxine 100 mcg), but was off 2016 Summer >> TSH in the Fall of 2016: 3.33 >> repeat TSH 1.83.  She was then restarted on Armour with maintenance of normal TFTs (?).  She changed to LT4 in 11/2019 per insurance preference.  She did not feel a difference after this change.  Pt is on Levothyroxine 100 daily, taken: - in am (5-6 AM) - fasting - at least 30 min from b'fast - no Ca, Fe, PPIs, + occasional multivitamins at night (now also during the summer) - not on Biotin  Reviewed patient TFTs: Lab Results  Component Value Date   TSH 2.03 10/08/2020   TSH 1.83 03/06/2020   TSH 3.53 08/31/2019   TSH 1.46 06/04/2019   TSH 1.58 05/10/2018   TSH 2.63 09/28/2017   TSH 0.97 02/21/2017   TSH 1.31 05/25/2016   TSH 1.31 11/26/2015   TSH 1.83 09/29/2015   TSH 3.33 07/03/2015   TSH 2.9 06/28/2014   Lab Results  Component Value Date   FREET4 1.21 03/06/2020   FREET4 0.91 06/04/2019   FREET4 0.75 05/10/2018   FREET4 0.74 02/21/2017   FREET4 0.83 05/25/2016   FREET4 0.82 11/26/2015   Lab Results  Component Value Date   T3FREE 3.4 06/04/2019   T3FREE 3.8 05/10/2018   T3FREE 4.2 02/21/2017   T3FREE 3.8 05/25/2016   T3FREE 3.4 11/26/2015   T3FREE 2.7 06/28/2014   Pt  denies: - feeling nodules in neck - hoarseness - dysphagia - choking - SOB with lying down  No FH of thyroid ds. or thyroid cancer. No h/o radiation tx to head or neck.  No herbal supplements. No Biotin use. No recent steroids use.   Vitamin D def:  Reviewed vitamin D levels: Lab Results  Component Value Date   VD25OH 53.58 10/08/2020   VD25OH 52.3 03/06/2020   VD25OH 32.51 08/31/2019   VD25OH 27.37 (L) 06/04/2019   VD25OH 35.51 09/28/2017   VD25OH 51.77 02/21/2017   VD25OH 24.65 (L) 05/25/2016   VD25OH 29.33 (L) 11/26/2015   On 5000 units vitamin D daily + MVI.  She stopped HRT 02/2015.  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  She refused to restart a statin.  In October 2019 she went to [place] with her sister, as part of an organized tour.  ROS: Constitutional: no weight gain/no weight loss, no fatigue, no subjective hyperthermia, no subjective hypothermia Eyes: no blurry vision, no xerophthalmia ENT: no sore throat, + see HPI Cardiovascular: no CP/no SOB/no palpitations/no leg swelling Respiratory: no cough/no SOB/no wheezing Gastrointestinal: no N/no V/no D/no C/no acid reflux Musculoskeletal: no muscle aches/no joint aches Skin: no rashes, no hair loss Neurological: no tremors/no numbness/no tingling/no dizziness  I reviewed pt's medications, allergies, PMH, social hx, family hx, and changes were documented  in the history of present illness. Otherwise, unchanged from my initial visit note.  Past Medical History:  Diagnosis Date   Hx of melanoma of skin    Hyperlipidemia    Hypothyroidism    Dr. Jaynie Collins managing. armour thyroid '60mg'$     Melanoma ([place])    R shin 2009. sees derm q6 months   Varicose vein    Past Surgical History:  Procedure Laterality Date   BREAST BIOPSY     benign 2000-stereotactic biopsy   BREAST BIOPSY Right 03/12/2004   VARICOSE VEIN SURGERY     2001   Social History   Socioeconomic History   Marital status:  Married    Spouse name: Not on file   Number of children: Not on file   Years of education: Not on file   Highest education level: Not on file  Occupational History   Not on file  Tobacco Use   Smoking status: Never   Smokeless tobacco: Never  Substance and Sexual Activity   Alcohol use: Yes    Alcohol/week: 4.0 standard drinks    Types: 4 Standard drinks or equivalent per week   Drug use: No   Sexual activity: Yes  Other Topics Concern   Not on file  Social History Narrative   Married (marriage is a stressor). 1 daughter Julie Pugh at [employer] age 69 in 2016.       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       Hobbies: tennis, walking, time with dogs, pickleball. Reading. Outdoors.    Social Determinants of Health   Financial Resource Strain: Not on file  Food Insecurity: Not on file  Transportation Needs: Not on file  Physical Activity: Not on file  Stress: Not on file  Social Connections: Not on file  Intimate Partner Violence: Not on file   Current Outpatient Medications on File Prior to Visit  Medication Sig Dispense Refill   azithromycin (ZITHROMAX) 250 MG tablet Take 2 pills by mouth on day 1, then 1 pill by mouth per day on days 2 through 5. 6 tablet 0   Bioflavonoid Products (ESTER-C) 500-200-60 MG TABS Take by mouth. (Patient not taking: No sig reported)     Cholecalciferol (VITAMIN D) 2000 units tablet Take 5,000 Units by mouth daily.     levothyroxine (SYNTHROID) 100 MCG tablet TAKE 1 TABLET(100 MCG) BY MOUTH DAILY 45 tablet 2   METRONIDAZOLE, TOPICAL, 0.75 % LOTN APP TO FACE AT NIGHT UTD     Red Yeast Rice Extract (RED YEAST RICE PO) Take by mouth.     No current facility-administered medications on file prior to visit.   No Known Allergies Family History  Problem Relation Age of Onset   Heart attack Father        bypass 56, 49 fatal MI, former smoker   Panic disorder Mother    COPD Mother        smoked until 27    Congestive Heart Failure Mother        pacemaker   Hypertension Mother    Hyperlipidemia Sister    PE: BP 138/78 (BP Location: Right Arm, Patient Position: Sitting, Cuff Size: Normal)   Pulse 68   Ht '5\' 5"'$  (1.651 m)   Wt 157 lb (71.2 kg)   SpO2 97%   BMI 26.13 kg/m  Wt Readings from Last 3 Encounters:  06/11/21 157 lb (71.2 kg)  09/25/20 154 lb 9.6 oz (70.1 kg)  06/12/20  157 lb 6 oz (71.4 kg)   Constitutional: normal weight, in NAD Eyes: PERRLA, EOMI, no exophthalmos ENT: moist mucous membranes, no thyromegaly, no cervical lymphadenopathy Cardiovascular: RRR, No MRG Respiratory: CTA B Gastrointestinal: abdomen soft, NT, ND, BS+ Musculoskeletal: no deformities, strength intact in all 4 Skin: moist, warm, no rashes Neurological: no tremor with outstretched hands, DTR normal in all 4  ASSESSMENT: 1. Hypothyroidism  2. Vitamin D insufficiency  PLAN:  1. Patient with history of hypothyroidism, on desiccated thyroid extract.  She has an unusual history of being off thyroid hormones for the whole summer 2016, however, her TSH checked in 06/2015 returns normal, at 3.33.  She was then started on the full dose of Armour 60 mg daily (equivalent 100 mcg of levothyroxine) and her TFTs returned normal afterwards, however, in 11/2018 we had to switch back to levothyroxine per insurance preference.  She did not feel different after changing the formulation. - latest thyroid labs reviewed with pt. >> normal: Lab Results  Component Value Date   TSH 2.03 10/08/2020  - she continues on LT4 100 mcg daily - pt feels good on this dose. - we discussed about taking the thyroid hormone every day, with water, >30 minutes before breakfast, separated by >4 hours from acid reflux medications, calcium, iron, multivitamins. Pt. is taking it correctly. - will check thyroid tests today: TSH and fT4 - If labs are abnormal, she will need to return for repeat TFTs in 1.5 months  2. Vit D  insufficiency -She is currently on vitamin D3 5000 units daily -On this dose, her vitamin D level from 02/2020 was normal -We will repeat a level now  Component     Latest Ref Rng & Units 06/11/2021  TSH     0.35 - 5.50 uIU/mL 2.21  T4,Free(Direct)     0.60 - 1.60 ng/dL 1.03  Vitamin D, 25-Hydroxy     30.0 - 100.0 ng/mL 53.6  Labs are all at goal.  Philemon Kingdom, MD PhD [facility] Endocrinology

## 2021-06-12 ENCOUNTER — Encounter: Payer: Self-pay | Admitting: Internal Medicine

## 2021-06-12 LAB — VITAMIN D 25 HYDROXY (VIT D DEFICIENCY, FRACTURES): Vit D, 25-Hydroxy: 53.6 ng/mL (ref 30.0–100.0)

## 2021-06-16 ENCOUNTER — Ambulatory Visit (INDEPENDENT_AMBULATORY_CARE_PROVIDER_SITE_OTHER): Payer: Medicare Other | Admitting: Family Medicine

## 2021-06-16 ENCOUNTER — Encounter: Payer: Self-pay | Admitting: Family Medicine

## 2021-06-16 ENCOUNTER — Other Ambulatory Visit: Payer: Self-pay

## 2021-06-16 ENCOUNTER — Ambulatory Visit: Payer: Medicare Other | Admitting: Family

## 2021-06-16 VITALS — BP 132/76 | HR 83 | Temp 98.6°F | Wt 156.8 lb

## 2021-06-16 DIAGNOSIS — S60219A Contusion of unspecified wrist, initial encounter: Secondary | ICD-10-CM | POA: Diagnosis not present

## 2021-06-16 NOTE — Progress Notes (Signed)
   Subjective:    Patient ID: Julie Pugh, female    DOB: 1956/01/06, 65 y.o.   MRN: [id number]  HPI Here for bruising in the right hand. On 06-10-21 she had blood drawn from a vein on the dorsal right hand in Dr. Arman Filter office, which she has had done many times over the years. However this time the stick was more difficult than usual. She then noticed some typical bruising over the dorsal hand the next 24 hours which did not alarm here. Then over the past 24 hours she had areas of redness spread to the ulnar side of the right hand and forearm. Now the spreading seems to have stopped. There is a slight discomfort in the hand. She does not blood thinners or NSAID's.    Review of Systems  Constitutional: Negative.   Respiratory: Negative.    Cardiovascular: Negative.   Skin:  Positive for color change.      Objective:   Physical Exam Constitutional:      Appearance: Normal appearance. She is not ill-appearing.  Cardiovascular:     Rate and Rhythm: Normal rate and regular rhythm.     Pulses: Normal pulses.     Heart sounds: Normal heart sounds.  Pulmonary:     Effort: Pulmonary effort is normal.     Breath sounds: Normal breath sounds.  Skin:    Comments: There is ecchymosis on the dorsal right hand, and there are areas of dark erythema on the ulnar hand, wrist, and forearm. These are not warm and not tender   Neurological:     Mental Status: She is alert.          Assessment & Plan:  She has a large area of bruising from the needle stick on the hand. There is no sign of infection. The spread of blood seems to have peaked, and now this should slowly fade away over the next 3-4 weeks. She will follow up as needed.  Alysia Penna, MD

## 2021-06-29 DIAGNOSIS — H2513 Age-related nuclear cataract, bilateral: Secondary | ICD-10-CM | POA: Diagnosis not present

## 2021-07-13 DIAGNOSIS — D2272 Melanocytic nevi of left lower limb, including hip: Secondary | ICD-10-CM | POA: Diagnosis not present

## 2021-07-13 DIAGNOSIS — L814 Other melanin hyperpigmentation: Secondary | ICD-10-CM | POA: Diagnosis not present

## 2021-07-13 DIAGNOSIS — D225 Melanocytic nevi of trunk: Secondary | ICD-10-CM | POA: Diagnosis not present

## 2021-07-13 DIAGNOSIS — L718 Other rosacea: Secondary | ICD-10-CM | POA: Diagnosis not present

## 2021-07-13 DIAGNOSIS — L218 Other seborrheic dermatitis: Secondary | ICD-10-CM | POA: Diagnosis not present

## 2021-07-13 DIAGNOSIS — D692 Other nonthrombocytopenic purpura: Secondary | ICD-10-CM | POA: Diagnosis not present

## 2021-07-13 DIAGNOSIS — D485 Neoplasm of uncertain behavior of skin: Secondary | ICD-10-CM | POA: Diagnosis not present

## 2021-07-13 DIAGNOSIS — D2271 Melanocytic nevi of right lower limb, including hip: Secondary | ICD-10-CM | POA: Diagnosis not present

## 2021-07-13 DIAGNOSIS — D2262 Melanocytic nevi of left upper limb, including shoulder: Secondary | ICD-10-CM | POA: Diagnosis not present

## 2021-07-13 DIAGNOSIS — L821 Other seborrheic keratosis: Secondary | ICD-10-CM | POA: Diagnosis not present

## 2021-10-16 ENCOUNTER — Encounter: Payer: Medicare Other | Admitting: Family Medicine

## 2021-10-22 ENCOUNTER — Encounter: Payer: Self-pay | Admitting: Family Medicine

## 2021-10-22 ENCOUNTER — Ambulatory Visit (INDEPENDENT_AMBULATORY_CARE_PROVIDER_SITE_OTHER): Payer: Medicare Other | Admitting: Family Medicine

## 2021-10-22 VITALS — BP 134/74 | HR 70 | Temp 98.0°F | Resp 16 | Ht 65.5 in | Wt 157.8 lb

## 2021-10-22 DIAGNOSIS — E559 Vitamin D deficiency, unspecified: Secondary | ICD-10-CM

## 2021-10-22 DIAGNOSIS — E785 Hyperlipidemia, unspecified: Secondary | ICD-10-CM | POA: Diagnosis not present

## 2021-10-22 DIAGNOSIS — Z0000 Encounter for general adult medical examination without abnormal findings: Secondary | ICD-10-CM

## 2021-10-22 LAB — LIPID PANEL
Cholesterol: 234 mg/dL — ABNORMAL HIGH (ref 0–200)
HDL: 56.8 mg/dL (ref >39.00)
LDL Cholesterol: 149 mg/dL — ABNORMAL HIGH (ref 0–99)
NonHDL: 177.18
Total CHOL/HDL Ratio: 4
Triglycerides: 143 mg/dL (ref 0.0–149.0)
VLDL: 28.6 mg/dL (ref 0.0–40.0)

## 2021-10-22 LAB — HEPATIC FUNCTION PANEL
ALT: 17 U/L (ref 0–35)
AST: 20 U/L (ref 0–37)
Albumin: 4.2 g/dL (ref 3.5–5.2)
Alkaline Phosphatase: 60 U/L (ref 39–117)
Bilirubin, Direct: 0.1 mg/dL (ref 0.0–0.3)
Total Bilirubin: 0.5 mg/dL (ref 0.2–1.2)
Total Protein: 7.2 g/dL (ref 6.0–8.3)

## 2021-10-22 LAB — BASIC METABOLIC PANEL
BUN: 20 mg/dL (ref 6–23)
CO2: 29 mEq/L (ref 19–32)
Calcium: 9.4 mg/dL (ref 8.4–10.5)
Chloride: 105 mEq/L (ref 96–112)
Creatinine, Ser: 0.95 mg/dL (ref 0.40–1.20)
GFR: 61.89 mL/min (ref >60.00)
Glucose, Bld: 87 mg/dL (ref 70–99)
Potassium: 4.2 mEq/L (ref 3.5–5.1)
Sodium: 140 mEq/L (ref 135–145)

## 2021-10-22 LAB — VITAMIN D 25 HYDROXY (VIT D DEFICIENCY, FRACTURES): VITD: 50.39 ng/mL (ref 30.00–100.00)

## 2021-10-22 LAB — CBC WITH DIFFERENTIAL/PLATELET
Basophils Absolute: 0 10*3/uL (ref 0.0–0.1)
Basophils Relative: 0.6 % (ref 0.0–3.0)
Eosinophils Absolute: 0.2 10*3/uL (ref 0.0–0.7)
Eosinophils Relative: 3 % (ref 0.0–5.0)
HCT: 44.1 % (ref 36.0–46.0)
Hemoglobin: 14.5 g/dL (ref 12.0–15.0)
Lymphocytes Relative: 35 % (ref 12.0–46.0)
Lymphs Abs: 2 10*3/uL (ref 0.7–4.0)
MCHC: 32.8 g/dL (ref 30.0–36.0)
MCV: 90 fl (ref 78.0–100.0)
Monocytes Absolute: 0.5 10*3/uL (ref 0.1–1.0)
Monocytes Relative: 9 % (ref 3.0–12.0)
Neutro Abs: 3.1 10*3/uL (ref 1.4–7.7)
Neutrophils Relative %: 52.4 % (ref 43.0–77.0)
Platelets: 297 10*3/uL (ref 150.0–400.0)
RBC: 4.9 Mil/uL (ref 3.87–5.11)
RDW: 12.6 % (ref 11.5–15.5)
WBC: 5.9 10*3/uL (ref 4.0–10.5)

## 2021-10-22 LAB — TSH: TSH: 1.88 u[IU]/mL (ref 0.35–5.50)

## 2021-10-22 NOTE — Assessment & Plan Note (Signed)
Check labs and replete prn. 

## 2021-10-22 NOTE — Assessment & Plan Note (Signed)
Chronic problem.  Check labs.  Adjust meds prn  

## 2021-10-22 NOTE — Assessment & Plan Note (Signed)
Pt's PE WNL.  UTD on mammo, DEXA, colonoscopy.  Declines vaccines.  Check labs.  Anticipatory guidance provided.

## 2021-10-22 NOTE — Progress Notes (Signed)
° °  Subjective:    Patient ID: Julie Pugh, female    DOB: Apr 17, 1956, 66 y.o.   MRN: [id number]  HPI CPE- UTD on mammo, colonoscopy, DEXA.  Declines Tdap, flu, PNA  Patient Care Team    Relationship Specialty Notifications Start End  [name], MD PCP - General Family Medicine  01/30/16   Philemon Kingdom, MD Consulting Physician Internal Medicine  04/02/16     Health Maintenance  Topic Date Due   COVID-19 Vaccine (1) 11/07/2021 (Originally 07/23/1956)   Zoster Vaccines- Shingrix (1 of 2) 01/20/2022 (Originally 01/22/2004)   TETANUS/TDAP  02/02/2022 (Originally 01/21/1973)   Pneumonia Vaccine 52+ Years old (1 - PCV) 10/22/2022 (Originally 01/22/2019)   INFLUENZA VACCINE  07/10/2035 (Originally 05/11/2021)   Hepatitis C Screening  10/07/2098 (Originally 01/22/1972)   MAMMOGRAM  11/05/2022   COLONOSCOPY (Pts 45-52yrs Insurance coverage will need to be confirmed)  07/18/2029   DEXA SCAN  Completed   HPV VACCINES  Aged Out      Review of Systems Patient reports no vision/ hearing changes, adenopathy,fever, weight change,  persistant/recurrent hoarseness , swallowing issues, chest pain, palpitations, edema, persistant/recurrent cough, hemoptysis, dyspnea (rest/exertional/paroxysmal nocturnal), gastrointestinal bleeding (melena, rectal bleeding), abdominal pain, significant heartburn, bowel changes, GU symptoms (dysuria, hematuria, incontinence), Gyn symptoms (abnormal  bleeding, pain),  syncope, focal weakness, memory loss, numbness & tingling, skin/hair/nail changes, abnormal bruising or bleeding, anxiety, or depression.   This visit occurred during the SARS-CoV-2 public health emergency.  Safety protocols were in place, including screening questions prior to the visit, additional usage of staff PPE, and extensive cleaning of exam room while observing appropriate contact time as indicated for disinfecting solutions.      Objective:   Physical Exam General Appearance:    Alert,  cooperative, no distress, appears stated age  Head:    Normocephalic, without obvious abnormality, atraumatic  Eyes:    PERRL, conjunctiva/corneas clear, EOM's intact, fundi    benign, both eyes  Ears:    Normal TM's and external ear canals, both ears  Nose:   Deferred due to COVID  Throat:   Neck:   Supple, symmetrical, trachea midline, no adenopathy;    Thyroid: no enlargement/tenderness/nodules  Back:     Symmetric, no curvature, ROM normal, no CVA tenderness  Lungs:     Clear to auscultation bilaterally, respirations unlabored  Chest Wall:    No tenderness or deformity   Heart:    Regular rate and rhythm, S1 and S2 normal, no murmur, rub   or gallop  Breast Exam:    Deferred to mammo  Abdomen:     Soft, non-tender, bowel sounds active all four quadrants,    no masses, no organomegaly  Genitalia:    Deferred  Rectal:    Extremities:   Extremities normal, atraumatic, no cyanosis or edema  Pulses:   2+ and symmetric all extremities  Skin:   Skin color, texture, turgor normal, no rashes or lesions  Lymph nodes:   Cervical, supraclavicular, and axillary nodes normal  Neurologic:   CNII-XII intact, normal strength, sensation and reflexes    throughout          Assessment & Plan:

## 2021-10-22 NOTE — Patient Instructions (Addendum)
Follow up in 1 year or as needed We'll notify you of your lab results and make any changes if needed Continue to work on healthy diet and regular exercise- you're doing great! Call with any questions or concerns Stay Safe!  Stay Healthy! Happy New Year!! 

## 2021-10-23 ENCOUNTER — Encounter: Payer: Self-pay | Admitting: Family Medicine

## 2021-11-17 ENCOUNTER — Other Ambulatory Visit: Payer: Self-pay

## 2021-11-17 DIAGNOSIS — E039 Hypothyroidism, unspecified: Secondary | ICD-10-CM

## 2021-11-17 MED ORDER — LEVOTHYROXINE SODIUM 100 MCG PO TABS: ORAL_TABLET | ORAL | 3 refills | Status: DC

## 2021-12-09 ENCOUNTER — Other Ambulatory Visit: Payer: Self-pay | Admitting: Family Medicine

## 2021-12-09 DIAGNOSIS — Z1231 Encounter for screening mammogram for malignant neoplasm of breast: Secondary | ICD-10-CM

## 2021-12-15 ENCOUNTER — Telehealth: Payer: Self-pay | Admitting: Family Medicine

## 2021-12-15 NOTE — Telephone Encounter (Signed)
Left message for patient to call back and schedule Medicare Annual Wellness Visit (AWV) in office.  ? ?If not able to come in office, please offer to do virtually or by telephone.  Left office number and my jabber [phone]. ? ?Last AWV: 08/30/20 ? ?Please schedule at anytime with Nurse Health Advisor. ?  ?

## 2021-12-29 ENCOUNTER — Encounter: Payer: Self-pay | Admitting: Family Medicine

## 2022-01-05 ENCOUNTER — Other Ambulatory Visit: Payer: Self-pay | Admitting: Family Medicine

## 2022-01-05 DIAGNOSIS — Z1231 Encounter for screening mammogram for malignant neoplasm of breast: Secondary | ICD-10-CM

## 2022-01-07 ENCOUNTER — Ambulatory Visit
Admission: RE | Admit: 2022-01-07 | Discharge: 2022-01-07 | Disposition: A | Discharge status: Home / Self Care | Payer: Medicare Other | Source: Ambulatory Visit | Attending: Family Medicine | Admitting: Family Medicine

## 2022-01-07 DIAGNOSIS — Z1231 Encounter for screening mammogram for malignant neoplasm of breast: Secondary | ICD-10-CM

## 2022-01-08 ENCOUNTER — Encounter: Payer: Self-pay | Admitting: Family Medicine

## 2022-01-11 ENCOUNTER — Other Ambulatory Visit: Payer: Self-pay | Admitting: Family Medicine

## 2022-01-11 DIAGNOSIS — R928 Other abnormal and inconclusive findings on diagnostic imaging of breast: Secondary | ICD-10-CM

## 2022-01-28 ENCOUNTER — Ambulatory Visit
Admission: RE | Admit: 2022-01-28 | Discharge: 2022-01-28 | Disposition: A | Discharge status: Home / Self Care | Payer: Medicare Other | Source: Ambulatory Visit | Attending: Family Medicine | Admitting: Family Medicine

## 2022-01-28 ENCOUNTER — Other Ambulatory Visit: Payer: Self-pay | Admitting: Family Medicine

## 2022-01-28 DIAGNOSIS — R928 Other abnormal and inconclusive findings on diagnostic imaging of breast: Secondary | ICD-10-CM

## 2022-01-28 DIAGNOSIS — N6489 Other specified disorders of breast: Secondary | ICD-10-CM | POA: Diagnosis not present

## 2022-01-28 DIAGNOSIS — N6320 Unspecified lump in the left breast, unspecified quadrant: Secondary | ICD-10-CM

## 2022-02-08 ENCOUNTER — Ambulatory Visit
Admission: RE | Admit: 2022-02-08 | Discharge: 2022-02-08 | Disposition: A | Discharge status: Home / Self Care | Payer: Medicare Other | Source: Ambulatory Visit | Attending: Family Medicine | Admitting: Family Medicine

## 2022-02-08 ENCOUNTER — Encounter: Payer: Self-pay | Admitting: Family Medicine

## 2022-02-08 DIAGNOSIS — Z8249 Family history of ischemic heart disease and other diseases of the circulatory system: Secondary | ICD-10-CM

## 2022-02-08 DIAGNOSIS — E785 Hyperlipidemia, unspecified: Secondary | ICD-10-CM

## 2022-02-08 DIAGNOSIS — Z170 Estrogen receptor positive status [ER+]: Secondary | ICD-10-CM | POA: Diagnosis not present

## 2022-02-08 DIAGNOSIS — N6320 Unspecified lump in the left breast, unspecified quadrant: Secondary | ICD-10-CM

## 2022-02-08 DIAGNOSIS — C50412 Malignant neoplasm of upper-outer quadrant of left female breast: Secondary | ICD-10-CM | POA: Diagnosis not present

## 2022-02-12 ENCOUNTER — Telehealth: Payer: Self-pay | Admitting: *Deleted

## 2022-02-12 ENCOUNTER — Other Ambulatory Visit: Payer: Self-pay | Admitting: General Surgery

## 2022-02-12 ENCOUNTER — Encounter: Payer: Self-pay | Admitting: *Deleted

## 2022-02-12 ENCOUNTER — Telehealth: Payer: Self-pay | Admitting: Hematology and Oncology

## 2022-02-12 DIAGNOSIS — Z170 Estrogen receptor positive status [ER+]: Secondary | ICD-10-CM | POA: Diagnosis not present

## 2022-02-12 DIAGNOSIS — C50412 Malignant neoplasm of upper-outer quadrant of left female breast: Secondary | ICD-10-CM

## 2022-02-12 NOTE — Telephone Encounter (Signed)
Spoke to pt, provided navigation resources and contact information. Discussed future appts with med/rad onc as well as PT for pre-op measurements and sozo. Denies further questions or needs at this time. ?

## 2022-02-12 NOTE — Telephone Encounter (Signed)
Scheduled appt per 5/5 referral. Pt is aware of appt date and time. Pt is aware to arrive 15 mins prior to appt time and to bring and updated insurance card. Pt is aware of appt location.   ?

## 2022-02-15 ENCOUNTER — Encounter: Payer: Self-pay | Admitting: Physical Therapy

## 2022-02-15 ENCOUNTER — Other Ambulatory Visit: Payer: Self-pay | Admitting: General Surgery

## 2022-02-15 ENCOUNTER — Ambulatory Visit: Payer: Medicare Other | Attending: General Surgery | Admitting: Physical Therapy

## 2022-02-15 DIAGNOSIS — R293 Abnormal posture: Secondary | ICD-10-CM | POA: Diagnosis not present

## 2022-02-15 DIAGNOSIS — C50412 Malignant neoplasm of upper-outer quadrant of left female breast: Secondary | ICD-10-CM | POA: Diagnosis not present

## 2022-02-15 DIAGNOSIS — Z170 Estrogen receptor positive status [ER+]: Secondary | ICD-10-CM | POA: Diagnosis not present

## 2022-02-15 NOTE — Therapy (Signed)
OUTPATIENT PHYSICAL THERAPY BREAST CANCER BASELINE EVALUATION   Patient Name: Julie Pugh MRN: [id number] DOB:January 13, 1956, 66 y.o., female Today's Date: 02/15/2022   PT End of Session - 02/15/22 1654     Visit Number 1    Number of Visits 2    Date for PT Re-Evaluation 04/12/22    PT Start Time 1613   pt arrived late   PT Stop Time 1653    PT Time Calculation (min) 40 min    Activity Tolerance Patient tolerated treatment well    Behavior During Therapy WFL for tasks assessed/performed             Past Medical History:  Diagnosis Date   Hx of melanoma of skin    Hyperlipidemia    Hypothyroidism    Dr. Asencion Islam managing. armour thyroid 60mg     Melanoma (HCC)    R shin 2009. sees derm q6 months   Varicose vein    Past Surgical History:  Procedure Laterality Date   BREAST BIOPSY     benign 2000-stereotactic biopsy   BREAST BIOPSY Right 03/12/2004   VARICOSE VEIN SURGERY     2001   Patient Active Problem List   Diagnosis Date Noted   Malignant neoplasm of upper-outer quadrant of left breast in female, estrogen receptor positive (HCC) 02/12/2022   History of COVID-19 06/12/2020   Visit for preventive health examination 09/28/2017   Vitamin D deficiency 05/26/2017   Hyperlipidemia 07/10/2015   Hypothyroidism    History of melanoma    Varicose vein     PCP: Neena Rhymes, MD  REFERRING PROVIDER: Emelia Loron, MD  REFERRING DIAG: C50.412,Z17.0 (ICD-10-CM) - Malignant neoplasm of upper-outer quadrant of left breast in female, estrogen receptor positive (HCC)  THERAPY DIAG:  Abnormal posture  Malignant neoplasm of upper-outer quadrant of left breast in female, estrogen receptor positive (HCC)  ONSET DATE: 02/08/22  SUBJECTIVE                                                                                                                                                                                           SUBJECTIVE STATEMENT: Patient reports she is  here today to be seen by her medical team for her newly diagnosed left breast cancer.   PERTINENT HISTORY:  Patient was diagnosed on 02/08/22 with left grade 2. It measures less than 1 cm and is located in the upper outer quadrant. It is ER/PR+ (HER2 equivocal - waiting on results) with a Ki67 of 30%.   PATIENT GOALS   reduce lymphedema risk and learn post op HEP.   PAIN:  Are you having pain? No  PRECAUTIONS: Active CA   HAND DOMINANCE: right  WEIGHT BEARING RESTRICTIONS No  FALLS:  Has patient fallen in last 6 months? No  LIVING ENVIRONMENT: Patient lives with: husband, Reita Cliche Lives in: House/apartment Has following equipment at home: None  OCCUPATION: part time, works for [employer]: pickle ball for 3 hours, walking, tennis - exercises daily  PRIOR LEVEL OF FUNCTION: Independent   OBJECTIVE  COGNITION:  Overall cognitive status: Within functional limits for tasks assessed    POSTURE:  Forward head and rounded shoulders posture  UPPER EXTREMITY AROM/PROM:  A/PROM RIGHT  02/15/2022   Shoulder extension 80  Shoulder flexion 150  Shoulder abduction 161  Shoulder internal rotation 59  Shoulder external rotation 82    (Blank rows = not tested)  A/PROM LEFT  02/15/2022  Shoulder extension 67  Shoulder flexion 162  Shoulder abduction 167  Shoulder internal rotation 58  Shoulder external rotation 66    (Blank rows = not tested)   CERVICAL AROM: All within normal limits:    Percent limited  Flexion WFL  Extension WFL  Right lateral flexion WFL  Left lateral flexion WFL  Right rotation WFL  Left rotation WFL     UPPER EXTREMITY STRENGTH: 5/5   LYMPHEDEMA ASSESSMENTS:   LANDMARK RIGHT  02/15/2022  10 cm proximal to olecranon process 28.4  Olecranon process 26  10 cm proximal to ulnar styloid process 21.5  Just proximal to ulnar styloid process 16.7  Across hand at thumb web space 20.5  At base of 2nd digit 6.3  (Blank rows =  not tested)  LANDMARK LEFT  02/15/2022  10 cm proximal to olecranon process 27.2  Olecranon process 25.3  10 cm proximal to ulnar styloid process 20.3  Just proximal to ulnar styloid process 15.4  Across hand at thumb web space 19.4  At base of 2nd digit 6.5  (Blank rows = not tested)   L-DEX LYMPHEDEMA SCREENING:  The patient was assessed using the L-Dex machine today to produce a lymphedema index baseline score. The patient will be reassessed on a regular basis (typically every 3 months) to obtain new L-Dex scores. If the score is > 6.5 points away from his/her baseline score indicating onset of subclinical lymphedema, it will be recommended to wear a compression garment for 4 weeks, 12 hours per day and then be reassessed. If the score continues to be > 6.5 points from baseline at reassessment, we will initiate lymphedema treatment. Assessing in this manner has a 95% rate of preventing clinically significant lymphedema.   L-DEX FLOWSHEETS - 02/15/22 1600       L-DEX LYMPHEDEMA SCREENING   Measurement Type Unilateral    L-DEX MEASUREMENT EXTREMITY Upper Extremity    POSITION  Standing    DOMINANT SIDE Right    At Risk Side Left    BASELINE SCORE (UNILATERAL) -2.7              QUICK DASH SURVEY:   Neldon Mc - 02/15/22 0001     Open a tight or new jar No difficulty    Do heavy household chores (wash walls, wash floors) No difficulty    Carry a shopping bag or briefcase No difficulty    Wash your back No difficulty    Use a knife to cut food No difficulty    Recreational activities in which you take some force or impact through your arm, shoulder, or hand (golf, hammering, tennis) No difficulty    During the  past week, to what extent has your arm, shoulder or hand problem interfered with your normal social activities with family, friends, neighbors, or groups? Not at all    During the past week, to what extent has your arm, shoulder or hand problem limited your work or other  regular daily activities Not at all    Arm, shoulder, or hand pain. None    Tingling (pins and needles) in your arm, shoulder, or hand None    Difficulty Sleeping No difficulty    DASH Score 0 %               PATIENT EDUCATION:  Education details: Lymphedema risk reduction and post op shoulder/posture HEP Person educated: Patient Education method: Explanation, Demonstration, Handout Education comprehension: Patient verbalized understanding and returned demonstration   HOME EXERCISE PROGRAM: Patient was instructed today in a home exercise program today for post op shoulder range of motion. These included active assist shoulder flexion in sitting, scapular retraction, wall walking with shoulder abduction, and hands behind head external rotation.  She was encouraged to do these twice a day, holding 3 seconds and repeating 5 times when permitted by her physician.   ASSESSMENT:  CLINICAL IMPRESSION: Pt reports to PT with recently diagnosed L breast cancer. She is planning to have L lumpectomy and SLNB. Her bilateral shoulder ROM is WFL at baseline. She will benefit from a post op PT reassessment to determine needs and from L-Dex screens every 3 months for 2 years to detect subclinical lymphedema.  Pt will benefit from skilled therapeutic intervention to improve on the following deficits: Decreased knowledge of precautions, impaired UE functional use, pain, decreased ROM, postural dysfunction.   PT treatment/interventions: ADL/self-care home management, pt/family education, therapeutic exercise  REHAB POTENTIAL: Good  CLINICAL DECISION MAKING: Stable/uncomplicated  EVALUATION COMPLEXITY: Low   GOALS: Goals reviewed with patient? YES  LONG TERM GOALS: (STG=LTG)   Name Target Date Goal status  1 Pt will be able to verbalize understanding of pertinent lymphedema risk reduction practices relevant to her dx specifically related to skin care.  Baseline:  No knowledge 02/15/2022  Achieved at eval  2 Pt will be able to return demo and/or verbalize understanding of the post op HEP related to regaining shoulder ROM. Baseline:  No knowledge 02/15/2022 Achieved at eval  3 Pt will be able to verbalize understanding of the importance of attending the post op After Breast CA Class for further lymphedema risk reduction education and therapeutic exercise.  Baseline:  No knowledge 02/15/2022 Achieved at eval  4 Pt will demo she has regained full shoulder ROM and function post operatively compared to baselines.  Baseline: See objective measurements taken today. 04/12/22 New     PLAN: PT FREQUENCY/DURATION: EVAL and 1 follow up appointment.   PLAN FOR NEXT SESSION: will reassess 3-4 weeks post op to determine needs.   Patient will follow up at outpatient cancer rehab 3-4 weeks following surgery.  If the patient requires physical therapy at that time, a specific plan will be dictated and sent to the referring physician for approval. The patient was educated today on appropriate basic range of motion exercises to begin post operatively and the importance of attending the After Breast Cancer class following surgery.  Patient was educated today on lymphedema risk reduction practices as it pertains to recommendations that will benefit the patient immediately following surgery.  She verbalized good understanding.    Physical Therapy Information for After Breast Cancer Surgery/Treatment:  Lymphedema is a swelling condition  that you may be at risk for in your arm if you have lymph nodes removed from the armpit area.  After a sentinel node biopsy, the risk is approximately 5-9% and is higher after an axillary node dissection.  There is treatment available for this condition and it is not life-threatening.  Contact your physician or physical therapist with concerns. You may begin the 4 shoulder/posture exercises (see additional sheet) when permitted by your physician (typically a week after  surgery).  If you have drains, you may need to wait until those are removed before beginning range of motion exercises.  A general recommendation is to not lift your arms above shoulder height until drains are removed.  These exercises should be done to your tolerance and gently.  This is not a "no pain/no gain" type of recovery so listen to your body and stretch into the range of motion that you can tolerate, stopping if you have pain.  If you are having immediate reconstruction, ask your plastic surgeon about doing exercises as he or she may want you to wait. We encourage you to attend the free one time ABC (After Breast Cancer) class offered by [facility].  You will learn information related to lymphedema risk, prevention and treatment and additional exercises to regain mobility following surgery.  You can call [phone] for more information.  This is offered the 1st and 3rd Monday of each month.  You only attend the class one time. While undergoing any medical procedure or treatment, try to avoid blood pressure being taken or needle sticks from occurring on the arm on the side of cancer.   This recommendation begins after surgery and continues for the rest of your life.  This may help reduce your risk of getting lymphedema (swelling in your arm). An excellent resource for those seeking information on lymphedema is the National Lymphedema Network's web site. It can be accessed at www.lymphnet.org If you notice swelling in your hand, arm or breast at any time following surgery (even if it is many years from now), please contact your doctor or physical therapist to discuss this.  Lymphedema can be treated at any time but it is easier for you if it is treated early on.  If you feel like your shoulder motion is not returning to normal in a reasonable amount of time, please contact your surgeon or physical therapist.  [facility] [phone]. [address], [place] [address]  ABC CLASS After Breast Cancer Class  After Breast Cancer Class is a specially designed exercise class to assist you in a safe recover after having breast cancer surgery.  In this class you will learn how to get back to full function whether your drains were just removed or if you had surgery a month ago.  This one-time class is held the 1st and 3rd Monday of every month from 11:00 a.m. until 12:00 noon virtually.  This class is FREE and space is limited. For more information or to register for the next available class, call [phone].  Class Goals  Understand specific stretches to improve the flexibility of you chest and shoulder. Learn ways to safely strengthen your upper body and improve your posture. Understand the warning signs of infection and why you may be at risk for an arm infection. Learn about Lymphedema and prevention.  ** You do not attend this class until after surgery.  Drains must be removed to participate  Patient  was instructed today in a home exercise program today for post op shoulder range of motion. These included active assist shoulder flexion in sitting, scapular retraction, wall walking with shoulder abduction, and hands behind head external rotation.  She was encouraged to do these twice a day, holding 3 seconds and repeating 5 times when permitted by her physician.    Milagros Loll [place], PT 02/15/2022, 4:56 PM

## 2022-02-17 ENCOUNTER — Inpatient Hospital Stay: Payer: Medicare Other | Attending: Hematology and Oncology | Admitting: Hematology and Oncology

## 2022-02-17 ENCOUNTER — Encounter: Payer: Self-pay | Admitting: *Deleted

## 2022-02-17 ENCOUNTER — Other Ambulatory Visit: Payer: Self-pay

## 2022-02-17 DIAGNOSIS — Z170 Estrogen receptor positive status [ER+]: Secondary | ICD-10-CM | POA: Diagnosis not present

## 2022-02-17 DIAGNOSIS — Z85820 Personal history of malignant melanoma of skin: Secondary | ICD-10-CM | POA: Insufficient documentation

## 2022-02-17 DIAGNOSIS — C50412 Malignant neoplasm of upper-outer quadrant of left female breast: Secondary | ICD-10-CM | POA: Diagnosis not present

## 2022-02-17 NOTE — Progress Notes (Signed)
[place] ?CONSULT NOTE ? ?Patient Care Team: ?[name], MD as PCP - General (Family Medicine) ?Philemon Kingdom, MD as Consulting Physician (Internal Medicine) ? ?CHIEF COMPLAINTS/PURPOSE OF CONSULTATION:  ?Newly diagnosed breast cancer ? ?HISTORY OF PRESENTING ILLNESS:  ?Julie Pugh 66 y.o. female is here because of recent diagnosis of left Breast cancer.  She has a screening mammogram that detected left breast mass by ultrasound measured 0.6 cm.  Biopsy of which came back as grade 2 invasive ductal carcinoma that was ER/PR positive HER2 equivocal by FISH and FISH was positive.  Proliferation rate was 30%.  She was seen by Dr. Donne Hazel who recommended lumpectomy and she is here today to discuss adjuvant treatment plan. ? ?I reviewed her records extensively and collaborated the history with the patient. ? ?SUMMARY OF ONCOLOGIC HISTORY: ?Oncology History  ?Malignant neoplasm of upper-outer quadrant of left breast in female, estrogen receptor positive ([place])  ?02/12/2022 Initial Diagnosis  ? Screening mammogram detected left breast mass, by ultrasound measured 0.6 cm, biopsy revealed grade 2 IDC ER 100%, PR 2%, HER2 equivocal by IHC and FISH positive, Ki-67 30% ?  ?02/17/2022 Cancer Staging  ? Staging form: Breast, AJCC 8th Edition ?- Clinical: Stage IA (cT1b, cN0, cM0, G2, ER+, PR-, HER2+) - Signed by Nicholas Lose, MD on 02/17/2022 ?Stage prefix: Initial diagnosis ?Histologic grading system: 3 grade system ? ?  ? ? ? ?MEDICAL HISTORY:  ?Past Medical History:  ?Diagnosis Date  ? Hx of melanoma of skin   ? Hyperlipidemia   ? Hypothyroidism   ? Dr. Jaynie Collins managing. armour thyroid $RemoveBefore'60mg'kAsbyXzIQJudt$    ? Melanoma ([place])   ? R shin 2009. sees derm q6 months  ? Varicose vein   ? ? ?SURGICAL HISTORY: ?Past Surgical History:  ?Procedure Laterality Date  ? BREAST BIOPSY    ? benign 2000-stereotactic biopsy  ? BREAST BIOPSY Right 03/12/2004  ? VARICOSE VEIN SURGERY    ? 2001  ? ? ?SOCIAL HISTORY: ?Social History   ? ?Socioeconomic History  ? Marital status: Married  ?  Spouse name: Not on file  ? Number of children: Not on file  ? Years of education: Not on file  ? Highest education level: Not on file  ?Occupational History  ? Not on file  ?Tobacco Use  ? Smoking status: Never  ? Smokeless tobacco: Never  ?Substance and Sexual Activity  ? Alcohol use: Yes  ?  Alcohol/week: 4.0 standard drinks  ?  Types: 4 Standard drinks or equivalent per week  ? Drug use: No  ? Sexual activity: Yes  ?Other Topics Concern  ? Not on file  ?Social History Narrative  ? Married (marriage is a stressor). 1 daughter Cloyde Reams at [employer] age 12 in 2016.   ?   ? 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   ?   ? Hobbies: tennis, walking, time with dogs, pickleball. Reading. Outdoors.   ? ?Social Determinants of Health  ? ?Financial Resource Strain: Not on file  ?Food Insecurity: Not on file  ?Transportation Needs: Not on file  ?Physical Activity: Not on file  ?Stress: Not on file  ?Social Connections: Not on file  ?Intimate Partner Violence: Not on file  ? ? ?FAMILY HISTORY: ?Family History  ?Problem Relation Age of Onset  ? Panic disorder Mother   ? COPD Mother   ?     smoked until 56  ? Congestive Heart Failure Mother   ?  pacemaker  ? Hypertension Mother   ? Heart attack Father   ?     bypass 60, 74 fatal MI, former smoker  ? Hyperlipidemia Sister   ? Breast cancer Neg Hx   ? ? ?ALLERGIES:  has no allergies on file. ? ?MEDICATIONS:  ?Current Outpatient Medications  ?Medication Sig Dispense Refill  ? Bioflavonoid Products (ESTER-C) [phone] MG TABS Take by mouth.    ? Cholecalciferol (VITAMIN D) 2000 units tablet Take 5,000 Units by mouth daily.    ? levothyroxine (SYNTHROID) 100 MCG tablet TAKE 1 TABLET(100 MCG) BY MOUTH DAILY 90 tablet 3  ? METRONIDAZOLE, TOPICAL, 0.75 % LOTN APP TO FACE AT NIGHT UTD    ? Multiple Vitamins-Minerals (CENTRUM SILVER 50+WOMEN PO) Take by mouth.    ? ?No current  facility-administered medications for this visit.  ? ? ?REVIEW OF SYSTEMS:   ?  ?All other systems were reviewed with the patient and are negative. ? ?PHYSICAL EXAMINATION: ?ECOG PERFORMANCE STATUS: 0 - Asymptomatic ? ?Vitals:  ? 02/17/22 1158  ?BP: (!) 142/68  ?Pulse: 61  ?Resp: 18  ?Temp: 97.9 ?F (36.6 ?C)  ?SpO2: 100%  ? ?Filed Weights  ? 02/17/22 1158  ?Weight: 155 lb 6.4 oz (70.5 kg)  ? ?  ? ?LABORATORY DATA:  ?I have reviewed the data as listed ?Lab Results  ?Component Value Date  ? WBC 5.9 10/22/2021  ? HGB 14.5 10/22/2021  ? HCT 44.1 10/22/2021  ? MCV 90.0 10/22/2021  ? PLT 297.0 10/22/2021  ? ?Lab Results  ?Component Value Date  ? NA 140 10/22/2021  ? K 4.2 10/22/2021  ? CL 105 10/22/2021  ? CO2 29 10/22/2021  ? ? ?RADIOGRAPHIC STUDIES: ?I have personally reviewed the radiological reports and agreed with the findings in the report. ? ?ASSESSMENT AND PLAN:  ?Malignant neoplasm of upper-outer quadrant of left breast in female, estrogen receptor positive ([place]) ?02/12/2022:Screening mammogram detected left breast mass, by ultrasound measured 0.6 cm, biopsy revealed grade 2 IDC ER 100%, PR 2%, HER2 equivocal by IHC and FISH positive, Ki-67 30% ? ?Pathology and radiology counseling: Discussed with the patient, the details of pathology including the type of breast cancer,the clinical staging, the significance of ER, PR and HER-2/neu receptors and the implications for treatment. After reviewing the pathology in detail, we proceeded to discuss the different treatment options between surgery, radiation, chemotherapy, antiestrogen therapies. ? ?Treatment plan: ?1.  Breast conserving surgery  ?2. adjuvant systemic chemotherapy would depend on final tumor size.  Possibly no chemo versus Taxol Herceptin ?3.  Adjuvant radiation ?4.  Followed by adjuvant antiestrogen therapy ? ?Return to clinic after surgery to discuss final pathology report ?  ?All questions were answered. The patient knows to call the clinic with any  problems, questions or concerns. ?  ? Harriette Ohara, MD ?02/17/22 ? I Gardiner Coins am scribing for Dr. Lindi Adie ? ?I have reviewed the above documentation for accuracy and completeness, and I agree with the above. ?  ?

## 2022-02-17 NOTE — Assessment & Plan Note (Signed)
02/12/2022:Screening mammogram detected left breast mass, by ultrasound measured 0.6 cm, biopsy revealed grade 2 IDC ER 100%, PR 2%, HER2 equivocal by IHC and FISH positive, Ki-67 30% ? ?Pathology and radiology counseling: Discussed with the patient, the details of pathology including the type of breast cancer,the clinical staging, the significance of ER, PR and HER-2/neu receptors and the implications for treatment. After reviewing the pathology in detail, we proceeded to discuss the different treatment options between surgery, radiation, chemotherapy, antiestrogen therapies. ? ?Treatment plan: ?1.  Breast conserving surgery ?2. adjuvant systemic chemotherapy would depend on final tumor size.  Possibly no chemo versus Taxol Herceptin ?3.  Adjuvant radiation ?4.  Followed by adjuvant antiestrogen therapy ? ?Return to clinic after surgery to discuss final pathology report ? ?

## 2022-02-18 ENCOUNTER — Telehealth: Payer: Self-pay | Admitting: Hematology and Oncology

## 2022-02-18 NOTE — Progress Notes (Signed)
? ?Radiation Oncology         [phone] ?________________________________ ? ?Initial Outpatient Consultation ? ?Name: Julie Pugh MRN: [id number]  ?Date: 02/19/2022  DOB: September 01, 1956 ? ?TT:SVXBLT, Aundra Millet, MD  Rolm Bookbinder, MD  ? ?REFERRING PHYSICIAN: Rolm Bookbinder, MD ? ?DIAGNOSIS:  ?  ICD-10-CM   ?1. Malignant neoplasm of upper-outer quadrant of left breast in female, estrogen receptor positive ([place])  C50.412   ? Z17.0   ?  ? ?Left Breast UOQ, Invasive Ductal Carcinoma, ER+ / PR+ / Her2+, Grade 2 ? ? Cancer Staging  ?Malignant neoplasm of upper-outer quadrant of left breast in female, estrogen receptor positive ([place]) ?Staging form: Breast, AJCC 8th Edition ?- Clinical: Stage IA (cT1b, cN0, cM0, G2, ER+, PR-, HER2+) - Signed by Nicholas Lose, MD on 02/17/2022 ? ?CHIEF COMPLAINT: Here to discuss management of left breast cancer ? ?HISTORY OF PRESENT ILLNESS::Julie Pugh is a 66 y.o. female who presented with a left breast abnormality on the following imaging: bilateral screening mammogram on the date of 01/07/22.  No symptoms, if any, were reported at that time. Diagnostic unilateral left breast mammogram and left breast ultrasound on 01/28/22 further revealed a suspicious mass involving the upper outer quadrant of the left breast at the 1 o'clock position middle depth, 8 cmfn, measuring approximately 0.6 x 0.5 x 0.5 cm. No pathologic left axillary lymphadenopathy was appreciated. (B-density breast also noted). ? ?Biopsy of the 1 o'clock left breast mass on date of 02/08/22 showed grade 2 invasive ductal carcinoma measuring 6 mm in the greatest linear extent of the sample (negative for LVI or necrosis).  ER status: 100% positive; PR status 2% positive (both with strong staining intensity); Proliferation marker Ki67 at 30%; Her2 status positive; Grade 2. No lymph nodes were examined.  ? ?Accordingly, the patient was referred to Dr. Donne Hazel on 02/12/22 to discuss treatment options.  Following discussion of the risks and benefits, the patient agreed to proceed with breast conserving surgery and SLN biopsies. She is scheduled for surgery on 03/16/22.  ? ?The patient was then referred to Dr. Lindi Adie on 02/17/22. During this visit, Dr. Lindi Adie discussed how the role of chemotherapy will depend on her final tumor size. Following surgery, the patient will return to Dr. Lindi Adie to discuss the role of systemic treatment and antiestrogen therapy further.  ? 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 ? ?PREVIOUS RADIATION THERAPY: No ? ?PAST MEDICAL HISTORY:  has a past medical history of melanoma of skin, Hyperlipidemia, Hypothyroidism, Melanoma ([place]), and Varicose vein.   ? ?PAST SURGICAL HISTORY: ?Past Surgical History:  ?Procedure Laterality Date  ? BREAST BIOPSY    ? benign 2000-stereotactic biopsy  ? BREAST BIOPSY Right 03/12/2004  ? VARICOSE VEIN SURGERY    ? 2001  ? ? ?FAMILY HISTORY: family history includes COPD in her mother; Congestive Heart Failure in her mother; Heart attack in her father; Hyperlipidemia in her sister; Hypertension in her mother; Panic disorder in her mother. ? ?SOCIAL HISTORY:  reports that she has never smoked. She has been exposed to tobacco smoke. She has never used smokeless tobacco. She reports current alcohol use of about 4.0 standard drinks per week. She reports that she does not use drugs. ? ?ALLERGIES: Patient has no allergy information on record. ? ?MEDICATIONS:  ?Current Outpatient Medications  ?Medication Sig Dispense Refill  ? Bioflavonoid Products (ESTER-C) [phone] MG TABS Take by mouth.    ? Cholecalciferol (VITAMIN D)  2000 units tablet Take 5,000 Units by mouth daily.    ? levothyroxine (SYNTHROID) 100 MCG tablet TAKE 1 TABLET(100 MCG) BY MOUTH DAILY 90 tablet 3  ? METRONIDAZOLE, TOPICAL, 0.75 % LOTN APP TO FACE AT NIGHT UTD    ? Multiple Vitamins-Minerals (CENTRUM SILVER 50+WOMEN PO) Take by mouth.    ? ?No current  facility-administered medications for this encounter.  ? ? ?REVIEW OF SYSTEMS: As above in HPI. ?  ?PHYSICAL EXAM:  height is 5' 5.5" (1.664 m) and weight is 154 lb 6 oz (70 kg). Her temporal temperature is 97.2 ?F (36.2 ?C) (abnormal). Her blood pressure is 143/75 (abnormal) and her pulse is 58 (abnormal). Her respiration is 18 and oxygen saturation is 100%.   ?General: Alert and oriented, in no acute distress ?HEENT: Head is normocephalic. Extraocular movements are intact.   ?Neck: Neck is supple, no palpable supraclavicular lymphadenopathy. ?Heart: Regular in rate and rhythm with no murmurs, rubs, or gallops. ?Chest: Clear to auscultation bilaterally, with no rhonchi, wheezes, or rales. ?Abdomen: Soft, nontender, nondistended, with no rigidity or guarding. ?Extremities: No cyanosis or edema. ?Lymphatics: see Neck Exam ?Skin: No concerning lesions. ?Musculoskeletal: symmetric strength and muscle tone throughout. ?Neurologic: Cranial nerves II through XII are grossly intact. No obvious focalities. Speech is fluent. Coordination is intact. ?Psychiatric: Judgment and insight are intact. Affect is appropriate. ?Breasts: There are postbiopsy changes in the left lateral breast with associated mild bruising and swelling. No other palpable masses appreciated in the breasts or axillae bilaterally. ? ? ?ECOG = 0 ? ?0 - Asymptomatic (Fully active, able to carry on all predisease activities without restriction) ? ?1 - Symptomatic but completely ambulatory (Restricted in physically strenuous activity but ambulatory and able to carry out work of a light or sedentary nature. For example, light housework, office work) ? ?2 - Symptomatic, <50% in bed during the day (Ambulatory and capable of all self care but unable to carry out any work activities. Up and about more than 50% of waking hours) ? ?3 - Symptomatic, >50% in bed, but not bedbound (Capable of only limited self-care, confined to bed or chair 50% or more of waking  hours) ? ?4 - Bedbound (Completely disabled. Cannot carry on any self-care. Totally confined to bed or chair) ? ?5 - Death ? ? Oken MM, Creech RH, Tormey DC, et al. [phone]). "Toxicity and response criteria of the [facility]". [place] Oncol. 5 (6): 649-55 ? ? ?LABORATORY DATA:  ?Lab Results  ?Component Value Date  ? WBC 5.9 10/22/2021  ? HGB 14.5 10/22/2021  ? HCT 44.1 10/22/2021  ? MCV 90.0 10/22/2021  ? PLT 297.0 10/22/2021  ? ?CMP  ?   ?Component Value Date/Time  ? NA 140 10/22/2021 1321  ? NA 139 06/28/2014 0000  ? K 4.2 10/22/2021 1321  ? CL 105 10/22/2021 1321  ? CO2 29 10/22/2021 1321  ? GLUCOSE 87 10/22/2021 1321  ? BUN 20 10/22/2021 1321  ? BUN 20 06/28/2014 0000  ? CREATININE 0.95 10/22/2021 1321  ? CALCIUM 9.4 10/22/2021 1321  ? PROT 7.2 10/22/2021 1321  ? ALBUMIN 4.2 10/22/2021 1321  ? AST 20 10/22/2021 1321  ? ALT 17 10/22/2021 1321  ? ALKPHOS 60 10/22/2021 1321  ? BILITOT 0.5 10/22/2021 1321  ? ?   ? ?  ?RADIOGRAPHY: US BREAST LTD UNI LEFT INC AXILLA ? ?Result Date: 01/28/2022 ?CLINICAL DATA:  Recall from screening mammography, possible mass or focal asymmetry involving the UPPER OUTER  QUADRANT of the LEFT breast at middle to posterior depth. EXAM: DIGITAL DIAGNOSTIC UNILATERAL LEFT MAMMOGRAM WITH TOMOSYNTHESIS AND CAD; ULTRASOUND LEFT BREAST LIMITED TECHNIQUE: Left digital diagnostic mammography and breast tomosynthesis was performed. The images were evaluated with computer-aided detection.; Targeted ultrasound examination of the left breast was performed. COMPARISON:  Previous exam(s). ACR Breast Density Category b: There are scattered areas of fibroglandular density. FINDINGS: Spot-compression CC and MLO views of the area of concern were obtained. There is an isodense mass measuring just under 1 cm with somewhat vague margins in the UPPER OUTER QUADRANT at middle to posterior depth, corresponding to the screening mammographic finding. There may be subtle associated  architectural distortion. There are no suspicious calcifications associated with the mass. Targeted ultrasound is performed, demonstrating a hypoechoic mass with vague margins at the 1 o'clock position 8 cm from nipple at

## 2022-02-18 NOTE — Progress Notes (Signed)
Location of Breast Cancer:  ?Malignant neoplasm of upper-outer quadrant of left breast in female, estrogen receptor positive ? ?Histology per Pathology Report:  ?(Definitive pathology pending upcoming surgery) ?02/08/2022 ?Breast, left, needle core biopsy, 1:00 ?- INVASIVE DUCTAL CARCINOMA. ?- THE LINEAR EXTENT OF CARCINOMA IN THE LONGEST CORE: 6 MM. ?- NOTTINGHAM SCORE: 3+2+1, GRADE 2. ?- NEGATIVE FOR TUMOR NECROSIS, LYMPHOVASCULAR INVASION AND MICROCALCIFICATION. ?- THERE IS NO ACCOMPANYING COMPONENT OF DCIS. ?- RESULTS OF PROGNOSTIC MARKERS (ER, PR, KI-67 AND HER-2/NEU) WILL BE REPORTED AS AN ADDENDUM. ? ?Receptor Status: ER(100%), PR (2%), Her2-neu (Positive via FISH), Ki-67(30%) ? ?Did patient present with symptoms (if so, please note symptoms) or was this found on screening mammography?: Patient underwent a screening mammogram that showed an upper outer quadrant left asymmetry less than 1 cm. On ultrasound this was a 6 x 5 x 5 mm lesion. There is no associated lymphadenopathy ? ?Past/Anticipated interventions by surgeon, if any:  ?03/16/2022 ?--Dr. Rolm Bookbinder ?Scheduled for: LEFT BREAST LUMPECTOMY WITH RADIOACTIVE SEED AND AXILLARY SENTINEL LYMPH NODE BIOPSY ? ?02/12/2022 ?--Dr. Rolm Bookbinder (office visit) ?Left breast seed guided lumpectomy, left ax sn biopsy ?[place]/PT appt, preop bra, med onc and rad onc ?Chemotherapy/anti her2 therapy possible awaiting results but plan to do surgery to assess final size likely before determining therapy. ?Plan for surgery early June after her tennis tournament ? ?Past/Anticipated interventions by medical oncology, if any:  ?Under care of Dr. Nicholas Lose ?02/17/2022 ?--Treatment plan: ?Breast conserving surgery  ?Adjuvant systemic chemotherapy would depend on final tumor size.  Possibly no chemo versus Taxol Herceptin ?Adjuvant radiation ?Followed by adjuvant antiestrogen therapy ?--Return to clinic after surgery to discuss final pathology report ? ?Lymphedema  issues, if any:  None   ? ?Pain issues, if any:  Patient denies and report good range of motion to her left arm/shoulder ? ?SAFETY ISSUES: ?Prior radiation? No ?Pacemaker/ICD? No ?Possible current pregnancy? No--postmenopausal  ?Is the patient on methotrexate? No ? ?Current Complaints / other details:  Going to [place] at the end of the month for a 4-day tennis tournament.  ?   ? ? ? ?

## 2022-02-18 NOTE — Telephone Encounter (Signed)
.  Called patient to schedule appointment per 5/11 inbasket, patient is aware of date and time.   ?

## 2022-02-19 ENCOUNTER — Ambulatory Visit
Admission: RE | Admit: 2022-02-19 | Discharge: 2022-02-19 | Disposition: A | Discharge status: Home / Self Care | Payer: Medicare Other | Source: Ambulatory Visit | Attending: Radiation Oncology | Admitting: Radiation Oncology

## 2022-02-19 ENCOUNTER — Other Ambulatory Visit: Payer: Self-pay

## 2022-02-19 ENCOUNTER — Encounter: Payer: Self-pay | Admitting: Radiation Oncology

## 2022-02-19 VITALS — BP 143/75 | HR 58 | Temp 97.2°F | Resp 18 | Ht 65.5 in | Wt 154.4 lb

## 2022-02-19 DIAGNOSIS — Z79899 Other long term (current) drug therapy: Secondary | ICD-10-CM | POA: Diagnosis not present

## 2022-02-19 DIAGNOSIS — E039 Hypothyroidism, unspecified: Secondary | ICD-10-CM | POA: Diagnosis not present

## 2022-02-19 DIAGNOSIS — E785 Hyperlipidemia, unspecified: Secondary | ICD-10-CM | POA: Insufficient documentation

## 2022-02-19 DIAGNOSIS — Z85820 Personal history of malignant melanoma of skin: Secondary | ICD-10-CM | POA: Insufficient documentation

## 2022-02-19 DIAGNOSIS — Z170 Estrogen receptor positive status [ER+]: Secondary | ICD-10-CM

## 2022-02-19 DIAGNOSIS — Z923 Personal history of irradiation: Secondary | ICD-10-CM | POA: Diagnosis not present

## 2022-02-19 DIAGNOSIS — C50412 Malignant neoplasm of upper-outer quadrant of left female breast: Secondary | ICD-10-CM | POA: Diagnosis not present

## 2022-02-25 ENCOUNTER — Ambulatory Visit (HOSPITAL_BASED_OUTPATIENT_CLINIC_OR_DEPARTMENT_OTHER): Payer: Medicare Other

## 2022-02-25 ENCOUNTER — Encounter: Payer: Self-pay | Admitting: *Deleted

## 2022-02-26 ENCOUNTER — Encounter: Payer: Self-pay | Admitting: Licensed Clinical Social Worker

## 2022-02-26 NOTE — Progress Notes (Signed)
[facility] Clinical Social Work  Clinical Social Work was referred by new patient protocol for assessment of psychosocial needs.  Clinical Social Worker attempted to contact patient by phone  to offer support and assess for needs.   No answer. Left VM with direct contact information.      Ange Puskas E Tj Kitchings, LCSW  Clinical Social Worker [facility]        

## 2022-03-04 ENCOUNTER — Other Ambulatory Visit: Payer: Self-pay

## 2022-03-04 ENCOUNTER — Encounter (HOSPITAL_BASED_OUTPATIENT_CLINIC_OR_DEPARTMENT_OTHER): Payer: Self-pay | Admitting: General Surgery

## 2022-03-05 ENCOUNTER — Inpatient Hospital Stay: Payer: Medicare Other | Admitting: Licensed Clinical Social Worker

## 2022-03-05 ENCOUNTER — Encounter: Payer: Self-pay | Admitting: General Practice

## 2022-03-05 MED ORDER — CHLORHEXIDINE GLUCONATE CLOTH 2 % EX PADS: 6.0000 | MEDICATED_PAD | Freq: Once | CUTANEOUS | Status: DC

## 2022-03-05 MED ORDER — ENSURE PRE-SURGERY PO LIQD: 296.0000 mL | Freq: Once | ORAL | Status: DC

## 2022-03-05 NOTE — Progress Notes (Signed)
[place] Social Work  Patient presented to [facility]  to review and complete healthcare advance directives.  Chaplain/Clinical Social Worker met with patient.  The patient designated Nea Gittens (husband) as their primary healthcare agent and Jeriyah Granlund (daughter) as their secondary agent.  Patient also completed healthcare living will.    Documents were notarized and copies made for patient/family. Clinical Social Worker will send documents to medical records to be scanned into patient's chart. Clinical Social Worker encouraged patient/family to contact with any additional questions or concerns.   [place], [place] Worker [employer]

## 2022-03-05 NOTE — Progress Notes (Signed)
[facility] Spiritual Care Note  Assisted Julie Pugh in completing her Advance Directives in [facility] this afternoon.  She gives her husband Herbie Baltimore and daughter Gearlean Alf.  One of her top priorities at the end of her life is to enable Herbie Baltimore and Cloyde Reams to be able to visit with her to say goodbye, so she is open to temporary interventions that would keep her alive for that opportunity.  Ensured also that Mary is aware of ongoing Spiritual Care availability, should needs/desire arise.   [place], [place], [facility] Pager [phone] Voicemail [phone]

## 2022-03-15 ENCOUNTER — Ambulatory Visit
Admission: RE | Admit: 2022-03-15 | Discharge: 2022-03-15 | Disposition: A | Discharge status: Home / Self Care | Payer: Medicare Other | Source: Ambulatory Visit | Attending: General Surgery | Admitting: General Surgery

## 2022-03-15 DIAGNOSIS — Z170 Estrogen receptor positive status [ER+]: Secondary | ICD-10-CM

## 2022-03-15 DIAGNOSIS — C50912 Malignant neoplasm of unspecified site of left female breast: Secondary | ICD-10-CM | POA: Diagnosis not present

## 2022-03-15 NOTE — Progress Notes (Signed)
Patient Care Team: [name], MD as PCP - General (Family Medicine) Philemon Kingdom, MD as Consulting Physician (Internal Medicine)  DIAGNOSIS:  Encounter Diagnosis  Name Primary?   Malignant neoplasm of upper-outer quadrant of left breast in female, estrogen receptor positive ([place]) Yes    SUMMARY OF ONCOLOGIC HISTORY: Oncology History  Malignant neoplasm of upper-outer quadrant of left breast in female, estrogen receptor positive ([place])  02/12/2022 Initial Diagnosis   Screening mammogram detected left breast mass, by ultrasound measured 0.6 cm, biopsy revealed grade 2 IDC ER 100%, PR 2%, HER2 equivocal by IHC and FISH positive, Ki-67 30%   02/17/2022 Cancer Staging   Staging form: Breast, AJCC 8th Edition - Clinical: Stage IA (cT1b, cN0, cM0, G2, ER+, PR-, HER2+) - Signed by Nicholas Lose, MD on 02/17/2022 Stage prefix: Initial diagnosis Histologic grading system: 3 grade system   03/16/2022 Surgery   Left lumpectomy: Grade 3 IDC 5 mm, negative for lymphovascular invasion, ER 100%, PR 2%, HER2 FISH positive, margins negative, 0/2 lymph nodes negative   04/05/2022 -  Chemotherapy   Patient is on Treatment Plan : BREAST Trastuzumab q21d x 13 cycles       CHIEF COMPLIANT: Follow-up after surgery Lumpectomy  INTERVAL HISTORY: Julie Pugh is a 66 y.o. female is here because of recent diagnosis of left Breast cancer. She presents to the clinic today for a follow-up after surgery. She states that surgery went great. States that she walks 30 miles a month. States that she already has some hot flashes and some thinning of the hair that came from Covid.   ALLERGIES:  has no allergies on file.  MEDICATIONS:  Current Outpatient Medications  Medication Sig Dispense Refill   Bioflavonoid Products (ESTER-C) [phone] MG TABS Take by mouth.     Cholecalciferol (VITAMIN D) 2000 units tablet Take 5,000 Units by mouth daily.     levothyroxine (SYNTHROID) 100 MCG tablet TAKE 1  TABLET(100 MCG) BY MOUTH DAILY 90 tablet 3   METRONIDAZOLE, TOPICAL, 0.75 % LOTN APP TO FACE AT NIGHT UTD     Multiple Vitamins-Minerals (CENTRUM SILVER 50+WOMEN PO) Take by mouth.     traMADol (ULTRAM) 50 MG tablet Take 2 tablets (100 mg total) by mouth every 6 (six) hours as needed. 12 tablet 0   No current facility-administered medications for this visit.    PHYSICAL EXAMINATION: ECOG PERFORMANCE STATUS: 1 - Symptomatic but completely ambulatory  Vitals:   03/29/22 1009  BP: (!) 156/70  Pulse: 72  Resp: 18  Temp: 97.7 F (36.5 C)  SpO2: 99%   Filed Weights   03/29/22 1009  Weight: 154 lb 14.4 oz (70.3 kg)      LABORATORY DATA:  I have reviewed the data as listed    Latest Ref Rng & Units 10/22/2021    1:21 PM 10/08/2020    9:07 AM 06/13/2020    8:30 AM  CMP  Glucose 70 - 99 mg/dL 87  89  79   BUN 6 - 23 mg/dL 20  18  21    Creatinine 0.40 - 1.20 mg/dL 0.95  0.87  0.91   Sodium 135 - 145 mEq/L 140  140  141   Potassium 3.5 - 5.1 mEq/L 4.2  4.4  4.3   Chloride 96 - 112 mEq/L 105  105  105   CO2 19 - 32 mEq/L 29  27  27    Calcium 8.4 - 10.5 mg/dL 9.4  9.4  9.2   Total Protein 6.0 -  8.3 g/dL 7.2  7.0  6.3   Total Bilirubin 0.2 - 1.2 mg/dL 0.5  0.7  0.6   Alkaline Phos 39 - 117 U/L 60  65  53   AST 0 - 37 U/L 20  16  13    ALT 0 - 35 U/L 17  16  12      Lab Results  Component Value Date   WBC 5.9 10/22/2021   HGB 14.5 10/22/2021   HCT 44.1 10/22/2021   MCV 90.0 10/22/2021   PLT 297.0 10/22/2021   NEUTROABS 3.1 10/22/2021    ASSESSMENT & PLAN:  Malignant neoplasm of upper-outer quadrant of left breast in female, estrogen receptor positive ([place]) 02/12/2022:Screening mammogram detected left breast mass, by ultrasound measured 0.6 cm, biopsy revealed grade 2 IDC ER 100%, PR 2%, HER2 equivocal by IHC and FISH positive, Ki-67 30%    Treatment plan: 1.  03/16/2022 Left lumpectomy: Grade 3 IDC 5 mm (6 mm on biopsy), negative for lymphovascular invasion, ER 100%, PR 2%,  HER2 FISH positive, margins negative, 0/2 lymph nodes negative 2.  based on only 6 mm tumor size, we discussed the pros and cons of systemic treatments and decided to treat her with Herceptin with letrozole. 3.  Adjuvant radiation 4.  Followed by adjuvant antiestrogen therapy ---------------------------------------------------------------------------- Pathology counseling: I discussed the final pathology report of the patient provided  a copy of this report. I discussed the margins as well as lymph node surgeries. We also discussed the final staging along with previously performed ER/PR and HER-2/neu testing.  Plan to start Herceptin within next 1 to 2 weeks. Echocardiogram will be obtained next week.  Return to clinic every 3 weeks for Herceptin injections and every 6 weeks for follow-up with me. She does not need any labs to be done with Herceptin.  Letrozole counseling: We discussed the risks and benefits of anti-estrogen therapy with aromatase inhibitors. These include but not limited to insomnia, hot flashes, mood changes, vaginal dryness, bone density loss, and weight gain. We strongly believe that the benefits far outweigh the risks. Patient understands these risks and consented to starting treatment. Planned treatment duration is 5-7 years.  We will order a bone density test.  Bone density in February 2021: T score +0.4 Genetic testing will also be ordered.   Orders Placed This Encounter  Procedures   DG Bone Density    Standing Status:   Future    Standing Expiration Date:   03/30/2023    Order Specific Question:   Reason for Exam (SYMPTOM  OR DIAGNOSIS REQUIRED)    Answer:   post [id number]    Order Specific Question:   Preferred imaging location?    Answer:   [facility]   PHYSICIAN COMMUNICATION ORDER    Post Neo-adjuvant AC-THP.  Repeat Trastuzumab 8 mg/kg Loading Dose D1 if dose missed by > 1 week.   ONCBCN PHYSICIAN COMMUNICATION 1    Trastuzumab SQ options for each  treatment day available via "Add Orders".   PHYSICIAN COMMUNICATION ORDER    A baseline Echo/Muga should be obtained prior to initiation of Trastuzumab, at 3, 6, 9 months during Trastuzumab treatment.   ECHOCARDIOGRAM COMPLETE    Standing Status:   Future    Standing Expiration Date:   03/30/2023    Order Specific Question:   Where should this test be performed    Answer:   [facility]    Order Specific Question:   Perflutren DEFINITY (image enhancing agent) should be administered unless  hypersensitivity or allergy exist    Answer:   Administer Perflutren    Order Specific Question:   Reason for exam-Echo    Answer:   Chemo  Z09   The patient has a good understanding of the overall plan. she agrees with it. she will call with any problems that may develop before the next visit here. Total time spent: 45 mins including face to face time and time spent for planning, charting and co-ordination of care   Harriette Ohara, MD 03/29/22    I Gardiner Coins am scribing for Dr. Lindi Adie  I have reviewed the above documentation for accuracy and completeness, and I agree with the above.

## 2022-03-15 NOTE — Anesthesia Preprocedure Evaluation (Signed)
Anesthesia Evaluation  Patient identified by MRN, date of birth, ID band Patient awake    Reviewed: Allergy & Precautions, NPO status , Patient's Chart, lab work & pertinent test results  Airway Mallampati: II  TM Distance: >3 FB Neck ROM: Full    Dental  (+) Dental Advisory Given, Teeth Intact, Implants,    Pulmonary neg pulmonary ROS,    Pulmonary exam normal breath sounds clear to auscultation       Cardiovascular negative cardio ROS Normal cardiovascular exam Rhythm:Regular Rate:Normal     Neuro/Psych negative neurological ROS     GI/Hepatic negative GI ROS, Neg liver ROS,   Endo/Other  Hypothyroidism   Renal/GU negative Renal ROS     Musculoskeletal negative musculoskeletal ROS (+)   Abdominal   Peds  Hematology negative hematology ROS (+)   Anesthesia Other Findings   Reproductive/Obstetrics                           Anesthesia Physical Anesthesia Plan  ASA: 2  Anesthesia Plan: General   Post-op Pain Management: Tylenol PO (pre-op)* and Regional block*   Induction: Intravenous  PONV Risk Score and Plan: 3 and Ondansetron, Dexamethasone, Treatment may vary due to age or medical condition and Midazolam  Airway Management Planned: LMA  Additional Equipment: None  Intra-op Plan:   Post-operative Plan: Extubation in OR  Informed Consent: I have reviewed the patients History and Physical, chart, labs and discussed the procedure including the risks, benefits and alternatives for the proposed anesthesia with the patient or authorized representative who has indicated his/her understanding and acceptance.     Dental advisory given  Plan Discussed with: CRNA  Anesthesia Plan Comments:       Anesthesia Quick Evaluation

## 2022-03-16 ENCOUNTER — Encounter (HOSPITAL_BASED_OUTPATIENT_CLINIC_OR_DEPARTMENT_OTHER): Payer: Self-pay | Admitting: General Surgery

## 2022-03-16 ENCOUNTER — Ambulatory Visit (HOSPITAL_BASED_OUTPATIENT_CLINIC_OR_DEPARTMENT_OTHER): Payer: Medicare Other | Admitting: Anesthesiology

## 2022-03-16 ENCOUNTER — Ambulatory Visit
Admission: RE | Admit: 2022-03-16 | Discharge: 2022-03-16 | Disposition: A | Discharge status: Home / Self Care | Payer: Medicare Other | Source: Ambulatory Visit | Attending: General Surgery | Admitting: General Surgery

## 2022-03-16 ENCOUNTER — Encounter (HOSPITAL_BASED_OUTPATIENT_CLINIC_OR_DEPARTMENT_OTHER)
Admission: RE | Disposition: A | Discharge status: Home / Self Care | Payer: Self-pay | Source: Home / Self Care | Attending: General Surgery

## 2022-03-16 ENCOUNTER — Ambulatory Visit (HOSPITAL_BASED_OUTPATIENT_CLINIC_OR_DEPARTMENT_OTHER)
Admission: RE | Admit: 2022-03-16 | Discharge: 2022-03-16 | Disposition: A | Discharge status: Home / Self Care | Payer: Medicare Other | Attending: General Surgery | Admitting: General Surgery

## 2022-03-16 DIAGNOSIS — Z170 Estrogen receptor positive status [ER+]: Secondary | ICD-10-CM

## 2022-03-16 DIAGNOSIS — Z01818 Encounter for other preprocedural examination: Secondary | ICD-10-CM

## 2022-03-16 DIAGNOSIS — C50412 Malignant neoplasm of upper-outer quadrant of left female breast: Secondary | ICD-10-CM

## 2022-03-16 DIAGNOSIS — G8918 Other acute postprocedural pain: Secondary | ICD-10-CM | POA: Diagnosis not present

## 2022-03-16 DIAGNOSIS — E039 Hypothyroidism, unspecified: Secondary | ICD-10-CM | POA: Insufficient documentation

## 2022-03-16 DIAGNOSIS — R928 Other abnormal and inconclusive findings on diagnostic imaging of breast: Secondary | ICD-10-CM | POA: Diagnosis not present

## 2022-03-16 HISTORY — PX: BREAST LUMPECTOMY WITH RADIOACTIVE SEED AND SENTINEL LYMPH NODE BIOPSY: SHX6550

## 2022-03-16 SURGERY — BREAST LUMPECTOMY WITH RADIOACTIVE SEED AND SENTINEL LYMPH NODE BIOPSY
Anesthesia: General | Site: Breast | Laterality: Left

## 2022-03-16 MED ORDER — LIDOCAINE 2% (20 MG/ML) 5 ML SYRINGE
INTRAMUSCULAR | Status: DC | PRN
  Administered 2022-03-16: 60 mg via INTRAVENOUS

## 2022-03-16 MED ORDER — ONDANSETRON HCL 4 MG/2ML IJ SOLN
INTRAMUSCULAR | Status: DC | PRN
  Administered 2022-03-16: 4 mg via INTRAVENOUS

## 2022-03-16 MED ORDER — PROPOFOL 500 MG/50ML IV EMUL
INTRAVENOUS | Status: AC
  Filled 2022-03-16: qty 50

## 2022-03-16 MED ORDER — OXYCODONE HCL 5 MG/5ML PO SOLN: 5.0000 mg | Freq: Once | ORAL | Status: DC | PRN

## 2022-03-16 MED ORDER — DEXAMETHASONE SODIUM PHOSPHATE 4 MG/ML IJ SOLN
INTRAMUSCULAR | Status: DC | PRN
  Administered 2022-03-16: 5 mg via PERINEURAL

## 2022-03-16 MED ORDER — CLONIDINE HCL (ANALGESIA) 100 MCG/ML EP SOLN
EPIDURAL | Status: DC | PRN
  Administered 2022-03-16: 80 ug

## 2022-03-16 MED ORDER — ROPIVACAINE HCL 5 MG/ML IJ SOLN
INTRAMUSCULAR | Status: DC | PRN
  Administered 2022-03-16: 30 mL via EPIDURAL

## 2022-03-16 MED ORDER — BUPIVACAINE HCL (PF) 0.25 % IJ SOLN
INTRAMUSCULAR | Status: AC
  Filled 2022-03-16: qty 30

## 2022-03-16 MED ORDER — AMISULPRIDE (ANTIEMETIC) 5 MG/2ML IV SOLN: 10.0000 mg | Freq: Once | INTRAVENOUS | Status: DC | PRN

## 2022-03-16 MED ORDER — DEXAMETHASONE SODIUM PHOSPHATE 4 MG/ML IJ SOLN
INTRAMUSCULAR | Status: DC | PRN
  Administered 2022-03-16: 8 mg via INTRAVENOUS

## 2022-03-16 MED ORDER — TRAMADOL HCL 50 MG PO TABS: 100.0000 mg | ORAL_TABLET | Freq: Four times a day (QID) | ORAL | 0 refills | Status: DC | PRN

## 2022-03-16 MED ORDER — PROPOFOL 10 MG/ML IV BOLUS
INTRAVENOUS | Status: DC | PRN
  Administered 2022-03-16: 140 mg via INTRAVENOUS
  Administered 2022-03-16: 20 mg via INTRAVENOUS

## 2022-03-16 MED ORDER — FENTANYL CITRATE (PF) 100 MCG/2ML IJ SOLN
INTRAMUSCULAR | Status: AC
  Filled 2022-03-16: qty 2

## 2022-03-16 MED ORDER — OXYCODONE HCL 5 MG PO TABS: 5.0000 mg | ORAL_TABLET | Freq: Once | ORAL | Status: DC | PRN

## 2022-03-16 MED ORDER — LIDOCAINE 2% (20 MG/ML) 5 ML SYRINGE
INTRAMUSCULAR | Status: AC
Start: 2022-03-16 — End: ?
  Filled 2022-03-16: qty 5

## 2022-03-16 MED ORDER — BUPIVACAINE HCL (PF) 0.25 % IJ SOLN
INTRAMUSCULAR | Status: DC | PRN
  Administered 2022-03-16: 3 mL

## 2022-03-16 MED ORDER — MIDAZOLAM HCL 2 MG/2ML IJ SOLN
1.0000 mg | Freq: Once | INTRAMUSCULAR | Status: AC
  Administered 2022-03-16: 1 mg via INTRAVENOUS

## 2022-03-16 MED ORDER — MIDAZOLAM HCL 2 MG/2ML IJ SOLN
INTRAMUSCULAR | Status: AC
  Filled 2022-03-16: qty 2

## 2022-03-16 MED ORDER — FENTANYL CITRATE (PF) 100 MCG/2ML IJ SOLN
50.0000 ug | Freq: Once | INTRAMUSCULAR | Status: AC
  Administered 2022-03-16: 50 ug via INTRAVENOUS

## 2022-03-16 MED ORDER — CEFAZOLIN SODIUM-DEXTROSE 2-4 GM/100ML-% IV SOLN
2.0000 g | INTRAVENOUS | Status: AC
  Administered 2022-03-16: 2 g via INTRAVENOUS

## 2022-03-16 MED ORDER — PROPOFOL 10 MG/ML IV BOLUS
INTRAVENOUS | Status: AC
  Filled 2022-03-16: qty 20

## 2022-03-16 MED ORDER — PROPOFOL 500 MG/50ML IV EMUL
INTRAVENOUS | Status: DC | PRN
  Administered 2022-03-16: 120 ug/kg/min via INTRAVENOUS
  Administered 2022-03-16: 150 ug/kg/min via INTRAVENOUS

## 2022-03-16 MED ORDER — METHYLENE BLUE 1 % INJ SOLN
INTRAVENOUS | Status: AC
  Filled 2022-03-16: qty 10

## 2022-03-16 MED ORDER — LACTATED RINGERS IV SOLN: INTRAVENOUS | Status: DC

## 2022-03-16 MED ORDER — DEXAMETHASONE SODIUM PHOSPHATE 10 MG/ML IJ SOLN
INTRAMUSCULAR | Status: AC
  Filled 2022-03-16: qty 1

## 2022-03-16 MED ORDER — ACETAMINOPHEN 500 MG PO TABS: 1000.0000 mg | ORAL_TABLET | ORAL | Status: DC

## 2022-03-16 MED ORDER — ACETAMINOPHEN 500 MG PO TABS
1000.0000 mg | ORAL_TABLET | Freq: Once | ORAL | Status: AC
  Administered 2022-03-16: 1000 mg via ORAL

## 2022-03-16 MED ORDER — ONDANSETRON HCL 4 MG/2ML IJ SOLN
INTRAMUSCULAR | Status: AC
  Filled 2022-03-16: qty 2

## 2022-03-16 MED ORDER — CEFAZOLIN SODIUM-DEXTROSE 2-4 GM/100ML-% IV SOLN
INTRAVENOUS | Status: AC
  Filled 2022-03-16: qty 100

## 2022-03-16 MED ORDER — 0.9 % SODIUM CHLORIDE (POUR BTL) OPTIME
TOPICAL | Status: DC | PRN
  Administered 2022-03-16: 1000 mL

## 2022-03-16 MED ORDER — FENTANYL CITRATE (PF) 100 MCG/2ML IJ SOLN: 25.0000 ug | INTRAMUSCULAR | Status: DC | PRN

## 2022-03-16 MED ORDER — ACETAMINOPHEN 500 MG PO TABS
ORAL_TABLET | ORAL | Status: AC
  Filled 2022-03-16: qty 2

## 2022-03-16 SURGICAL SUPPLY — 59 items
ADH SKN CLS APL DERMABOND .7 (GAUZE/BANDAGES/DRESSINGS) ×1
APL PRP STRL LF DISP 70% ISPRP (MISCELLANEOUS) ×1
APPLIER CLIP 9.375 MED OPEN (MISCELLANEOUS)
APR CLP MED 9.3 20 MLT OPN (MISCELLANEOUS)
BINDER BREAST LRG (GAUZE/BANDAGES/DRESSINGS) IMPLANT
BINDER BREAST MEDIUM (GAUZE/BANDAGES/DRESSINGS) IMPLANT
BINDER BREAST XLRG (GAUZE/BANDAGES/DRESSINGS) IMPLANT
BINDER BREAST XXLRG (GAUZE/BANDAGES/DRESSINGS) IMPLANT
BLADE SURG 15 STRL LF DISP TIS (BLADE) ×1 IMPLANT
BLADE SURG 15 STRL SS (BLADE) ×2
CANISTER SUC SOCK COL 7IN (MISCELLANEOUS) IMPLANT
CANISTER SUCT 1200ML W/VALVE (MISCELLANEOUS) IMPLANT
CHLORAPREP W/TINT 26 (MISCELLANEOUS) ×2 IMPLANT
CLIP APPLIE 9.375 MED OPEN (MISCELLANEOUS) IMPLANT
CLIP TI WIDE RED SMALL 6 (CLIP) ×2 IMPLANT
COVER BACK TABLE 60X90IN (DRAPES) ×2 IMPLANT
COVER MAYO STAND STRL (DRAPES) ×2 IMPLANT
COVER PROBE W GEL 5X96 (DRAPES) ×2 IMPLANT
DERMABOND ADVANCED (GAUZE/BANDAGES/DRESSINGS) ×1
DERMABOND ADVANCED .7 DNX12 (GAUZE/BANDAGES/DRESSINGS) ×1 IMPLANT
DRAPE LAPAROSCOPIC ABDOMINAL (DRAPES) ×2 IMPLANT
DRAPE UTILITY XL STRL (DRAPES) ×2 IMPLANT
ELECT COATED BLADE 2.86 ST (ELECTRODE) ×2 IMPLANT
ELECT REM PT RETURN 9FT ADLT (ELECTROSURGICAL) ×2
ELECTRODE REM PT RTRN 9FT ADLT (ELECTROSURGICAL) ×1 IMPLANT
GLOVE BIO SURGEON STRL SZ7 (GLOVE) ×4 IMPLANT
GLOVE BIOGEL PI IND STRL 7.5 (GLOVE) ×1 IMPLANT
GLOVE BIOGEL PI INDICATOR 7.5 (GLOVE) ×1
GOWN STRL REUS W/ TWL LRG LVL3 (GOWN DISPOSABLE) ×2 IMPLANT
GOWN STRL REUS W/TWL LRG LVL3 (GOWN DISPOSABLE) ×4
HEMOSTAT ARISTA ABSORB 3G PWDR (HEMOSTASIS) IMPLANT
KIT MARKER MARGIN INK (KITS) ×2 IMPLANT
NDL HYPO 25X1 1.5 SAFETY (NEEDLE) ×1 IMPLANT
NDL SAFETY ECLIPSE 18X1.5 (NEEDLE) IMPLANT
NEEDLE HYPO 18GX1.5 SHARP (NEEDLE)
NEEDLE HYPO 25X1 1.5 SAFETY (NEEDLE) ×2 IMPLANT
NS IRRIG 1000ML POUR BTL (IV SOLUTION) IMPLANT
PACK BASIN DAY SURGERY FS (CUSTOM PROCEDURE TRAY) ×2 IMPLANT
PENCIL SMOKE EVACUATOR (MISCELLANEOUS) ×2 IMPLANT
RETRACTOR ONETRAX LX 90X20 (MISCELLANEOUS) ×1 IMPLANT
SLEEVE SCD COMPRESS KNEE MED (STOCKING) ×2 IMPLANT
SPIKE FLUID TRANSFER (MISCELLANEOUS) IMPLANT
SPONGE T-LAP 4X18 ~~LOC~~+RFID (SPONGE) ×2 IMPLANT
STRIP CLOSURE SKIN 1/2X4 (GAUZE/BANDAGES/DRESSINGS) ×2 IMPLANT
SUT ETHILON 2 0 FS 18 (SUTURE) IMPLANT
SUT MNCRL AB 4-0 PS2 18 (SUTURE) ×3 IMPLANT
SUT MON AB 5-0 PS2 18 (SUTURE) ×1 IMPLANT
SUT SILK 2 0 SH (SUTURE) ×2 IMPLANT
SUT VIC AB 2-0 SH 27 (SUTURE) ×2
SUT VIC AB 2-0 SH 27XBRD (SUTURE) ×1 IMPLANT
SUT VIC AB 3-0 SH 27 (SUTURE) ×4
SUT VIC AB 3-0 SH 27X BRD (SUTURE) ×1 IMPLANT
SUT VIC AB 5-0 PS2 18 (SUTURE) IMPLANT
SYR CONTROL 10ML LL (SYRINGE) ×2 IMPLANT
TOWEL GREEN STERILE FF (TOWEL DISPOSABLE) ×2 IMPLANT
TRACER MAGTRACE VIAL (MISCELLANEOUS) IMPLANT
TRAY FAXITRON CT DISP (TRAY / TRAY PROCEDURE) ×2 IMPLANT
TUBE CONNECTING 20X1/4 (TUBING) IMPLANT
YANKAUER SUCT BULB TIP NO VENT (SUCTIONS) IMPLANT

## 2022-03-16 NOTE — Transfer of Care (Signed)
Immediate Anesthesia Transfer of Care Note  Patient: Roshan Salamon  Procedure(s) Performed: LEFT BREAST LUMPECTOMY WITH RADIOACTIVE SEED AND AXILLARY SENTINEL LYMPH NODE BIOPSY (Left: Breast)  Patient Location: PACU  Anesthesia Type:General and Regional  Level of Consciousness: drowsy  Airway & Oxygen Therapy: Patient Spontanous Breathing and Patient connected to face mask oxygen  Post-op Assessment: Report given to RN and Post -op Vital signs reviewed and stable  Post vital signs: Reviewed and stable  Last Vitals:  Vitals Value Taken Time  BP 112/53 03/16/22 0956  Temp    Pulse 63 03/16/22 0957  Resp 18 03/16/22 0957  SpO2 99 % 03/16/22 0957  Vitals shown include unvalidated device data.  Last Pain:  Vitals:   03/16/22 0728  TempSrc: Oral  PainSc: 0-No pain         Complications: No notable events documented.

## 2022-03-16 NOTE — H&P (Signed)
66 y.o. female is an otherwise healthy female with a history of a lower extremity melanoma. She has a prior benign stereotactic breast biopsy. She has B density breast. She had no mass or discharge and underwent a screening mammogram that showed an upper outer quadrant left asymmetry less than 1 cm. On ultrasound this was a 6 x 5 x 5 mm lesion. There is no associated lymphadenopathy. 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 HER2 is equivocal and FISH is now pending with results to be out hopefully soon. She is a with her husband to discuss options today.  Review of Systems: A complete review of systems was obtained from the patient. I have reviewed this information and discussed as appropriate with the patient. See HPI as well for other ROS.  Review of Systems  All other systems reviewed and are negative.  Medical History: Past Medical History:  Diagnosis Date   History of cancer   Patient Active Problem List  Diagnosis   History of melanoma   Hypothyroidism   Past Surgical History:  Procedure Laterality Date   finger ampulation 2002    No Known Allergies  Current Outpatient Medications on File Prior to Visit  Medication Sig Dispense Refill   levothyroxine (SYNTHROID) 100 MCG tablet TAKE 1 TABLET(100 MCG) BY MOUTH DAILY   cholecalciferol (VITAMIN D3) 2,000 unit tablet Take by mouth    Family History  Problem Relation Age of Onset   High blood pressure (Hypertension) Mother   Coronary Artery Disease (Blocked arteries around heart) Father   Hyperlipidemia (Elevated cholesterol) Sister    Social History   Tobacco Use  Smoking Status Never  Smokeless Tobacco Never    Social History   Socioeconomic History   Marital status: Married  Tobacco Use   Smoking status: Never   Smokeless tobacco: Never  Vaping Use   Vaping Use: Never used  Substance and Sexual Activity   Alcohol use: Never    Drug use: Never   Objective:   Physical Exam Vitals reviewed.  Constitutional:  Appearance: Normal appearance.  Chest:  Breasts: Right: No inverted nipple, mass or nipple discharge.  Left: No inverted nipple, mass or nipple discharge.  Lymphadenopathy:  Upper Body:  Right upper body: No supraclavicular or axillary adenopathy.  Left upper body: No supraclavicular or axillary adenopathy.  Neurological:  Mental Status: She is alert.    Assessment and Plan:   Carcinoma of upper-outer quadrant of left breast in female, estrogen receptor positive (CMS-HCC)  Left breast seed guided lumpectomy, left ax sn biopsy  We discussed the staging and pathophysiology of breast cancer. We discussed all of the different options for treatment for breast cancer including surgery, chemotherapy, radiation therapy, Herceptin, and antiestrogen therapy. We discussed a sentinel lymph node biopsy as she does not appear to having lymph node involvement right now. We discussed the performance of that with injection of tracer. We discussed that there is a chance of having a positive node with a sentinel lymph node biopsy and we will await the permanent pathology to make any other first further decisions in terms of her treatment. We discussed up to a 5% risk lifetime of chronic shoulder pain as well as lymphedema associated with a sentinel lymph node biopsy. We discussed the options for treatment of the breast cancer which included lumpectomy versus a mastectomy. We discussed the performance of the lumpectomy with radioactive seed placement. We  discussed a 5-10% chance of a positive margin requiring reexcision in the operating room. We also discussed that she will likely need radiation therapy if she undergoes lumpectomy. We discussed mastectomy and the postoperative care for that as well. Mastectomy can be followed by reconstruction. The decision for lumpectomy vs mastectomy has no impact on decision for chemotherapy.  Most mastectomy patients will not need radiation therapy. We discussed that there is no difference in her survival whether she undergoes lumpectomy with radiation therapy or antiestrogen therapy versus a mastectomy. There is also no real difference between her recurrence in the breast. We discussed the risks of operation including bleeding, infection, possible reoperation. She understands her further therapy will be based on what her stages at the time of her operation. Chemotherapy/anti her2 therapy possible awaiting results but plan to do surgery to assess final size likely before determining therapy.

## 2022-03-16 NOTE — Anesthesia Procedure Notes (Signed)
Anesthesia Regional Block: Pectoralis block   Pre-Anesthetic Checklist: , timeout performed,  Correct Patient, Correct Site, Correct Laterality,  Correct Procedure, Correct Position, site marked,  Risks and benefits discussed,  Surgical consent,  Pre-op evaluation,  At surgeon's request and post-op pain management  Laterality: Left  Prep: chloraprep       Needles:   Needle Type: Stimiplex     Needle Length: 9cm      Additional Needles:   Procedures:,,,, ultrasound used (permanent image in chart),,    Narrative:  Start time: 03/16/2022 7:56 AM End time: 03/16/2022 8:16 AM Injection made incrementally with aspirations every 5 mL.  Performed by: Personally  Anesthesiologist: Nolon Nations, MD  Additional Notes: BP cuff, SpO2 and EKG monitors applied. Sedation begun.  Anesthetic injected incrementally, slowly, and after neg aspirations under direct ultrasound guidance. Good fascial spread noted. Patient tolerated well.

## 2022-03-16 NOTE — Progress Notes (Signed)
Assisted Dr. Germeroth with left, pectoralis, ultrasound guided block. Side rails up, monitors on throughout procedure. See vital signs in flow sheet. Tolerated Procedure well. 

## 2022-03-16 NOTE — Anesthesia Postprocedure Evaluation (Signed)
Anesthesia Post Note  Patient: Julie Pugh  Procedure(s) Performed: LEFT BREAST LUMPECTOMY WITH RADIOACTIVE SEED AND AXILLARY SENTINEL LYMPH NODE BIOPSY (Left: Breast)     Patient location during evaluation: PACU Anesthesia Type: General Level of consciousness: sedated and patient cooperative Pain management: pain level controlled Vital Signs Assessment: post-procedure vital signs reviewed and stable Respiratory status: spontaneous breathing Cardiovascular status: stable Anesthetic complications: no   No notable events documented.  Last Vitals:  Vitals:   03/16/22 1000 03/16/22 1015  BP: 114/60 128/72  Pulse: 60 62  Resp: 16 18  Temp:    SpO2: 100% 100%    Last Pain:  Vitals:   03/16/22 1030  TempSrc:   PainSc: 0-No pain                 Nolon Nations

## 2022-03-16 NOTE — Discharge Instructions (Addendum)
[place] Office Phone Number [phone]  BREAST BIOPSY/ PARTIAL MASTECTOMY: POST OP INSTRUCTIONS Take 400 mg of ibuprofen every 8 hours or 650 mg tylenol every 6 hours for next 72 hours then as needed. Use ice several times daily also.  A prescription for pain medication may be given to you upon discharge.  Take your pain medication as prescribed, if needed.  If narcotic pain medicine is not needed, then you may take acetaminophen (Tylenol), naprosyn (Alleve) or ibuprofen (Advil) as needed. Take your usually prescribed medications unless otherwise directed If you need a refill on your pain medication, please contact your pharmacy.  They will contact our office to request authorization.  Prescriptions will not be filled after 5pm or on week-ends. You should eat very light the first 24 hours after surgery, such as soup, crackers, pudding, etc.  Resume your normal diet the day after surgery. Most patients will experience some swelling and bruising in the breast.  Ice packs and a good support bra will help.  Wear the breast binder provided or a sports bra for 72 hours day and night.  After that wear a sports bra during the day until you return to the office. Swelling and bruising can take several days to resolve.  It is common to experience some constipation if taking pain medication after surgery.  Increasing fluid intake and taking a stool softener will usually help or prevent this problem from occurring.  A mild laxative (Milk of Magnesia or Miralax) should be taken according to package directions if there are no bowel movements after 48 hours. I used skin glue on the incision, you may shower in 24 hours.  The glue will flake off over the next 2-3 weeks.  Any sutures or staples will be removed at the office during your follow-up visit. ACTIVITIES:  You may resume regular daily activities (gradually increasing) beginning the next day.  Do not lift arm above shoulder for first five  days and do not lift more than 15 lbs until office visit.  You may drive when you no longer are taking prescription pain medication, you can comfortably wear a seatbelt, and you can safely maneuver your car and apply brakes. RETURN TO WORK:  ______________________________________________________________________________________ Dennis Bast should see your doctor in the office for a follow-up appointment approximately 2-3 weeks after your surgery.  Your doctor's nurse will typically make your follow-up appointment when she calls you with your pathology report.  Expect your pathology report 3-4 business days after your surgery.  You may call to check if you do not hear from [place] after three days. OTHER INSTRUCTIONS: _______________________________________________________________________________________________ _____________________________________________________________________________________________________________________________________ _____________________________________________________________________________________________________________________________________ _____________________________________________________________________________________________________________________________________  WHEN TO CALL DR Kennadi [place]: Fever over 101.0 Nausea and/or vomiting. Extreme swelling or bruising. Continued bleeding from incision. Increased pain, redness, or drainage from the incision.  The clinic staff is available to answer your questions during regular business hours.  Please don't hesitate to call and ask to speak to one of the nurses for clinical concerns.  If you have a medical emergency, go to the nearest emergency room or call 911.  A surgeon from [facility] is always on call at the hospital.  For further questions, please visit [email or web address]   No Tylenol before 1pm 03/16/22   Post Anesthesia Home Care Instructions  Activity: Get plenty of rest for the remainder  of the day. A responsible individual must stay with you for 24 hours following the procedure.  For the next 24 hours, DO NOT: -Drive a car -  Operate machinery -Drink alcoholic beverages -Take any medication unless instructed by your physician -Make any legal decisions or sign important papers.  Meals: Start with liquid foods such as gelatin or soup. Progress to regular foods as tolerated. Avoid greasy, spicy, heavy foods. If nausea and/or vomiting occur, drink only clear liquids until the nausea and/or vomiting subsides. Call your physician if vomiting continues.  Special Instructions/Symptoms: Your throat may feel dry or sore from the anesthesia or the breathing tube placed in your throat during surgery. If this causes discomfort, gargle with warm salt water. The discomfort should disappear within 24 hours.  If you had a scopolamine patch placed behind your ear for the management of post- operative nausea and/or vomiting:  1. The medication in the patch is effective for 72 hours, after which it should be removed.  Wrap patch in a tissue and discard in the trash. Wash hands thoroughly with soap and water. 2. You may remove the patch earlier than 72 hours if you experience unpleasant side effects which may include dry mouth, dizziness or visual disturbances. 3. Avoid touching the patch. Wash your hands with soap and water after contact with the patch.

## 2022-03-16 NOTE — Anesthesia Procedure Notes (Signed)
Procedure Name: LMA Insertion Date/Time: 03/16/2022 8:47 AM Performed by: Ezequiel Kayser, CRNA Pre-anesthesia Checklist: Patient identified, Emergency Drugs available, Suction available and Patient being monitored Patient Re-evaluated:Patient Re-evaluated prior to induction Oxygen Delivery Method: Circle System Utilized Preoxygenation: Pre-oxygenation with 100% oxygen Induction Type: IV induction Ventilation: Mask ventilation without difficulty LMA: LMA inserted LMA Size: 4.0 Number of attempts: 1 Airway Equipment and Method: Bite block Placement Confirmation: positive ETCO2 Tube secured with: Tape Dental Injury: Teeth and Oropharynx as per pre-operative assessment

## 2022-03-16 NOTE — Op Note (Signed)
Preoperative diagnosis: Clinical stage I left breast cancer Postoperative diagnosis: Same as above Procedure: 1.  Left breast radioactive seed guided lumpectomy 2.  Left deep axillary sentinel lymph node biopsy 3.  Injection of mag trace for sentinel lymph node identification Surgeon: Dr. Serita Grammes Anesthesia: General with a pectoral block Estimated blood loss: Minimal Complications: None Drains: None Specimens: 1.  Left breast lumpectomy containing seed and clip marked with paint 2.  Left deep axillary sentinel lymph nodes with highest count of 8999 3.  Left breast inferior, posterior and lateral margins marked short superior, long lateral and double deep Sponge needle count was correct at completion Decision to recovery stable condition  Indications: 66 y.o. female is an otherwise healthy female with a history of a lower extremity melanoma. She has B density breast. She had no mass or discharge and underwent a screening mammogram that showed an upper outer quadrant left asymmetry less than 1 cm. On ultrasound this was a 6 x 5 x 5 mm lesion. There is no associated lymphadenopathy. 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 We elected to proceed with lumpectomy and sn biopsy.   Procedure: She first had a seed placed by radiology.  I had these mammograms available for my review.  After informed consent was obtained she then underwent a pectoral block. I also prepped the area lateral to the areola.  I injected 2 cc of mag trace in the subareolar position without complication.  She was given antibiotics.  SCDs were placed.  She was then placed under general anesthesia without complication.  She was prepped and draped in the standard sterile surgical fashion.  A surgical timeout was then performed.  I identified the seed in the upper outer quadrant. I made a periareolar incision to hide the scar later.  I then  dissected to the seed. I removed the seed and the surrounding tissue with an attempt to get a clear margin. Mammogram confirmed removal of the seed and the clip.  I removed additional margins as above. The posterior margin is the muscle. Hemostasis was observed. I closed this with 2-0 vicryl for the breast tissue. I did place clips in the cavity. I closed the skin with 3-0 vicryl and 5-0 monocryl. Glue and sterstrips were applied.   I made a curvilinear incision below the axillary hairline. I then entered the axilla.  I used the probe to identify what appeared to be a couple normal-appearing nodes that contained magtrace and were brown stained.  There was no other activity once I was completed this.  These were then passed off the table.  Hemostasis was observed. I closed the axillary fascia with 2-0 vicryl.  I then closed skin with 3-0 vicryl and 4-0 monocryl. Glue was placed.   she tolerated well, was extubated and transferred to recovery stable

## 2022-03-17 ENCOUNTER — Encounter (HOSPITAL_BASED_OUTPATIENT_CLINIC_OR_DEPARTMENT_OTHER): Payer: Self-pay | Admitting: General Surgery

## 2022-03-18 ENCOUNTER — Encounter: Payer: Self-pay | Admitting: *Deleted

## 2022-03-18 LAB — SURGICAL PATHOLOGY

## 2022-03-18 NOTE — Addendum Note (Signed)
Addendum  created 03/18/22 1133 by Ezequiel Kayser, CRNA   Charge Capture section accepted

## 2022-03-19 DIAGNOSIS — C50412 Malignant neoplasm of upper-outer quadrant of left female breast: Secondary | ICD-10-CM | POA: Diagnosis not present

## 2022-03-29 ENCOUNTER — Inpatient Hospital Stay: Payer: Medicare Other | Attending: Hematology and Oncology | Admitting: Hematology and Oncology

## 2022-03-29 ENCOUNTER — Telehealth: Payer: Self-pay

## 2022-03-29 ENCOUNTER — Other Ambulatory Visit: Payer: Self-pay

## 2022-03-29 VITALS — BP 156/70 | HR 72 | Temp 97.7°F | Resp 18 | Ht 67.0 in | Wt 154.9 lb

## 2022-03-29 DIAGNOSIS — Z5112 Encounter for antineoplastic immunotherapy: Secondary | ICD-10-CM | POA: Insufficient documentation

## 2022-03-29 DIAGNOSIS — Z170 Estrogen receptor positive status [ER+]: Secondary | ICD-10-CM | POA: Insufficient documentation

## 2022-03-29 DIAGNOSIS — C50412 Malignant neoplasm of upper-outer quadrant of left female breast: Secondary | ICD-10-CM | POA: Diagnosis not present

## 2022-03-29 MED ORDER — LETROZOLE 2.5 MG PO TABS: 2.5000 mg | ORAL_TABLET | Freq: Every day | ORAL | 3 refills | Status: DC

## 2022-03-29 NOTE — Assessment & Plan Note (Addendum)
02/12/2022:Screening mammogram detected left breast mass, by ultrasound measured 0.6 cm, biopsy revealed grade 2 IDC ER 100%, PR 2%, HER2 equivocal by IHC and FISH positive, Ki-67 30%   Treatment plan: 1.  03/16/2022 Left lumpectomy: Grade 3 IDC 5 mm (6 mm on biopsy), negative for lymphovascular invasion, ER 100%, PR 2%, HER2 FISH positive, margins negative, 0/2 lymph nodes negative 2.  based on only 6 mm tumor size, we discussed the pros and cons of systemic treatments and decided not to pursue chemotherapy or anti-HER2 therapy. 3.  Adjuvant radiation 4.  Followed by adjuvant antiestrogen therapy ---------------------------------------------------------------------------- Pathology counseling: I discussed the final pathology report of the patient provided  a copy of this report. I discussed the margins as well as lymph node surgeries. We also discussed the final staging along with previously performed ER/PR and HER-2/neu testing.  Return to clinic after radiation to start antiestrogens

## 2022-03-29 NOTE — Telephone Encounter (Signed)
Called pt to advise she has been scheduled for echo per MD 04/05/22 at 0900 with an 0845 arrival. LVM for pt to call back .

## 2022-03-29 NOTE — Progress Notes (Signed)
START OFF PATHWAY REGIMEN - Breast   [id number]:Trastuzumab and hyaluronidase-oysk 600 mg/10,000 units SUBQ D1 q21 Days:   A cycle is every 21 days:     Trastuzumab and hyaluronidase-oysk   **Always confirm dose/schedule in your pharmacy ordering system**  Patient Characteristics: Postoperative without Neoadjuvant Therapy (Pathologic Staging), Invasive Disease, Adjuvant Therapy, HER2 Positive, ER Positive, Node Negative, pT1b, pN0/N32m, Chemotherapy Indicated Therapeutic Status: Postoperative without Neoadjuvant Therapy (Pathologic Staging) AJCC Grade: G3 AJCC N Category: pN0 AJCC M Category: cM0 ER Status: Positive (+) AJCC 8 Stage Grouping: IA HER2 Status: Positive (+) Oncotype Dx Recurrence Score: Not Appropriate AJCC T Category: pT1b PR Status: Negative (-) Adjuvant Therapy Status: No Adjuvant Therapy Received Yet or Changing Initial Adjuvant Regimen due to Tolerance Intervention Indicated: Chemotherapy Intent of Therapy: Curative Intent, Discussed with Patient

## 2022-03-30 ENCOUNTER — Telehealth: Payer: Self-pay | Admitting: Hematology and Oncology

## 2022-03-30 ENCOUNTER — Encounter: Payer: Self-pay | Admitting: *Deleted

## 2022-03-30 NOTE — Telephone Encounter (Signed)
Scheduled appointment per 6/19 los. Patient is aware.

## 2022-03-31 ENCOUNTER — Inpatient Hospital Stay (HOSPITAL_BASED_OUTPATIENT_CLINIC_OR_DEPARTMENT_OTHER): Payer: Medicare Other

## 2022-03-31 ENCOUNTER — Other Ambulatory Visit: Payer: Self-pay | Admitting: Hematology and Oncology

## 2022-03-31 ENCOUNTER — Other Ambulatory Visit: Payer: Self-pay

## 2022-03-31 NOTE — Progress Notes (Signed)
Pharmacist Chemotherapy Monitoring - Initial Assessment    Anticipated start date: 04/07/22   The following has been reviewed per standard work regarding the patient's treatment regimen: The patient's diagnosis, treatment plan and drug doses, and organ/hematologic function Lab orders and baseline tests specific to treatment regimen  The treatment plan start date, drug sequencing, and pre-medications Prior authorization status  Patient's documented medication list, including drug-drug interaction screen and prescriptions for anti-emetics and supportive care specific to the treatment regimen The drug concentrations, fluid compatibility, administration routes, and timing of the medications to be used The patient's access for treatment and lifetime cumulative dose history, if applicable  The patient's medication allergies and previous infusion related reactions, if applicable   Changes made to treatment plan:  N/A  Follow up needed:  Pending authorization for treatment  Please update allergy status in epic ECHO scheduled for 04/05/22  Julie Pugh, RPH, BCPS, BCOP 03/31/2022  11:56 AM

## 2022-04-01 ENCOUNTER — Encounter: Payer: Self-pay | Admitting: *Deleted

## 2022-04-05 ENCOUNTER — Ambulatory Visit (HOSPITAL_COMMUNITY)
Admission: RE | Admit: 2022-04-05 | Discharge: 2022-04-05 | Disposition: A | Discharge status: Home / Self Care | Payer: Medicare Other | Source: Ambulatory Visit | Attending: Hematology and Oncology | Admitting: Hematology and Oncology

## 2022-04-05 DIAGNOSIS — C50412 Malignant neoplasm of upper-outer quadrant of left female breast: Secondary | ICD-10-CM | POA: Insufficient documentation

## 2022-04-05 DIAGNOSIS — Z170 Estrogen receptor positive status [ER+]: Secondary | ICD-10-CM | POA: Diagnosis not present

## 2022-04-05 DIAGNOSIS — I081 Rheumatic disorders of both mitral and tricuspid valves: Secondary | ICD-10-CM | POA: Insufficient documentation

## 2022-04-05 DIAGNOSIS — Z0189 Encounter for other specified special examinations: Secondary | ICD-10-CM | POA: Diagnosis not present

## 2022-04-05 DIAGNOSIS — E785 Hyperlipidemia, unspecified: Secondary | ICD-10-CM | POA: Insufficient documentation

## 2022-04-05 DIAGNOSIS — Z5111 Encounter for antineoplastic chemotherapy: Secondary | ICD-10-CM | POA: Diagnosis not present

## 2022-04-05 LAB — ECHOCARDIOGRAM COMPLETE
Area-P 1/2: 2.32 cm2
Calc EF: 58.5 %
S' Lateral: 2.7 cm
Single Plane A2C EF: 58.1 %
Single Plane A4C EF: 55.3 %

## 2022-04-06 ENCOUNTER — Encounter: Payer: Self-pay | Admitting: Hematology and Oncology

## 2022-04-06 NOTE — Progress Notes (Signed)
Called pt to introduce myself as her Dance movement psychotherapist.  Unfortunately there aren't any foundations offering copay assistance for her Dx and the type of ins she has.  I offered the [employer] and went over what it covers but pt declined the grant at this time but asked for my card and info on the grant so I will request for the registration staff give her my card and a grant reference sheet.

## 2022-04-07 ENCOUNTER — Inpatient Hospital Stay: Payer: Medicare Other

## 2022-04-07 ENCOUNTER — Other Ambulatory Visit: Payer: Self-pay

## 2022-04-07 VITALS — BP 156/81 | HR 66 | Temp 98.2°F | Resp 17 | Wt 152.5 lb

## 2022-04-07 DIAGNOSIS — C50412 Malignant neoplasm of upper-outer quadrant of left female breast: Secondary | ICD-10-CM

## 2022-04-07 DIAGNOSIS — Z5112 Encounter for antineoplastic immunotherapy: Secondary | ICD-10-CM | POA: Diagnosis not present

## 2022-04-07 DIAGNOSIS — Z170 Estrogen receptor positive status [ER+]: Secondary | ICD-10-CM | POA: Diagnosis not present

## 2022-04-07 MED ORDER — TRASTUZUMAB-HYALURONIDASE-OYSK 600-10000 MG-UNT/5ML ~~LOC~~ SOLN
600.0000 mg | Freq: Once | SUBCUTANEOUS | Status: AC
  Administered 2022-04-07: 600 mg via SUBCUTANEOUS
  Filled 2022-04-07: qty 5

## 2022-04-07 MED ORDER — ACETAMINOPHEN 325 MG PO TABS
650.0000 mg | ORAL_TABLET | Freq: Once | ORAL | Status: AC
  Administered 2022-04-07: 650 mg via ORAL
  Filled 2022-04-07: qty 2

## 2022-04-07 MED ORDER — DIPHENHYDRAMINE HCL 25 MG PO CAPS
25.0000 mg | ORAL_CAPSULE | Freq: Once | ORAL | Status: AC
  Administered 2022-04-07: 25 mg via ORAL
  Filled 2022-04-07: qty 1

## 2022-04-07 NOTE — Patient Instructions (Addendum)
[facility]  Discharge Instructions: Thank you for choosing [facility] to provide your oncology and hematology care.   If you have a lab appointment with the [facility], please go directly to the [facility] and check in at the registration area.   Wear comfortable clothing and clothing appropriate for easy access to any Portacath or PICC line.   We strive to give you quality time with your provider. You may need to reschedule your appointment if you arrive late (15 or more minutes).  Arriving late affects you and other patients whose appointments are after yours.  Also, if you miss three or more appointments without notifying the office, you may be dismissed from the clinic at the provider's discretion.      For prescription refill requests, have your pharmacy contact our office and allow 72 hours for refills to be completed.    Today you received the following chemotherapy and/or immunotherapy agents   TRASTUZUMAB; HYALURONIDASE    To help prevent nausea and vomiting after your treatment, we encourage you to take your nausea medication as directed.  BELOW ARE SYMPTOMS THAT SHOULD BE REPORTED IMMEDIATELY: *FEVER GREATER THAN 100.4 F (38 C) OR HIGHER *CHILLS OR SWEATING *NAUSEA AND VOMITING THAT IS NOT CONTROLLED WITH YOUR NAUSEA MEDICATION *UNUSUAL SHORTNESS OF BREATH *UNUSUAL BRUISING OR BLEEDING *URINARY PROBLEMS (pain or burning when urinating, or frequent urination) *BOWEL PROBLEMS (unusual diarrhea, constipation, pain near the anus) TENDERNESS IN MOUTH AND THROAT WITH OR WITHOUT PRESENCE OF ULCERS (sore throat, sores in mouth, or a toothache) UNUSUAL RASH, SWELLING OR PAIN  UNUSUAL VAGINAL DISCHARGE OR ITCHING   Items with * indicate a potential emergency and should be followed up as soon as possible or go to the Emergency Department if any problems should occur.  Please show the CHEMOTHERAPY ALERT CARD or IMMUNOTHERAPY ALERT CARD  at check-in to the Emergency Department and triage nurse.  Should you have questions after your visit or need to cancel or reschedule your appointment, please contact [facility]  Dept: [phone]  and follow the prompts.  Office hours are 8:00 a.m. to 4:30 p.m. Monday - Friday. Please note that voicemails left after 4:00 p.m. may not be returned until the following business day.  We are closed weekends and major holidays. You have access to a nurse at all times for urgent questions. Please call the main number to the clinic Dept: [phone] and follow the prompts.   For any non-urgent questions, you may also contact your provider using MyChart. We now offer e-Visits for anyone 29 and older to request care online for non-urgent symptoms. For details visit [email or web address].   Also download the MyChart app! Go to the app store, search "MyChart", open the app, select [facility], and log in with your MyChart username and password.  Masks are optional in the cancer centers. If you would like for your care team to wear a mask while they are taking care of you, please let them know. For doctor visits, patients may have with them one support person who is at least 68 years old. At this time, visitors are not allowed in the infusion area.Trastuzumab; Hyaluronidase injection What is this medication?   TRASTUZUMAB; HYALURONIDASE (tras TOO zoo mab / hye al ur ON i dase) is used to treat breast cancer and stomach cancer. Trastuzumab is a monoclonal antibody. Hyaluronidase is used to improve the effects of trastuzumab. This medicine may be used for  other purposes; ask your health care provider or pharmacist if you have questions. COMMON BRAND NAME(S): HERCEPTIN HYLECTA What should I tell my care team before I take this medication? They need to know if you have any of these conditions: heart disease heart failure lung or breathing disease, like asthma an unusual or  allergic reaction to trastuzumab, or other medications, foods, dyes, or preservatives pregnant or trying to get pregnant breast-feeding How should I use this medication? This medicine is for injection under the skin. It is given by a health care professional in a hospital or clinic setting. Talk to your pediatrician regarding the use of this medicine in children. This medicine is not approved for use in children. Overdosage: If you think you have taken too much of this medicine contact a poison control center or emergency room at once. NOTE: This medicine is only for you. Do not share this medicine with others. What if I miss a dose? It is important not to miss a dose. Call your doctor or health care professional if you are unable to keep an appointment. What may interact with this medication? This medicine may interact with the following medications: certain types of chemotherapy, such as daunorubicin, doxorubicin, epirubicin, and idarubicin This list may not describe all possible interactions. Give your health care provider a list of all the medicines, herbs, non-prescription drugs, or dietary supplements you use. Also tell them if you smoke, drink alcohol, or use illegal drugs. Some items may interact with your medicine. What should I watch for while using this medication? Visit your doctor for checks on your progress. Report any side effects. Continue your course of treatment even though you feel ill unless your doctor tells you to stop. Call your doctor or health care professional for advice if you get a fever, chills or sore throat, or other symptoms of a cold or flu. Do not treat yourself. Try to avoid being around people who are sick. You may experience fever, chills and shaking during your first infusion. These effects are usually mild and can be treated with other medicines. Report any side effects during the infusion to your health care professional. Fever and chills usually do not happen  with later infusions. Do not become pregnant while taking this medicine or for 7 months after stopping it. Women should inform their doctor if they wish to become pregnant or think they might be pregnant. Women of child-bearing potential will need to have a negative pregnancy test before starting this medicine. There is a potential for serious side effects to an unborn child. Talk to your health care professional or pharmacist for more information. Do not breast-feed an infant while taking this medicine or for 7 months after stopping it. What side effects may I notice from receiving this medication? Side effects that you should report to your doctor or health care professional as soon as possible: allergic reactions like skin rash, itching or hives, swelling of the face, lips, or tongue breathing problems chest pain or palpitations cough fever general ill feeling or flu-like symptoms signs of worsening heart failure like breathing problems; swelling in your legs and feet Side effects that usually do not require medical attention (report these to your doctor or health care professional if they continue or are bothersome): bone pain changes in taste diarrhea joint pain nausea/vomiting unusually weak or tired weight loss This list may not describe all possible side effects. Call your doctor for medical advice about side effects. You may report side effects  to FDA at 1-800-FDA-1088. Where should I keep my medication? This drug is given in a hospital or clinic and will not be stored at home. NOTE: This sheet is a summary. It may not cover all possible information. If you have questions about this medicine, talk to your doctor, pharmacist, or health care provider.  2023 Elsevier/Gold Standard (2018-02-13 00:00:00)

## 2022-04-08 ENCOUNTER — Encounter: Payer: Self-pay | Admitting: Physical Therapy

## 2022-04-08 ENCOUNTER — Encounter: Payer: Self-pay | Admitting: *Deleted

## 2022-04-08 ENCOUNTER — Ambulatory Visit: Payer: Medicare Other | Attending: General Surgery | Admitting: Physical Therapy

## 2022-04-08 DIAGNOSIS — Z170 Estrogen receptor positive status [ER+]: Secondary | ICD-10-CM | POA: Diagnosis not present

## 2022-04-08 DIAGNOSIS — C50412 Malignant neoplasm of upper-outer quadrant of left female breast: Secondary | ICD-10-CM | POA: Insufficient documentation

## 2022-04-08 DIAGNOSIS — R293 Abnormal posture: Secondary | ICD-10-CM | POA: Insufficient documentation

## 2022-04-08 NOTE — Therapy (Signed)
OUTPATIENT PHYSICAL THERAPY BREAST CANCER POST OP FOLLOW UP   Patient Name: Julie Pugh MRN: [id number] DOB:1955/12/23, 66 y.o., female Today's Date: 04/08/2022   PT End of Session - 04/08/22 1112     Visit Number 2    Number of Visits 2    Date for PT Re-Evaluation 04/12/22    PT Start Time 1111   pt arrived late   PT Stop Time 1130    PT Time Calculation (min) 19 min    Activity Tolerance Patient tolerated treatment well    Behavior During Therapy WFL for tasks assessed/performed             Past Medical History:  Diagnosis Date   Hx of melanoma of skin    Hyperlipidemia    Hypothyroidism    Dr. Jaynie Collins managing. armour thyroid 72m    [place]    R [place] 2009. sees derm q6 months   Varicose vein    Past Surgical History:  Procedure Laterality Date   BREAST BIOPSY     benign 2000-stereotactic biopsy   BREAST BIOPSY Right 03/12/2004   BREAST LUMPECTOMY WITH RADIOACTIVE SEED AND SENTINEL LYMPH NODE BIOPSY Left 03/16/2022   Procedure: LEFT BREAST LUMPECTOMY WITH RADIOACTIVE SEED AND AXILLARY SENTINEL LYMPH NODE BIOPSY;  Surgeon: WRolm Bookbinder MD;  [facility]  Service: General;  Laterality: Left;   [facility]    2001   Patient Active Problem List   Diagnosis Date Noted   Malignant neoplasm of upper-outer quadrant of left breast in female, estrogen receptor positive ([facility] 02/12/2022   History of COVID-19 06/12/2020   Visit for preventive health examination 09/28/2017   Vitamin D deficiency 05/26/2017   Hyperlipidemia 07/10/2015   Hypothyroidism    History of melanoma    Varicose vein     PPCP: KAnnye Asa MD   REFERRING PROVIDER: MRolm Bookbinder MD   REFERRING DIAG: C50.412,Z17.0 (ICD-10-CM) - Malignant neoplasm of upper-outer quadrant of left breast in female, estrogen receptor positive ([facility]   THERAPY DIAG:  Abnormal posture  Malignant neoplasm of upper-outer quadrant of left breast in female,  estrogen receptor positive ([facility]  Rationale for Evaluation and Treatment Rehabilitation  ONSET DATE: 02/08/22  SUBJECTIVE:                                                                                                                                                                                           SUBJECTIVE STATEMENT: I am doing really well. I have not tried tennis yet but I played pickle ball and that was fine.   PERTINENT HISTORY:  Patient was diagnosed  on 02/08/22 with left grade 2. It measures less than 1 cm and is located in the upper outer quadrant. It is ER/PR+ (HER2 equivocal - waiting on results) with a Ki67 of 30%. L Lumpectomy and SLNB (0/2) on 03/16/22  PATIENT GOALS:  Reassess how my recovery is going related to arm function, pain, and swelling.  PAIN:  Are you having pain? No  PRECAUTIONS: Recent Surgery, left UE Lymphedema risk,   ACTIVITY LEVEL / LEISURE: pt is walking and playing pickle ball several times per week   OBJECTIVE:   PATIENT SURVEYS:  QUICK DASH:  Quick Dash - 04/08/22 0001     Open a tight or new jar No difficulty    Do heavy household chores (wash walls, wash floors) No difficulty    Carry a shopping bag or briefcase No difficulty    Wash your back No difficulty    Use a knife to cut food No difficulty    Recreational activities in which you take some force or impact through your arm, shoulder, or hand (golf, hammering, tennis) No difficulty    During the past week, to what extent has your arm, shoulder or hand problem interfered with your normal social activities with family, friends, neighbors, or groups? Not at all    During the past week, to what extent has your arm, shoulder or hand problem limited your work or other regular daily activities Not at all    Arm, shoulder, or hand pain. None    Tingling (pins and needles) in your arm, shoulder, or hand None    Difficulty Sleeping No difficulty    DASH Score 0 %               OBSERVATIONS:  Healing lumpectomy and SLNB scars. Some scar tissue present superior to lumpectomy scar. Mild bruising still present.     POSTURE:  Forward head and rounded shoulders posture   UPPER EXTREMITY AROM/PROM:   A/PROM RIGHT  02/15/2022    Shoulder extension 80  Shoulder flexion 150  Shoulder abduction 161  Shoulder internal rotation 59  Shoulder external rotation 82                          (Blank rows = not tested)   A/PROM LEFT  02/15/2022 LEFT 04/08/22  Shoulder extension 67 71  Shoulder flexion 162 166  Shoulder abduction 167 171  Shoulder internal rotation 58 52  Shoulder external rotation 66 74                          (Blank rows = not tested)     CERVICAL AROM: All within normal limits:      Percent limited  Flexion WFL  Extension WFL  Right lateral flexion WFL  Left lateral flexion WFL  Right rotation WFL  Left rotation WFL        UPPER EXTREMITY STRENGTH: 5/5     LYMPHEDEMA ASSESSMENTS:    LANDMARK RIGHT  02/15/2022  10 cm proximal to olecranon process 28.4  Olecranon process 26  10 cm proximal to ulnar styloid process 21.5  Just proximal to ulnar styloid process 16.7  Across hand at thumb web space 20.5  At base of 2nd digit 6.3  (Blank rows = not tested)   LANDMARK LEFT  02/15/2022 LEFT 04/08/22  10 cm proximal to olecranon process 27.2 27  Olecranon process 25.3 25.3  10 cm proximal to  ulnar styloid process 20.3 20.1  Just proximal to ulnar styloid process 15.4 15.5  Across hand at thumb web space 19.4 20.2  At base of 2nd digit 6.5 6.4  (Blank rows = not tested)     Surgery type/Date: 03/16/22 L breast lumpectomy and SLNB  Number of lymph nodes removed: 0/2 Current/past treatment (chemo, radiation, hormone therapy): pt requires radiation and will begin soon, she is taking letrozole Other symptoms:  Heaviness/tightness No Pain No Pitting edema No Infections No Decreased scar mobility Yes scar is healing, instructed pt in scar  mobilization at 6 weeks Stemmer sign No   PATIENT EDUCATION:  Education details: ABC class, signs of breast lymphedema, continue to wear compression bra until all swelling is gone, scar mobilization Person educated: Patient Education method: Explanation and Handouts Education comprehension: verbalized understanding   HOME EXERCISE PROGRAM:  Reviewed previously given post op HEP. Scar mobilization at 6 weeks  ASSESSMENT:  CLINICAL IMPRESSION: Pt returns to PT after undergoing a L breast lumpectomy and SLNB on 03/16/22 with 2 lymph nodes removed - both negative for treatment of L breast cancer. She will require radiation and is on hormone therapy. She has returned to baseline shoulder ROM and has also started to play pickle ball again. She is having no limitations. Educated pt about importance of scar mobilization after 6 weeks and attending the ABC class which pt is signed up to take on Monday. Pt will be discharged from skilled PT services at this time but will continue l dex screenings for subclinical lymphedema every 3 months for the first 2 years post op.   Pt will benefit from skilled therapeutic intervention to improve on the following deficits: Decreased knowledge of precautions, impaired UE functional use, pain, decreased ROM, postural dysfunction.   PT treatment/interventions: ADL/Self care home management, Therapeutic exercises and Patient/Family education     GOALS: Goals reviewed with patient? Yes  LONG TERM GOALS:  (STG=LTG)  GOALS Name Target Date  Goal status  1 Pt will demonstrate she has regained full shoulder ROM and function post operatively compared to baselines.  Baseline: 04/08/22  Met     PLAN: PT FREQUENCY/DURATION: d/c this session  PLAN FOR NEXT SESSION: cont L dex screenings every 3 months for 2 years post op (03/16/22)   [facility]  [address], [address]  [place] [address]  [phone]  After Breast Cancer  Class It is recommended you attend the ABC class to be educated on lymphedema risk reduction. This class is free of charge and lasts for 1 hour. It is a 1-time class. You will need to download the Webex app either on your phone or computer. We will send you a link the night before or the morning of the class. You should be able to click on that link to join the class. This is not a confidential class. You don't have to turn your camera on, but other participants may be able to see your email address.  Scar massage You can begin gentle scar massage to you incision sites at 6 weeks (July 18th). Gently place one hand on the incision and move the skin (without sliding on the skin) in various directions. Do this for a few minutes and then you can gently massage either coconut oil or vitamin E cream into the scars.  Compression garment You should continue wearing your compression bra until you feel like you no longer have swelling.  Home exercise Program Continue doing the exercises you  were given until you feel like you can do them without feeling any tightness at the end.   Walking Program Studies show that 30 minutes of walking per day (fast enough to elevate your heart rate) can significantly reduce the risk of a cancer recurrence. If you can't walk due to other medical reasons, we encourage you to find another activity you could do (like a stationary bike or water exercise).  Posture After breast cancer surgery, people frequently sit with rounded shoulders posture because it puts their incisions on slack and feels better. If you sit like this and scar tissue forms in that position, you can become very tight and have pain sitting or standing with good posture. Try to be aware of your posture and sit and stand up tall to heal properly.  Follow up PT: It is recommended you return every 3 months for the first 3 years following surgery to be assessed on the SOZO machine for an L-Dex score. This helps  prevent clinically significant lymphedema in 95% of patients. These follow up screens are 10 minute appointments that you are not billed for.  [facility], PT 04/08/2022, 11:40 AM   PHYSICAL THERAPY DISCHARGE SUMMARY  Visits from Start of Care: 2  Current functional level related to goals / functional outcomes: All goals met   Remaining deficits: None   Education / Equipment: HEP, scar mobilization, ABC class   Patient agrees to discharge. Patient goals were met. Patient is being discharged due to meeting the stated rehab goals.  [facility] [place], [place] 04/08/22 11:40 AM

## 2022-04-09 ENCOUNTER — Encounter (HOSPITAL_COMMUNITY): Payer: Self-pay

## 2022-04-09 NOTE — Progress Notes (Signed)
..  Patient is receiving Replacement Medication. Medication: Herceptin Hylecta Manufacture: [employer] Approval Dates: Approved from 04/09/2022 until Indefinitely. ID: [id number] Reason: Insurance denial  First DOS: 04/07/2022

## 2022-04-14 ENCOUNTER — Telehealth: Payer: Self-pay | Admitting: Nutrition

## 2022-04-14 NOTE — Telephone Encounter (Signed)
Per 7/5 phone lines pt called to cancel appointment.  Pt said she wanted to cancel for now and would call back to reschedule when she knew her schedule

## 2022-04-15 ENCOUNTER — Inpatient Hospital Stay: Payer: Medicare Other | Admitting: Nutrition

## 2022-04-15 NOTE — Progress Notes (Signed)
Location of Breast Cancer:  Malignant neoplasm of upper-outer quadrant of left breast in female, estrogen receptor positive   Histology per Pathology Report:  03/16/2022 FINAL MICROSCOPIC DIAGNOSIS:  A. BREAST, LEFT, LUMPECTOMY:  -  Invasive ductal carcinoma, NOS (e-cadherin retained), residual tumor 5 mm microscopically (original biopsy, [id number], 6 mm).  -  Nottingham histologic grade 3 of 3 (3/3, 2/3, 1/3).  -  Negative for lymphovascular invasion.  -  Biomarkers (performed on [id number]): ER - positive 100%; PR - positive 2%; FISH positive by FISH. pT1b pN0 pM n/a  B. LYMPH NODE, LEFT AXILLA, SENTINEL, EXCISION:  -  One lymph node negative for malignancy (morphologically and immunohistochemically; AE1/AE3) (0/1).  C. LYMPH NODE, LEFT AXILLA, SENTINEL, EXCISION:  -  One lymph node negative for malignancy (morphologically and immunohistochemically; AE1/AE3) (0/1).  D. BREAST, LEFT POSTERIOR MARGIN, EXCISION:  -  Benign breast tissue, negative for malignancy.  E. BREAST, LEFT LATERAL MARGIN, EXCISION:  -  Benign breast tissue, negative for malignancy.  F. BREAST, LEFT INFERIOR MARGIN, EXCISION:  -  Benign breast tissue, negative for malignancy.  Receptor Status: ER(100%), PR (2%), Her2-neu (Positive via [place]), Ki-67(30%)  Past/Anticipated interventions by surgeon, if any:  04/08/2022 --Dr. Rolm Bookbinder (office visit) Physical Exam  Left breast periareolar and axillary incisions healing well without any infection Assessment and Plan:  She is doing well. She can return to full activity.  I will plan on seeing her in 1 year. Return in about 1 year (around 04/09/2023).  03/16/2022 --Dr. Rolm Bookbinder Left breast radioactive seed guided lumpectomy Left deep axillary sentinel lymph node biopsy Injection of mag trace for sentinel lymph node identification  Past/Anticipated interventions by medical oncology, if any:  Under care of Dr. Nicholas Lose 03/29/2022 --Treatment  plan: 03/16/2022 Left lumpectomy: Grade 3 IDC 5 mm (6 mm on biopsy), negative for lymphovascular invasion, ER 100%, PR 2%, HER2 FISH positive, margins negative, 0/2 lymph nodes negative Based on only 6 mm tumor size, we discussed the pros and cons of systemic treatments and decided to treat her with Herceptin with letrozole. Adjuvant radiation Followed by adjuvant antiestrogen therapy Plan to start Herceptin within next 1 to 2 weeks. Echocardiogram will be obtained next week. Return to clinic every 3 weeks for Herceptin injections and every 6 weeks for follow-up with me. She does not need any labs to be done with Herceptin.  Lymphedema issues, if any:  Patient denies, and reports her range of motion is better compared to her assessment prior to surgery    Pain issues, if any: Patient denies   SAFETY ISSUES: Prior radiation? No Pacemaker/ICD? No Possible current pregnancy? No--postmenopausal  Is the patient on methotrexate? No  Current Complaints / other details:  Has noticed that her BP has remained elevated since her surgery

## 2022-04-15 NOTE — Progress Notes (Signed)
Radiation Oncology         [phone] ________________________________  Name: Julie Pugh MRN: [id number]  Date: 04/16/2022  DOB: 12/20/1955  Follow-Up Visit Note  Outpatient  CC: [name], MD  Nicholas Lose, MD  Diagnosis:      ICD-10-CM   1. Malignant neoplasm of upper-outer quadrant of left breast in female, estrogen receptor positive ([place])  C50.412    Z17.0        Left Breast UOQ, Invasive Ductal Carcinoma, ER+ / PR+ (weakly positive at 2%) / Her2+, Grade 2   Cancer Staging  Malignant neoplasm of upper-outer quadrant of left breast in female, estrogen receptor positive ([place]) Staging form: Breast, AJCC 8th Edition - Clinical: Stage IA (cT1b, cN0, cM0, G2, ER+, PR-, HER2+) - Signed by Nicholas Lose, MD on 02/17/2022  pT1b, pN0  CHIEF COMPLAINT: Here to discuss management of left breast cancer  Narrative:  The patient returns today for follow-up.     Since consultation date of 02/19/22, the patient opted to proceed with left breast lumpectomy with nodal biopsies on 03/16/22 under the care of Dr. Donne Hazel. Pathology from the procedure revealed: residual tumor size of 5 mm; histology of grade 2 invasive ductal carcinoma (negative for LVI); all margins negative for carcinoma; margin status to invasive disease of 8 mm from the superior margin; nodal status of 2/2 left axillary sentinel lymph nodes negative for malignancy;  ER status: 100% positive; PR status 2% positive (both with strong staining intensity); Proliferation marker Ki67 at 30%; Her2 status positive; Grade 2.  The patient began chemotherapy consisting of Herceptin on 04/07/22 (Dr. Lindi Adie). Treatment will be every 21 days. Following completion of XRT, the patient will begin antiestrogen therapy consisting of letrozole.   (Per her most recent follow-up with Dr. Donne Hazel on 04/08/22, physical exam showed no signs of infection or seroma, and the patient denied any post-op issues).   Symptomatically, the  patient reports: Doing well.  Just won a Dispensing optician.  Tolerating Herceptin well        ALLERGIES:  has No Known Allergies.  Meds: Current Outpatient Medications  Medication Sig Dispense Refill   Bioflavonoid Products (ESTER-C) 446-286-38 MG TABS Take by mouth.     Cholecalciferol (VITAMIN D) 2000 units tablet Take 5,000 Units by mouth daily.     letrozole (FEMARA) 2.5 MG tablet Take 1 tablet (2.5 mg total) by mouth daily. 90 tablet 3   levothyroxine (SYNTHROID) 100 MCG tablet TAKE 1 TABLET(100 MCG) BY MOUTH DAILY 90 tablet 3   METRONIDAZOLE, TOPICAL, 0.75 % LOTN APP TO FACE AT NIGHT UTD     Multiple Vitamins-Minerals (CENTRUM SILVER 50+WOMEN PO) Take by mouth.     No current facility-administered medications for this encounter.    Physical Findings:  height is 5' 7"  (1.702 m) and weight is 154 lb 2 oz (69.9 kg). Her oral temperature is 98.8 F (37.1 C). Her blood pressure is 171/77 (abnormal) and her pulse is 62. Her respiration is 18 and oxygen saturation is 100%. .     General: Alert and oriented, in no acute distress Musculoskeletal: symmetric strength and muscle tone throughout. Neurologic: No obvious focalities. Speech is fluent.  Psychiatric: Judgment and insight are intact. Affect is appropriate. Breast exam reveals satisfactory healing of left breast status post breast conserving surgery  Lab Findings: Lab Results  Component Value Date   WBC 5.9 10/22/2021   HGB 14.5 10/22/2021   HCT 44.1 10/22/2021   MCV 90.0 10/22/2021  PLT 297.0 10/22/2021    @LASTCHEMISTRY @  Radiographic Findings: ECHOCARDIOGRAM COMPLETE  Result Date: 04/05/2022    ECHOCARDIOGRAM REPORT   Patient Name:   Julie Pugh Date of Exam: 04/05/2022 Medical Rec #:  [id number]        Height:       67.0 in Accession #:    [id number]       Weight:       154.9 lb Date of Birth:  1955-12-10        BSA:          1.814 m Patient Age:    1 years         BP:           162/71 mmHg Patient Gender: F                 HR:           61 bpm. Exam Location:  Outpatient Procedure: 2D Echo, Cardiac Doppler, Color Doppler and Strain Analysis Indications:    Chemo Z09  History:        Patient has no prior history of Echocardiogram examinations.                 Risk Factors:Dyslipidemia.  Sonographer:    Bernadene Person RDCS Referring Phys: [id number] Nicholas Lose IMPRESSIONS  1. Left ventricular ejection fraction, by estimation, is 60 to 65%. The left ventricle has normal function. The left ventricle has no regional wall motion abnormalities. Left ventricular diastolic parameters were normal. The average left ventricular global longitudinal strain is -20.6 %. The global longitudinal strain is normal.  2. Right ventricular systolic function is normal. The right ventricular size is normal. Tricuspid regurgitation signal is inadequate for assessing PA pressure.  3. The mitral valve is normal in structure. Mild mitral valve regurgitation. No evidence of mitral stenosis.  4. The aortic valve is tricuspid. Aortic valve regurgitation is not visualized. No aortic stenosis is present.  5. The inferior vena cava is normal in size with greater than 50% respiratory variability, suggesting right atrial pressure of 3 mmHg. FINDINGS  Left Ventricle: Left ventricular ejection fraction, by estimation, is 60 to 65%. The left ventricle has normal function. The left ventricle has no regional wall motion abnormalities. The average left ventricular global longitudinal strain is -20.6 %. The global longitudinal strain is normal. The left ventricular internal cavity size was normal in size. There is no left ventricular hypertrophy. Left ventricular diastolic parameters were normal. Right Ventricle: The right ventricular size is normal. Right ventricular systolic function is normal. Tricuspid regurgitation signal is inadequate for assessing PA pressure. The tricuspid regurgitant velocity is 2.76 m/s, and with an assumed right atrial  pressure of 3 mmHg,  the estimated right ventricular systolic pressure is 45.8 mmHg. Left Atrium: Left atrial size was normal in size. Right Atrium: Right atrial size was normal in size. Pericardium: Trivial pericardial effusion is present. Mitral Valve: The mitral valve is normal in structure. Mild mitral valve regurgitation. No evidence of mitral valve stenosis. Tricuspid Valve: The tricuspid valve is normal in structure. Tricuspid valve regurgitation is mild . No evidence of tricuspid stenosis. Aortic Valve: The aortic valve is tricuspid. Aortic valve regurgitation is not visualized. No aortic stenosis is present. Pulmonic Valve: The pulmonic valve was normal in structure. Pulmonic valve regurgitation is trivial. No evidence of pulmonic stenosis. Aorta: The aortic root is normal in size and structure. Venous: The inferior vena cava is normal in size with greater than  50% respiratory variability, suggesting right atrial pressure of 3 mmHg. IAS/Shunts: No atrial level shunt detected by color flow Doppler.  LEFT VENTRICLE PLAX 2D LVIDd:         4.60 cm     Diastology LVIDs:         2.70 cm     LV e' medial:    8.59 cm/s LV PW:         0.90 cm     LV E/e' medial:  8.6 LV IVS:        1.10 cm     LV e' lateral:   9.14 cm/s LVOT diam:     2.00 cm     LV E/e' lateral: 8.1 LV SV:         61 LV SV Index:   34          2D Longitudinal Strain LVOT Area:     3.14 cm    2D Strain GLS Avg:     -20.6 %  LV Volumes (MOD) LV vol d, MOD A2C: 84.3 ml LV vol d, MOD A4C: 78.0 ml LV vol s, MOD A2C: 35.3 ml LV vol s, MOD A4C: 34.9 ml LV SV MOD A2C:     49.0 ml LV SV MOD A4C:     78.0 ml LV SV MOD BP:      49.3 ml RIGHT VENTRICLE RV S prime:     10.70 cm/s TAPSE (M-mode): 2.2 cm LEFT ATRIUM             Index        RIGHT ATRIUM           Index LA diam:        3.30 cm 1.82 cm/m   RA Area:     14.40 cm LA Vol (A2C):   25.9 ml 14.28 ml/m  RA Volume:   33.40 ml  18.41 ml/m LA Vol (A4C):   26.5 ml 14.61 ml/m LA Biplane Vol: 28.9 ml 15.93 ml/m  AORTIC VALVE  LVOT Vmax:   85.60 cm/s LVOT Vmean:  57.900 cm/s LVOT VTI:    0.195 m  AORTA Ao Root diam: 3.20 cm Ao Asc diam:  3.30 cm MITRAL VALVE               TRICUSPID VALVE MV Area (PHT): 2.32 cm    TR Peak grad:   30.5 mmHg MV Decel Time: 327 msec    TR Vmax:        276.00 cm/s MV E velocity: 74.10 cm/s MV A velocity: 66.80 cm/s  SHUNTS MV E/A ratio:  1.11        Systemic VTI:  0.20 m                            Systemic Diam: 2.00 cm Kirk Ruths MD Electronically signed by Kirk Ruths MD Signature Date/Time: 04/05/2022/12:10:26 PM    Final     Impression/Plan: We discussed adjuvant radiotherapy today.  I recommend 4 weeks of radiation therapy to the left breast in order to reduce risk of recurrence in the breast by two thirds.  I reviewed the logistics, benefits, risks, and potential side effects of this treatment in detail. Risks may include but not necessary be limited to acute and late injury tissue in the radiation fields such as skin irritation (change in color/pigmentation, itching, dryness, pain, peeling). She may experience fatigue. We also discussed possible risk of long term  cosmetic changes or scar tissue. There is also a smaller risk for lung toxicity, cardiac toxicity, lymphedema, musculoskeletal changes, rib fragility or induction of a second malignancy, late chronic non-healing soft tissue wound.    The patient asked good questions which I answered to her satisfaction. She is enthusiastic about proceeding with treatment. A consent form has been signed and placed in her chart.  Proceed with treatment planning today and start treatment in the next week and a half.   On date of service, in total, I spent 30 minutes on this encounter. Patient was seen in person.  _____________________________________   Eppie Gibson, MD  This document serves as a record of services personally performed by Eppie Gibson, MD. It was created on her behalf by Roney Mans, a trained medical scribe. The creation of  this record is based on the scribe's personal observations and the provider's statements to them. This document has been checked and approved by the attending provider.

## 2022-04-16 ENCOUNTER — Telehealth: Payer: Self-pay

## 2022-04-16 ENCOUNTER — Ambulatory Visit
Admission: RE | Admit: 2022-04-16 | Discharge: 2022-04-16 | Disposition: A | Discharge status: Home / Self Care | Payer: Medicare Other | Source: Ambulatory Visit | Attending: Radiation Oncology | Admitting: Radiation Oncology

## 2022-04-16 ENCOUNTER — Encounter: Payer: Self-pay | Admitting: Family Medicine

## 2022-04-16 ENCOUNTER — Other Ambulatory Visit: Payer: Self-pay

## 2022-04-16 ENCOUNTER — Encounter: Payer: Self-pay | Admitting: Radiation Oncology

## 2022-04-16 VITALS — BP 171/77 | HR 62 | Temp 98.8°F | Resp 18 | Ht 67.0 in | Wt 154.1 lb

## 2022-04-16 DIAGNOSIS — Z79899 Other long term (current) drug therapy: Secondary | ICD-10-CM | POA: Insufficient documentation

## 2022-04-16 DIAGNOSIS — Z510 Encounter for antineoplastic radiation therapy: Secondary | ICD-10-CM | POA: Diagnosis not present

## 2022-04-16 DIAGNOSIS — C50412 Malignant neoplasm of upper-outer quadrant of left female breast: Secondary | ICD-10-CM | POA: Diagnosis not present

## 2022-04-16 DIAGNOSIS — Z170 Estrogen receptor positive status [ER+]: Secondary | ICD-10-CM

## 2022-04-16 DIAGNOSIS — I071 Rheumatic tricuspid insufficiency: Secondary | ICD-10-CM | POA: Diagnosis not present

## 2022-04-16 DIAGNOSIS — I3139 Other pericardial effusion (noninflammatory): Secondary | ICD-10-CM | POA: Diagnosis not present

## 2022-04-16 DIAGNOSIS — Z79811 Long term (current) use of aromatase inhibitors: Secondary | ICD-10-CM | POA: Insufficient documentation

## 2022-04-16 NOTE — Telephone Encounter (Signed)
Can we get patient in for nurse visit BP check, follow up with Dr. Birdie Riddle for elevated BP in 3 weeks or so?  If she is having new headaches, visual changes, pain in her calves, chest pain, or irregular heart beat since her blood pressure has rise, she should be seen in Urgent Care or ER  Thanks,  Denice Paradise

## 2022-04-16 NOTE — Telephone Encounter (Signed)
Spoke with pt to confirm appt for 05/19/22 herceptin, and pt asked for clarification on PA through [employer]. Advised pt per pharmacy note she is approved indefinitely. Pt verbalized thanks and understanding and knows to call with any further questions.

## 2022-04-21 ENCOUNTER — Encounter: Payer: Self-pay | Admitting: Registered Nurse

## 2022-04-21 ENCOUNTER — Ambulatory Visit (INDEPENDENT_AMBULATORY_CARE_PROVIDER_SITE_OTHER): Payer: Medicare Other | Admitting: Registered Nurse

## 2022-04-21 ENCOUNTER — Other Ambulatory Visit: Payer: Self-pay

## 2022-04-21 VITALS — BP 138/74 | HR 69 | Temp 98.2°F | Resp 18 | Ht 67.0 in | Wt 153.0 lb

## 2022-04-21 DIAGNOSIS — R030 Elevated blood-pressure reading, without diagnosis of hypertension: Secondary | ICD-10-CM

## 2022-04-21 MED ORDER — AMLODIPINE BESYLATE 2.5 MG PO TABS: 2.5000 mg | ORAL_TABLET | Freq: Every day | ORAL | 1 refills | Status: DC

## 2022-04-21 NOTE — Progress Notes (Signed)
Established Patient Office Visit  Subjective:  Patient ID: Julie Pugh, female    DOB: May 26, 1956  Age: 66 y.o. MRN: [id number]  CC:  Chief Complaint  Patient presents with   Hypertension    Patient states she is here to check BP.    HPI Julie Pugh presents for recheck bp  Hypertension: Patient Currently taking: lifestyle management Good effect. No AEs. Denies CV symptoms including: chest pain, shob, doe, headache, visual changes, fatigue, claudication, and dependent edema.   Previous readings and labs: BP Readings from Last 3 Encounters:  04/21/22 138/74  04/16/22 (!) 171/77  04/07/22 (!) 156/81   Lab Results  Component Value Date   CREATININE 0.95 10/22/2021   She is going through cancer treatment which she feels is affecting things.   Outpatient Medications Prior to Visit  Medication Sig Dispense Refill   Bioflavonoid Products (ESTER-C) 332-951-88 MG TABS Take by mouth.     Cholecalciferol (VITAMIN D) 2000 units tablet Take 5,000 Units by mouth daily.     letrozole (FEMARA) 2.5 MG tablet Take 1 tablet (2.5 mg total) by mouth daily. 90 tablet 3   levothyroxine (SYNTHROID) 100 MCG tablet TAKE 1 TABLET(100 MCG) BY MOUTH DAILY 90 tablet 3   Multiple Vitamins-Minerals (CENTRUM SILVER 50+WOMEN PO) Take by mouth.     METRONIDAZOLE, TOPICAL, 0.75 % LOTN APP TO FACE AT NIGHT UTD (Patient not taking: Reported on 04/21/2022)     No facility-administered medications prior to visit.    Review of Systems  Constitutional: Negative.   HENT: Negative.    Eyes: Negative.   Respiratory: Negative.    Cardiovascular: Negative.   Gastrointestinal: Negative.   Genitourinary: Negative.   Musculoskeletal: Negative.   Skin: Negative.   Neurological: Negative.   Psychiatric/Behavioral: Negative.    All other systems reviewed and are negative.     Objective:     BP 138/74   Pulse 69   Temp 98.2 F (36.8 C) (Temporal)   Resp 18   Ht '5\' 7"'$  (1.702 m)   Wt 153 lb  (69.4 kg)   SpO2 98%   BMI 23.96 kg/m   Wt Readings from Last 3 Encounters:  04/21/22 153 lb (69.4 kg)  04/16/22 154 lb 2 oz (69.9 kg)  04/07/22 152 lb 8 oz (69.2 kg)   Physical Exam Vitals and nursing note reviewed.  Constitutional:      General: She is not in acute distress.    Appearance: Normal appearance. She is normal weight. She is not ill-appearing, toxic-appearing or diaphoretic.  Cardiovascular:     Rate and Rhythm: Normal rate and regular rhythm.     Heart sounds: Normal heart sounds. No murmur heard.    No friction rub. No gallop.  Pulmonary:     Effort: Pulmonary effort is normal. No respiratory distress.     Breath sounds: Normal breath sounds. No stridor. No wheezing, rhonchi or rales.  Chest:     Chest wall: No tenderness.  Skin:    General: Skin is warm and dry.  Neurological:     General: No focal deficit present.     Mental Status: She is alert and oriented to person, place, and time. Mental status is at baseline.  Psychiatric:        Mood and Affect: Mood normal.        Behavior: Behavior normal.        Thought Content: Thought content normal.        Judgment: Judgment normal.  No results found for any visits on 04/21/22.    The 10-year ASCVD risk score (Arnett DK, et al., 2019) is: 9.3%    Assessment & Plan:   Problem List Items Addressed This Visit       Other   Elevated blood pressure reading - Primary    Start on amlodipine 2.'5mg'$  po qd. Advised on risks. She will call for nurse visit BP check in 3-4 weeks and bring her home cuff with her. Follow up as scheduled with PCP.      Relevant Medications   amLODipine (NORVASC) 2.5 MG tablet    Meds ordered this encounter  Medications   amLODipine (NORVASC) 2.5 MG tablet    Sig: Take 1 tablet (2.5 mg total) by mouth daily.    Dispense:  90 tablet    Refill:  1    Order Specific Question:   Supervising Provider    Answer:   Carlota Raspberry, JEFFREY R [[id number]]    Return if symptoms worsen or  fail to improve.    Maximiano Coss, NP

## 2022-04-21 NOTE — Patient Instructions (Addendum)
Ms. Moffitt -  Doristine Devoid to see you  Let me know how the BP looks in the next 1-2 weeks.   We can plan from there.  Good luck in the pickleball tournament!  Thanks,  Denice Paradise

## 2022-04-22 ENCOUNTER — Encounter: Payer: Self-pay | Admitting: *Deleted

## 2022-04-23 DIAGNOSIS — R030 Elevated blood-pressure reading, without diagnosis of hypertension: Secondary | ICD-10-CM | POA: Insufficient documentation

## 2022-04-23 NOTE — Assessment & Plan Note (Signed)
Start on amlodipine 2.'5mg'$  po qd. Advised on risks. She will call for nurse visit BP check in 3-4 weeks and bring her home cuff with her. Follow up as scheduled with PCP.

## 2022-04-27 DIAGNOSIS — C50412 Malignant neoplasm of upper-outer quadrant of left female breast: Secondary | ICD-10-CM | POA: Diagnosis not present

## 2022-04-27 DIAGNOSIS — Z510 Encounter for antineoplastic radiation therapy: Secondary | ICD-10-CM | POA: Diagnosis not present

## 2022-04-27 DIAGNOSIS — Z170 Estrogen receptor positive status [ER+]: Secondary | ICD-10-CM | POA: Diagnosis not present

## 2022-04-28 ENCOUNTER — Other Ambulatory Visit: Payer: Self-pay

## 2022-04-28 ENCOUNTER — Inpatient Hospital Stay: Payer: Medicare Other

## 2022-04-28 ENCOUNTER — Inpatient Hospital Stay: Payer: Medicare Other | Attending: Hematology and Oncology | Admitting: Adult Health

## 2022-04-28 VITALS — BP 164/87 | HR 64 | Temp 98.1°F | Resp 18 | Ht 67.0 in | Wt 154.1 lb

## 2022-04-28 VITALS — BP 146/80 | HR 58 | Temp 98.0°F | Resp 18

## 2022-04-28 DIAGNOSIS — Z170 Estrogen receptor positive status [ER+]: Secondary | ICD-10-CM

## 2022-04-28 DIAGNOSIS — Z79899 Other long term (current) drug therapy: Secondary | ICD-10-CM | POA: Insufficient documentation

## 2022-04-28 DIAGNOSIS — E785 Hyperlipidemia, unspecified: Secondary | ICD-10-CM | POA: Diagnosis not present

## 2022-04-28 DIAGNOSIS — Z79811 Long term (current) use of aromatase inhibitors: Secondary | ICD-10-CM | POA: Diagnosis not present

## 2022-04-28 DIAGNOSIS — E559 Vitamin D deficiency, unspecified: Secondary | ICD-10-CM | POA: Insufficient documentation

## 2022-04-28 DIAGNOSIS — Z85820 Personal history of malignant melanoma of skin: Secondary | ICD-10-CM | POA: Diagnosis not present

## 2022-04-28 DIAGNOSIS — Z5112 Encounter for antineoplastic immunotherapy: Secondary | ICD-10-CM | POA: Diagnosis not present

## 2022-04-28 DIAGNOSIS — C50412 Malignant neoplasm of upper-outer quadrant of left female breast: Secondary | ICD-10-CM | POA: Diagnosis not present

## 2022-04-28 DIAGNOSIS — E039 Hypothyroidism, unspecified: Secondary | ICD-10-CM | POA: Diagnosis not present

## 2022-04-28 MED ORDER — DIPHENHYDRAMINE HCL 25 MG PO CAPS
25.0000 mg | ORAL_CAPSULE | Freq: Once | ORAL | Status: AC
  Administered 2022-04-28: 25 mg via ORAL
  Filled 2022-04-28: qty 1

## 2022-04-28 MED ORDER — TRASTUZUMAB-HYALURONIDASE-OYSK 600-10000 MG-UNT/5ML ~~LOC~~ SOLN
600.0000 mg | Freq: Once | SUBCUTANEOUS | Status: AC
  Administered 2022-04-28: 600 mg via SUBCUTANEOUS
  Filled 2022-04-28: qty 5

## 2022-04-28 MED ORDER — ACETAMINOPHEN 325 MG PO TABS
650.0000 mg | ORAL_TABLET | Freq: Once | ORAL | Status: AC
  Administered 2022-04-28: 650 mg via ORAL
  Filled 2022-04-28: qty 2

## 2022-04-28 NOTE — Progress Notes (Signed)
[facility] Follow up:    Julie Minium, MD [address] [place] [address]   DIAGNOSIS:  Cancer Staging  Malignant neoplasm of upper-outer quadrant of left breast in female, estrogen receptor positive ([place]) Staging form: Breast, AJCC 8th Edition - Clinical: Stage IA (cT1b, cN0, cM0, G2, ER+, PR-, HER2+) - Signed by Nicholas Lose, MD on 02/17/2022 Stage prefix: Initial diagnosis Histologic grading system: 3 grade system - Pathologic: Stage IA (pT1a, pN0, cM0, G3, ER+, PR-, HER2+) - Signed by Gardenia Phlegm, NP on 05/09/2022 Histologic grading system: 3 grade system   SUMMARY OF ONCOLOGIC HISTORY: Oncology History  Malignant neoplasm of upper-outer quadrant of left breast in female, estrogen receptor positive ([place])  02/12/2022 Initial Diagnosis   Screening mammogram detected left breast mass, by ultrasound measured 0.6 cm, biopsy revealed grade 2 IDC ER 100%, PR 2%, HER2 equivocal by IHC and FISH positive, Ki-67 30%   02/17/2022 Cancer Staging   Staging form: Breast, AJCC 8th Edition - Clinical: Stage IA (cT1b, cN0, cM0, G2, ER+, PR-, HER2+) - Signed by Nicholas Lose, MD on 02/17/2022 Stage prefix: Initial diagnosis Histologic grading system: 3 grade system   03/16/2022 Surgery   Left lumpectomy: Grade 3 IDC 5 mm, negative for lymphovascular invasion, ER 100%, PR 2%, HER2 FISH positive, margins negative, 0/2 lymph nodes negative   03/29/2022 -  Anti-estrogen oral therapy   Letrozole daily   04/07/2022 -  Chemotherapy   Patient is on Treatment Plan : BREAST Trastuzumab q21d x 13 cycles     04/29/2022 - 05/26/2022 Radiation Therapy   Adjuvant Radiation therapy   05/05/2022 Genetic Testing   Referral pending   05/09/2022 Cancer Staging   Staging form: Breast, AJCC 8th Edition - Pathologic: Stage IA (pT1a, pN0, cM0, G3, ER+, PR-, HER2+) - Signed by Gardenia Phlegm, NP on 05/09/2022 Histologic grading system: 3 grade system      CURRENT THERAPY: Herceptin/Trastuzumab; Letrozole  INTERVAL HISTORY: Julie Pugh 66 y.o. female returns for f/u prior to receiving Herceptin.  She is doing well today.  She is taking Letrozole and is tolerating it moderately well.  Her most recent echo occurred on 04/05/2022 with EF of 60-65%.     Patient Active Problem List   Diagnosis Date Noted   Family history of breast cancer 05/05/2022   Family history of pancreatic cancer 05/05/2022   Family history of prostate cancer 05/05/2022   Elevated blood pressure reading 04/23/2022   Malignant neoplasm of upper-outer quadrant of left breast in female, estrogen receptor positive ([place]) 02/12/2022   History of COVID-19 06/12/2020   Visit for preventive health examination 09/28/2017   Vitamin D deficiency 05/26/2017   Hyperlipidemia 07/10/2015   Hypothyroidism    History of melanoma    Varicose vein     has No Known Allergies.  MEDICAL HISTORY: Past Medical History:  Diagnosis Date   Family history of breast cancer    Family history of pancreatic cancer    Family history of prostate cancer    Hx of melanoma of skin    Hyperlipidemia    Hypothyroidism    Dr. Jaynie Collins managing. armour thyroid 36m    [place]    R [place] 2009. sees derm q6 months   Varicose vein     SURGICAL HISTORY: Past Surgical History:  Procedure Laterality Date   BREAST BIOPSY     benign 2000-stereotactic biopsy   BREAST BIOPSY Right 03/12/2004   BREAST LUMPECTOMY  WITH RADIOACTIVE SEED AND SENTINEL LYMPH NODE BIOPSY Left 03/16/2022   Procedure: LEFT BREAST LUMPECTOMY WITH RADIOACTIVE SEED AND AXILLARY SENTINEL LYMPH NODE BIOPSY;  Surgeon: Rolm Bookbinder, MD;  Location: [facility];  Service: General;  Laterality: Left;   VARICOSE VEIN SURGERY     2001    SOCIAL HISTORY: Social History   Socioeconomic History   Marital status: Married    Spouse name: Not on file   Number of children: Not on file   Years of education:  Not on file   Highest education level: Not on file  Occupational History   Not on file  Tobacco Use   Smoking status: Never    Passive exposure: Past   Smokeless tobacco: Never  Vaping Use   Vaping Use: Never used  Substance and Sexual Activity   Alcohol use: Yes    Alcohol/week: 4.0 standard drinks of alcohol    Types: 4 Standard drinks or equivalent per week   Drug use: No   Sexual activity: Yes    Birth control/protection: Post-menopausal  Other Topics Concern   Not on file  Social History Narrative   Married (marriage is a stressor). 1 daughter Cloyde Reams at [employer] age 54 in 2016.       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       Hobbies: tennis, walking, time with dogs, pickleball. Reading. Outdoors.    Social Determinants of Health   Financial Resource Strain: Not on file  Food Insecurity: Not on file  Transportation Needs: Not on file  Physical Activity: Not on file  Stress: Not on file  Social Connections: Not on file  Intimate Partner Violence: Not on file    FAMILY HISTORY: Family History  Problem Relation Age of Onset   Panic disorder Mother    COPD Mother        smoked until 16   Congestive Heart Failure Mother        pacemaker   Hypertension Mother    Heart attack Father        bypass 65, 71 fatal MI, former smoker   Prostate cancer Father 62   Hyperlipidemia Sister    Breast cancer Maternal Aunt    Pancreatic cancer Cousin 12    Review of Systems  Constitutional:  Negative for appetite change, chills, fatigue, fever and unexpected weight change.  HENT:   Negative for hearing loss, lump/mass and trouble swallowing.   Eyes:  Negative for eye problems and icterus.  Respiratory:  Negative for chest tightness, cough and shortness of breath.   Cardiovascular:  Negative for chest pain, leg swelling and palpitations.  Gastrointestinal:  Negative for abdominal distention, abdominal pain, constipation,  diarrhea, nausea and vomiting.  Endocrine: Negative for hot flashes.  Genitourinary:  Negative for difficulty urinating.   Musculoskeletal:  Negative for arthralgias.  Skin:  Negative for itching and rash.  Neurological:  Negative for dizziness, extremity weakness, headaches and numbness.  Hematological:  Negative for adenopathy. Does not bruise/bleed easily.  Psychiatric/Behavioral:  Negative for depression. The patient is not nervous/anxious.       PHYSICAL EXAMINATION  ECOG PERFORMANCE STATUS: 1 - Symptomatic but completely ambulatory  Vitals:   04/28/22 1018  BP: (!) 164/87  Pulse: 64  Resp: 18  Temp: 98.1 F (36.7 C)  SpO2: 99%    Physical Exam Constitutional:      General: She is not in acute distress.    Appearance: Normal  appearance. She is not toxic-appearing.  HENT:     Head: Normocephalic and atraumatic.  Eyes:     General: No scleral icterus. Cardiovascular:     Rate and Rhythm: Normal rate and regular rhythm.     Pulses: Normal pulses.     Heart sounds: Normal heart sounds.  Pulmonary:     Effort: Pulmonary effort is normal.     Breath sounds: Normal breath sounds.  Abdominal:     General: Abdomen is flat. Bowel sounds are normal. There is no distension.     Palpations: Abdomen is soft.     Tenderness: There is no abdominal tenderness.  Musculoskeletal:        General: No swelling.     Cervical back: Neck supple.  Lymphadenopathy:     Cervical: No cervical adenopathy.  Skin:    General: Skin is warm and dry.     Findings: No rash.  Neurological:     General: No focal deficit present.     Mental Status: She is alert.  Psychiatric:        Mood and Affect: Mood normal.        Behavior: Behavior normal.     LABORATORY DATA:  None for this visit  ASSESSMENT and THERAPY PLAN:   Malignant neoplasm of upper-outer quadrant of left breast in female, estrogen receptor positive ([place]) Leshea is a 66 year old woman with HER2 positive breast cancer here  today for evaluation prior to receiving Herceptin therapy.  She will continue to receive this every 3 weeks for a total of 1 year.  She will also continue taking letrozole daily.  Her most recent echo was completed on April 05, 2022 and was normal.  She will be due for repeat in September 2023.  Aedyn will continue to receive Herceptin every 3 weeks and we will continue to see her with every other treatment.    All questions were answered. The patient knows to call the clinic with any problems, questions or concerns. We can certainly see the patient much sooner if necessary.  Total encounter time:20 minutes*in face-to-face visit time, chart review, lab review, care coordination, order entry, and documentation of the encounter time.    Wilber Bihari, NP 05/09/22 11:11 AM Medical Oncology and Hematology [facility] [place], [place] [address] Tel. [phone]    Fax. [phone]  *Total Encounter Time as defined by the Centers for Medicare and Medicaid Services includes, in addition to the face-to-face time of a patient visit (documented in the note above) non-face-to-face time: obtaining and reviewing outside history, ordering and reviewing medications, tests or procedures, care coordination (communications with other health care professionals or caregivers) and documentation in the medical record.

## 2022-04-28 NOTE — Patient Instructions (Signed)
[facility]  Discharge Instructions: Thank you for choosing [facility] to provide your oncology and hematology care.   If you have a lab appointment with the [facility], please go directly to the [facility] and check in at the registration area.   Wear comfortable clothing and clothing appropriate for easy access to any Portacath or PICC line.   We strive to give you quality time with your provider. You may need to reschedule your appointment if you arrive late (15 or more minutes).  Arriving late affects you and other patients whose appointments are after yours.  Also, if you miss three or more appointments without notifying the office, you may be dismissed from the clinic at the provider's discretion.      For prescription refill requests, have your pharmacy contact our office and allow 72 hours for refills to be completed.    Today you received the following chemotherapy and/or immunotherapy agents   TRASTUZUMAB; HYALURONIDASE    To help prevent nausea and vomiting after your treatment, we encourage you to take your nausea medication as directed.  BELOW ARE SYMPTOMS THAT SHOULD BE REPORTED IMMEDIATELY: *FEVER GREATER THAN 100.4 F (38 C) OR HIGHER *CHILLS OR SWEATING *NAUSEA AND VOMITING THAT IS NOT CONTROLLED WITH YOUR NAUSEA MEDICATION *UNUSUAL SHORTNESS OF BREATH *UNUSUAL BRUISING OR BLEEDING *URINARY PROBLEMS (pain or burning when urinating, or frequent urination) *BOWEL PROBLEMS (unusual diarrhea, constipation, pain near the anus) TENDERNESS IN MOUTH AND THROAT WITH OR WITHOUT PRESENCE OF ULCERS (sore throat, sores in mouth, or a toothache) UNUSUAL RASH, SWELLING OR PAIN  UNUSUAL VAGINAL DISCHARGE OR ITCHING   Items with * indicate a potential emergency and should be followed up as soon as possible or go to the Emergency Department if any problems should occur.  Please show the CHEMOTHERAPY ALERT CARD or IMMUNOTHERAPY ALERT CARD  at check-in to the Emergency Department and triage nurse.  Should you have questions after your visit or need to cancel or reschedule your appointment, please contact [facility]  Dept: [phone]  and follow the prompts.  Office hours are 8:00 a.m. to 4:30 p.m. Monday - Friday. Please note that voicemails left after 4:00 p.m. may not be returned until the following business day.  We are closed weekends and major holidays. You have access to a nurse at all times for urgent questions. Please call the main number to the clinic Dept: [phone] and follow the prompts.   For any non-urgent questions, you may also contact your provider using MyChart. We now offer e-Visits for anyone 42 and older to request care online for non-urgent symptoms. For details visit [email or web address].   Also download the MyChart app! Go to the app store, search "MyChart", open the app, select [facility], and log in with your MyChart username and password.  Masks are optional in the cancer centers. If you would like for your care team to wear a mask while they are taking care of you, please let them know. For doctor visits, patients may have with them one support person who is at least 68 years old. At this time, visitors are not allowed in the infusion area.Trastuzumab; Hyaluronidase injection What is this medication?   TRASTUZUMAB; HYALURONIDASE (tras TOO zoo mab / hye al ur ON i dase) is used to treat breast cancer and stomach cancer. Trastuzumab is a monoclonal antibody. Hyaluronidase is used to improve the effects of trastuzumab. This medicine may be used for  other purposes; ask your health care provider or pharmacist if you have questions. COMMON BRAND NAME(S): HERCEPTIN HYLECTA What should I tell my care team before I take this medication? They need to know if you have any of these conditions: heart disease heart failure lung or breathing disease, like asthma an unusual or  allergic reaction to trastuzumab, or other medications, foods, dyes, or preservatives pregnant or trying to get pregnant breast-feeding How should I use this medication? This medicine is for injection under the skin. It is given by a health care professional in a hospital or clinic setting. Talk to your pediatrician regarding the use of this medicine in children. This medicine is not approved for use in children. Overdosage: If you think you have taken too much of this medicine contact a poison control center or emergency room at once. NOTE: This medicine is only for you. Do not share this medicine with others. What if I miss a dose? It is important not to miss a dose. Call your doctor or health care professional if you are unable to keep an appointment. What may interact with this medication? This medicine may interact with the following medications: certain types of chemotherapy, such as daunorubicin, doxorubicin, epirubicin, and idarubicin This list may not describe all possible interactions. Give your health care provider a list of all the medicines, herbs, non-prescription drugs, or dietary supplements you use. Also tell them if you smoke, drink alcohol, or use illegal drugs. Some items may interact with your medicine. What should I watch for while using this medication? Visit your doctor for checks on your progress. Report any side effects. Continue your course of treatment even though you feel ill unless your doctor tells you to stop. Call your doctor or health care professional for advice if you get a fever, chills or sore throat, or other symptoms of a cold or flu. Do not treat yourself. Try to avoid being around people who are sick. You may experience fever, chills and shaking during your first infusion. These effects are usually mild and can be treated with other medicines. Report any side effects during the infusion to your health care professional. Fever and chills usually do not happen  with later infusions. Do not become pregnant while taking this medicine or for 7 months after stopping it. Women should inform their doctor if they wish to become pregnant or think they might be pregnant. Women of child-bearing potential will need to have a negative pregnancy test before starting this medicine. There is a potential for serious side effects to an unborn child. Talk to your health care professional or pharmacist for more information. Do not breast-feed an infant while taking this medicine or for 7 months after stopping it. What side effects may I notice from receiving this medication? Side effects that you should report to your doctor or health care professional as soon as possible: allergic reactions like skin rash, itching or hives, swelling of the face, lips, or tongue breathing problems chest pain or palpitations cough fever general ill feeling or flu-like symptoms signs of worsening heart failure like breathing problems; swelling in your legs and feet Side effects that usually do not require medical attention (report these to your doctor or health care professional if they continue or are bothersome): bone pain changes in taste diarrhea joint pain nausea/vomiting unusually weak or tired weight loss This list may not describe all possible side effects. Call your doctor for medical advice about side effects. You may report side effects  to FDA at 1-800-FDA-1088. Where should I keep my medication? This drug is given in a hospital or clinic and will not be stored at home. NOTE: This sheet is a summary. It may not cover all possible information. If you have questions about this medicine, talk to your doctor, pharmacist, or health care provider.  2023 Elsevier/Gold Standard (2018-02-13 00:00:00)

## 2022-04-29 ENCOUNTER — Other Ambulatory Visit: Payer: Self-pay

## 2022-04-29 ENCOUNTER — Ambulatory Visit
Admission: RE | Admit: 2022-04-29 | Discharge: 2022-04-29 | Disposition: A | Discharge status: Home / Self Care | Payer: Medicare Other | Source: Ambulatory Visit | Attending: Radiation Oncology | Admitting: Radiation Oncology

## 2022-04-29 DIAGNOSIS — Z510 Encounter for antineoplastic radiation therapy: Secondary | ICD-10-CM | POA: Diagnosis not present

## 2022-04-29 DIAGNOSIS — C50412 Malignant neoplasm of upper-outer quadrant of left female breast: Secondary | ICD-10-CM | POA: Diagnosis not present

## 2022-04-29 DIAGNOSIS — Z170 Estrogen receptor positive status [ER+]: Secondary | ICD-10-CM | POA: Diagnosis not present

## 2022-04-29 LAB — RAD ONC ARIA SESSION SUMMARY
Course Elapsed Days: 0
Plan Fractions Treated to Date: 1
Plan Prescribed Dose Per Fraction: 2.67 Gy
Plan Total Fractions Prescribed: 15
Plan Total Prescribed Dose: 40.05 Gy
Reference Point Dosage Given to Date: 2.67 Gy
Reference Point Session Dosage Given: 2.67 Gy
Session Number: 1

## 2022-04-30 ENCOUNTER — Other Ambulatory Visit: Payer: Self-pay

## 2022-04-30 ENCOUNTER — Ambulatory Visit
Admission: RE | Admit: 2022-04-30 | Discharge: 2022-04-30 | Disposition: A | Discharge status: Home / Self Care | Payer: Medicare Other | Source: Ambulatory Visit | Attending: Radiation Oncology | Admitting: Radiation Oncology

## 2022-04-30 ENCOUNTER — Telehealth: Payer: Self-pay | Admitting: Family Medicine

## 2022-04-30 DIAGNOSIS — Z510 Encounter for antineoplastic radiation therapy: Secondary | ICD-10-CM | POA: Diagnosis not present

## 2022-04-30 DIAGNOSIS — Z170 Estrogen receptor positive status [ER+]: Secondary | ICD-10-CM | POA: Diagnosis not present

## 2022-04-30 DIAGNOSIS — R030 Elevated blood-pressure reading, without diagnosis of hypertension: Secondary | ICD-10-CM

## 2022-04-30 DIAGNOSIS — C50412 Malignant neoplasm of upper-outer quadrant of left female breast: Secondary | ICD-10-CM | POA: Diagnosis not present

## 2022-04-30 LAB — RAD ONC ARIA SESSION SUMMARY
Course Elapsed Days: 1
Plan Fractions Treated to Date: 2
Plan Prescribed Dose Per Fraction: 2.67 Gy
Plan Total Fractions Prescribed: 15
Plan Total Prescribed Dose: 40.05 Gy
Reference Point Dosage Given to Date: 5.34 Gy
Reference Point Session Dosage Given: 2.67 Gy
Session Number: 2

## 2022-04-30 MED ORDER — AMLODIPINE BESYLATE 2.5 MG PO TABS: 2.5000 mg | ORAL_TABLET | Freq: Every day | ORAL | 1 refills | Status: DC

## 2022-04-30 NOTE — Telephone Encounter (Signed)
Refill sent in to pharmacy 

## 2022-04-30 NOTE — Telephone Encounter (Signed)
Encourage patient to contact the pharmacy for refills or they can request refills through [facility]  (Please schedule appointment if patient has not been seen in over a year)    WHAT PHARMACY WOULD THEY LIKE THIS SENT TO: [facility] [phone]  MEDICATION NAME & DOSE: amlodipine 2.5 mg  NOTES/COMMENTS FROM PATIENT: Please call pt once called in to pharmacy [phone]. Ok to leave message on VM.      [facility] office please notify patient: It takes 48-72 hours to process rx refill requests Ask patient to call pharmacy to ensure rx is ready before heading there.

## 2022-05-03 ENCOUNTER — Ambulatory Visit: Payer: Medicare Other

## 2022-05-03 ENCOUNTER — Other Ambulatory Visit: Payer: Self-pay

## 2022-05-03 ENCOUNTER — Ambulatory Visit
Admission: RE | Admit: 2022-05-03 | Discharge: 2022-05-03 | Disposition: A | Discharge status: Home / Self Care | Payer: Medicare Other | Source: Ambulatory Visit | Attending: Radiation Oncology | Admitting: Radiation Oncology

## 2022-05-03 DIAGNOSIS — C50412 Malignant neoplasm of upper-outer quadrant of left female breast: Secondary | ICD-10-CM

## 2022-05-03 DIAGNOSIS — Z510 Encounter for antineoplastic radiation therapy: Secondary | ICD-10-CM | POA: Diagnosis not present

## 2022-05-03 DIAGNOSIS — Z170 Estrogen receptor positive status [ER+]: Secondary | ICD-10-CM | POA: Diagnosis not present

## 2022-05-03 LAB — RAD ONC ARIA SESSION SUMMARY
Course Elapsed Days: 4
Plan Fractions Treated to Date: 3
Plan Prescribed Dose Per Fraction: 2.67 Gy
Plan Total Fractions Prescribed: 15
Plan Total Prescribed Dose: 40.05 Gy
Reference Point Dosage Given to Date: 8.01 Gy
Reference Point Session Dosage Given: 2.67 Gy
Session Number: 3

## 2022-05-03 MED ORDER — ALRA NON-METALLIC DEODORANT (RAD-ONC)
1.0000 | Freq: Once | TOPICAL | Status: AC
  Administered 2022-05-03: 1 via TOPICAL

## 2022-05-03 MED ORDER — RADIAPLEXRX EX GEL: Freq: Once | CUTANEOUS | Status: AC

## 2022-05-03 NOTE — Progress Notes (Signed)

## 2022-05-04 ENCOUNTER — Other Ambulatory Visit: Payer: Self-pay

## 2022-05-04 ENCOUNTER — Inpatient Hospital Stay: Payer: Medicare Other | Admitting: Nutrition

## 2022-05-04 ENCOUNTER — Ambulatory Visit
Admission: RE | Admit: 2022-05-04 | Discharge: 2022-05-04 | Disposition: A | Discharge status: Home / Self Care | Payer: Medicare Other | Source: Ambulatory Visit | Attending: Radiation Oncology | Admitting: Radiation Oncology

## 2022-05-04 DIAGNOSIS — Z170 Estrogen receptor positive status [ER+]: Secondary | ICD-10-CM | POA: Diagnosis not present

## 2022-05-04 DIAGNOSIS — Z510 Encounter for antineoplastic radiation therapy: Secondary | ICD-10-CM | POA: Diagnosis not present

## 2022-05-04 DIAGNOSIS — C50412 Malignant neoplasm of upper-outer quadrant of left female breast: Secondary | ICD-10-CM | POA: Diagnosis not present

## 2022-05-04 LAB — RAD ONC ARIA SESSION SUMMARY
Course Elapsed Days: 5
Plan Fractions Treated to Date: 4
Plan Prescribed Dose Per Fraction: 2.67 Gy
Plan Total Fractions Prescribed: 15
Plan Total Prescribed Dose: 40.05 Gy
Reference Point Dosage Given to Date: 10.68 Gy
Reference Point Session Dosage Given: 2.67 Gy
Session Number: 4

## 2022-05-04 NOTE — Progress Notes (Signed)
66 year old female diagnosed with breast cancer and followed by Dr. Lindi Adie.  She is receiving Herceptin every 21 days for 13 cycles.  She is also having radiation therapy.  Past medical history includes COVID-19, vitamin D deficiency, hyperlipidemia, hypothyroidism, and melanoma.  Medications include vitamin D, Femara, Synthroid, and multivitamin.  Labs include vitamin D 50.39.  Height: 67 inches. Weight: 154 pounds 2 ounces. Usual body weight: 154 pounds. BMI: 24.13.  Patient requesting healthy diet information to state as healthy and active throughout treatment as possible. Denies nutrition impact symptoms at this time. She is at her usual body weight. Overall, patient appears to consume a variety of foods.  Nutrition diagnosis: Food and nutrition related knowledge deficit related to breast cancer and associated treatments as evidenced by no prior need for nutrition related information.  Intervention: Educated patient on healthy plant-based diet with increased fruits vegetables and whole grains. Provided reputable resources. Reviewed breast cancer nutrition. Provided multiple fact sheets.  Questions were answered.  Contact information given.  Monitoring, evaluation, goals: Patient will tolerate adequate calories and protein for weight maintenance throughout treatment.  No follow-up needed at this time.

## 2022-05-05 ENCOUNTER — Other Ambulatory Visit: Payer: Self-pay

## 2022-05-05 ENCOUNTER — Encounter: Payer: Self-pay | Admitting: Genetic Counselor

## 2022-05-05 ENCOUNTER — Other Ambulatory Visit: Payer: Self-pay | Admitting: Genetic Counselor

## 2022-05-05 ENCOUNTER — Inpatient Hospital Stay: Payer: Medicare Other

## 2022-05-05 ENCOUNTER — Inpatient Hospital Stay (HOSPITAL_BASED_OUTPATIENT_CLINIC_OR_DEPARTMENT_OTHER): Payer: Medicare Other | Admitting: Genetic Counselor

## 2022-05-05 ENCOUNTER — Ambulatory Visit
Admission: RE | Admit: 2022-05-05 | Discharge: 2022-05-05 | Disposition: A | Discharge status: Home / Self Care | Payer: Medicare Other | Source: Ambulatory Visit | Attending: Radiation Oncology | Admitting: Radiation Oncology

## 2022-05-05 DIAGNOSIS — Z800 Family history of malignant neoplasm of digestive organs: Secondary | ICD-10-CM | POA: Diagnosis not present

## 2022-05-05 DIAGNOSIS — C50412 Malignant neoplasm of upper-outer quadrant of left female breast: Secondary | ICD-10-CM | POA: Diagnosis not present

## 2022-05-05 DIAGNOSIS — Z510 Encounter for antineoplastic radiation therapy: Secondary | ICD-10-CM | POA: Diagnosis not present

## 2022-05-05 DIAGNOSIS — Z170 Estrogen receptor positive status [ER+]: Secondary | ICD-10-CM

## 2022-05-05 DIAGNOSIS — Z803 Family history of malignant neoplasm of breast: Secondary | ICD-10-CM

## 2022-05-05 DIAGNOSIS — Z8042 Family history of malignant neoplasm of prostate: Secondary | ICD-10-CM

## 2022-05-05 DIAGNOSIS — Z85820 Personal history of malignant melanoma of skin: Secondary | ICD-10-CM

## 2022-05-05 LAB — RAD ONC ARIA SESSION SUMMARY
Course Elapsed Days: 6
Plan Fractions Treated to Date: 5
Plan Prescribed Dose Per Fraction: 2.67 Gy
Plan Total Fractions Prescribed: 15
Plan Total Prescribed Dose: 40.05 Gy
Reference Point Dosage Given to Date: 13.35 Gy
Reference Point Session Dosage Given: 2.67 Gy
Session Number: 5

## 2022-05-05 LAB — GENETIC SCREENING ORDER

## 2022-05-05 NOTE — Progress Notes (Signed)
REFERRING PROVIDER: Nicholas Lose, MD [place],  [place] [id number]  PRIMARY PROVIDER:  [name], MD  PRIMARY REASON FOR VISIT:  1. Family history of breast cancer   2. Family history of pancreatic cancer   3. Family history of prostate cancer   4. Malignant neoplasm of upper-outer quadrant of left breast in female, estrogen receptor positive ([facility])   5. History of melanoma      HISTORY OF PRESENT ILLNESS:   Ms. Felmlee, a 66 y.o. female, was seen for a [facility] cancer genetics consultation at the request of Dr. Lindi Adie due to a personal and family history of cancer.  Ms. Seals presents to clinic today to discuss the possibility of a hereditary predisposition to cancer, genetic testing, and to further clarify her future cancer risks, as well as potential cancer risks for family members.   In 2014, at the age of 38, Ms. 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 The treatment plan included [place] surgery.  In May 2023, at the age of 15, Ms. Shedlock was diagnosed with breast cancer.  The treatment includes chemotherapy and lumpectomy.     CANCER HISTORY:  Oncology History  Malignant neoplasm of upper-outer quadrant of left breast in female, estrogen receptor positive ([facility])  02/12/2022 Initial Diagnosis   Screening mammogram detected left breast mass, by ultrasound measured 0.6 cm, biopsy revealed grade 2 IDC ER 100%, PR 2%, HER2 equivocal by IHC and FISH positive, Ki-67 30%   02/17/2022 Cancer Staging   Staging form: Breast, AJCC 8th Edition - Clinical: Stage IA (cT1b, cN0, cM0, G2, ER+, PR-, HER2+) - Signed by Nicholas Lose, MD on 02/17/2022 Stage prefix: Initial diagnosis Histologic grading system: 3 grade system   03/16/2022 Surgery   Left lumpectomy: Grade 3 IDC 5 mm, negative for lymphovascular invasion, ER 100%, PR 2%, HER2 FISH positive, margins negative, 0/2 lymph nodes negative   03/29/2022 -  Anti-estrogen oral therapy   Letrozole  daily   04/07/2022 -  Chemotherapy   Patient is on Treatment Plan : BREAST Trastuzumab q21d x 13 cycles     04/29/2022 - 05/26/2022 Radiation Therapy   Adjuvant Radiation therapy   05/05/2022 Genetic Testing   Referral pending      RISK FACTORS:  Menarche was at age 89.  First live birth at age 60.  OCP use for approximately 12 years.  Ovaries intact: yes.  Hysterectomy: no.  Menopausal status: postmenopausal.  HRT use: 0 years. Colonoscopy: yes; normal. Mammogram within the last year: yes. Number of breast biopsies: 2. Up to date with pelvic exams: no. Any excessive radiation exposure in the past: no  Past Medical History:  Diagnosis Date   Family history of breast cancer    Family history of pancreatic cancer    Family history of prostate cancer    Hx of melanoma of skin    Hyperlipidemia    Hypothyroidism    Dr. Jaynie Collins managing. armour thyroid 1m    [place]    R shin 2009. sees derm q6 months   Varicose vein     Past Surgical History:  Procedure Laterality Date   BREAST BIOPSY     benign 2000-stereotactic biopsy   BREAST BIOPSY Right 03/12/2004   BREAST LUMPECTOMY WITH RADIOACTIVE SEED AND SENTINEL LYMPH NODE BIOPSY Left 03/16/2022   Procedure: LEFT BREAST LUMPECTOMY WITH RADIOACTIVE SEED AND AXILLARY SENTINEL LYMPH NODE BIOPSY;  Surgeon: WRolm Bookbinder MD;  Location: [facility]  Service: General;  Laterality: Left;   VARICOSE VEIN SURGERY     2001    Social History   Socioeconomic History   Marital status: Married    Spouse name: Not on file   Number of children: Not on file   Years of education: Not on file   Highest education level: Not on file  Occupational History   Not on file  Tobacco Use   Smoking status: Never    Passive exposure: Past   Smokeless tobacco: Never  Vaping Use   Vaping Use: Never used  Substance and Sexual Activity   Alcohol use: Yes    Alcohol/week: 4.0 standard drinks of alcohol    Types: 4  Standard drinks or equivalent per week   Drug use: No   Sexual activity: Yes    Birth control/protection: Post-menopausal  Other Topics Concern   Not on file  Social History Narrative   Married (marriage is a stressor). 1 daughter Cloyde Reams at [employer] age 70 in 2016.       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       Hobbies: tennis, walking, time with dogs, pickleball. Reading. Outdoors.    Social Determinants of Health   Financial Resource Strain: Not on file  Food Insecurity: Not on file  Transportation Needs: Not on file  Physical Activity: Not on file  Stress: Not on file  Social Connections: Not on file     FAMILY HISTORY:  We obtained a detailed, 4-generation family history.  Significant diagnoses are listed below: Family History  Problem Relation Age of Onset   Panic disorder Mother    COPD Mother        smoked until 104   Congestive Heart Failure Mother        pacemaker   Hypertension Mother    Heart attack Father        bypass 32, 60 fatal MI, former smoker   Prostate cancer Father 45   Hyperlipidemia Sister    Breast cancer Maternal Aunt    Pancreatic cancer Cousin 52    The patient has one daughter who is cancer free.  She has one sister who is cancer free.  Both parents are deceased.    The patient's father had prostate cancer.  He had seven full and half siblings who are cancer free.  The paternal grandparents died of non cancer related issues.  The patient's mother had eight full and half siblings.  A half sister had breast cancer and another half sister had a daughter with pancreatic cancer.  There is no other reported family history of cancer.  Ms. Rilling is unaware of previous family history of genetic testing for hereditary cancer risks. Patient's maternal ancestors are of [place] descent, and paternal ancestors are of [place] descent. There is no reported Ashkenazi Jewish ancestry. There is no known  consanguinity.  GENETIC COUNSELING ASSESSMENT: Ms. Oelkers is a 66 y.o. female with a personal and family history of cancer which is somewhat suggestive of a hereditary cancer syndrome and predisposition to cancer given the combination of cancer and number of people in the family with cancer. We, therefore, discussed and recommended the following at today's visit.   DISCUSSION: We discussed that, in general, most cancer is not inherited in families, but instead is sporadic or familial. Sporadic cancers occur by chance and typically happen at older ages (>50 years) as this type of cancer is caused by genetic changes acquired during an individual's  lifetime. Some families have more cancers than would be expected by chance; however, the ages or types of cancer are not consistent with a known genetic mutation or known genetic mutations have been ruled out. This type of familial cancer is thought to be due to a combination of multiple genetic, environmental, hormonal, and lifestyle factors. While this combination of factors likely increases the risk of cancer, the exact source of this risk is not currently identifiable or testable.  We discussed that 5 - 10% of breast cancer is hereditary, with most cases associated with BRCA mutations.  There are other genes that can be associated with hereditary breast cancer syndromes.  These include ATM, CHEK2 and PALB2.  Additionally, about 5-10% of melanoma is hereditary, with most cases associated with CDKN2A.  The combination of melanoma and pancreatic cancer is suggestive of this gene.  We discussed that testing is beneficial for several reasons including knowing how to follow individuals after completing their treatment, identifying whether potential treatment options such as PARP inhibitors would be beneficial, and understand if other family members could be at risk for cancer and allow them to undergo genetic testing.   We discussed that some people do not want to  undergo genetic testing due to fear of genetic discrimination.  A federal law called the Genetic Information Non-Discrimination Act (GINA) of 2008 helps protect individuals against genetic discrimination based on their genetic test results.  It impacts both health insurance and employment.  With health insurance, it protects against increased premiums, being kicked off insurance or being forced to take a test in order to be insured.  For employment it protects against hiring, firing and promoting decisions based on genetic test results.  Health status due to a cancer diagnosis is not protected under GINA.   We reviewed the characteristics, features and inheritance patterns of hereditary cancer syndromes. We also discussed genetic testing, including the appropriate family members to test, the process of testing, insurance coverage and turn-around-time for results. We discussed the implications of a negative, positive, carrier and/or variant of uncertain significant result. We recommended Ms. Maxwell Caul pursue genetic testing for the CancerNext-Expanded+RNAinsight gene panel.   The CancerNext-Expanded gene panel offered by [facility] and includes sequencing and rearrangement analysis for the following 77 genes: AIP, ALK, APC*, ATM*, AXIN2, BAP1, BARD1, BLM, BMPR1A, BRCA1*, BRCA2*, BRIP1*, CDC73, CDH1*, CDK4, CDKN1B, CDKN2A, CHEK2*, CTNNA1, DICER1, FANCC, FH, FLCN, GALNT12, KIF1B, LZTR1, MAX, MEN1, MET, MLH1*, MSH2*, MSH3, MSH6*, MUTYH*, NBN, NF1*, NF2, NTHL1, PALB2*, PHOX2B, PMS2*, POT1, PRKAR1A, PTCH1, PTEN*, RAD51C*, RAD51D*, RB1, RECQL, RET, SDHA, SDHAF2, SDHB, SDHC, SDHD, SMAD4, SMARCA4, SMARCB1, SMARCE1, STK11, SUFU, TMEM127, TP53*, TSC1, TSC2, VHL and XRCC2 (sequencing and deletion/duplication); EGFR, EGLN1, HOXB13, KIT, MITF, PDGFRA, POLD1, and POLE (sequencing only); EPCAM and GREM1 (deletion/duplication only). DNA and RNA analyses performed for * genes.   Based on Ms. Raczkowski's personal and family  history of cancer, she meets medical criteria for genetic testing. Despite that she meets criteria, she may still have an out of pocket cost. We discussed that if her out of pocket cost for testing is over $100, the laboratory will call and confirm whether she wants to proceed with testing.  If the out of pocket cost of testing is less than $100 she will be billed by the genetic testing laboratory.   PLAN: After considering the risks, benefits, and limitations, Ms. Maxwell Caul provided informed consent to pursue genetic testing and the blood sample was sent to [facility] for analysis of the  CancerNext-Expanded+RNAinsight. Results should be available within approximately 2-3 weeks' time, at which point they will be disclosed by telephone to Ms. Girgis, as will any additional recommendations warranted by these results. Ms. Matzen will receive a summary of her genetic counseling visit and a copy of her results once available. This information will also be available in Epic.   Lastly, we encouraged Ms. Krigbaum to remain in contact with cancer genetics annually so that we can continuously update the family history and inform her of any changes in cancer genetics and testing that may be of benefit for this family.   Ms. Deller questions were answered to her satisfaction today. Our contact information was provided should additional questions or concerns arise. Thank you for the referral and allowing [place] to share in the care of your patient.   Evangelene Vora P. Florene Glen, [place], [facility] Licensed, Insurance risk surveyor [email or web address] phone: [phone]  The patient was seen for a total of 45 minutes in face-to-face genetic counseling.  The patient was seen alone.  This patient was discussed with Drs. Magrinat, Lindi Adie and/or Burr Medico who agrees with the above.    _______________________________________________________________________ For Office Staff:  Number of people involved in session:  1 Was an Intern/ student involved with case: no

## 2022-05-06 ENCOUNTER — Ambulatory Visit
Admission: RE | Admit: 2022-05-06 | Discharge: 2022-05-06 | Disposition: A | Discharge status: Home / Self Care | Payer: Medicare Other | Source: Ambulatory Visit | Attending: Radiation Oncology | Admitting: Radiation Oncology

## 2022-05-06 ENCOUNTER — Other Ambulatory Visit: Payer: Self-pay

## 2022-05-06 DIAGNOSIS — Z170 Estrogen receptor positive status [ER+]: Secondary | ICD-10-CM | POA: Diagnosis not present

## 2022-05-06 DIAGNOSIS — C50412 Malignant neoplasm of upper-outer quadrant of left female breast: Secondary | ICD-10-CM | POA: Diagnosis not present

## 2022-05-06 DIAGNOSIS — Z510 Encounter for antineoplastic radiation therapy: Secondary | ICD-10-CM | POA: Diagnosis not present

## 2022-05-06 LAB — RAD ONC ARIA SESSION SUMMARY
Course Elapsed Days: 7
Plan Fractions Treated to Date: 6
Plan Prescribed Dose Per Fraction: 2.67 Gy
Plan Total Fractions Prescribed: 15
Plan Total Prescribed Dose: 40.05 Gy
Reference Point Dosage Given to Date: 16.02 Gy
Reference Point Session Dosage Given: 2.67 Gy
Session Number: 6

## 2022-05-07 ENCOUNTER — Other Ambulatory Visit: Payer: Self-pay

## 2022-05-07 ENCOUNTER — Ambulatory Visit
Admission: RE | Admit: 2022-05-07 | Discharge: 2022-05-07 | Disposition: A | Discharge status: Home / Self Care | Payer: Medicare Other | Source: Ambulatory Visit | Attending: Radiation Oncology | Admitting: Radiation Oncology

## 2022-05-07 DIAGNOSIS — Z510 Encounter for antineoplastic radiation therapy: Secondary | ICD-10-CM | POA: Diagnosis not present

## 2022-05-07 DIAGNOSIS — C50412 Malignant neoplasm of upper-outer quadrant of left female breast: Secondary | ICD-10-CM | POA: Diagnosis not present

## 2022-05-07 DIAGNOSIS — Z170 Estrogen receptor positive status [ER+]: Secondary | ICD-10-CM | POA: Diagnosis not present

## 2022-05-07 LAB — RAD ONC ARIA SESSION SUMMARY
Course Elapsed Days: 8
Plan Fractions Treated to Date: 7
Plan Prescribed Dose Per Fraction: 2.67 Gy
Plan Total Fractions Prescribed: 15
Plan Total Prescribed Dose: 40.05 Gy
Reference Point Dosage Given to Date: 18.69 Gy
Reference Point Session Dosage Given: 2.67 Gy
Session Number: 7

## 2022-05-09 ENCOUNTER — Encounter: Payer: Self-pay | Admitting: Hematology and Oncology

## 2022-05-09 NOTE — Assessment & Plan Note (Signed)
Julie Pugh is a 66 year old woman with HER2 positive breast cancer here today for evaluation prior to receiving Herceptin therapy.  She will continue to receive this every 3 weeks for a total of 1 year.  She will also continue taking letrozole daily.  Her most recent echo was completed on April 05, 2022 and was normal.  She will be due for repeat in September 2023.  Julie Pugh will continue to receive Herceptin every 3 weeks and we will continue to see her with every other treatment.

## 2022-05-10 ENCOUNTER — Other Ambulatory Visit: Payer: Self-pay

## 2022-05-10 ENCOUNTER — Ambulatory Visit
Admission: RE | Admit: 2022-05-10 | Discharge: 2022-05-10 | Disposition: A | Discharge status: Home / Self Care | Payer: Medicare Other | Source: Ambulatory Visit | Attending: Radiation Oncology | Admitting: Radiation Oncology

## 2022-05-10 ENCOUNTER — Ambulatory Visit: Payer: Medicare Other

## 2022-05-10 DIAGNOSIS — Z510 Encounter for antineoplastic radiation therapy: Secondary | ICD-10-CM | POA: Diagnosis not present

## 2022-05-10 DIAGNOSIS — C50412 Malignant neoplasm of upper-outer quadrant of left female breast: Secondary | ICD-10-CM | POA: Diagnosis not present

## 2022-05-10 DIAGNOSIS — Z170 Estrogen receptor positive status [ER+]: Secondary | ICD-10-CM | POA: Diagnosis not present

## 2022-05-10 LAB — RAD ONC ARIA SESSION SUMMARY
Course Elapsed Days: 11
Plan Fractions Treated to Date: 8
Plan Prescribed Dose Per Fraction: 2.67 Gy
Plan Total Fractions Prescribed: 15
Plan Total Prescribed Dose: 40.05 Gy
Reference Point Dosage Given to Date: 21.36 Gy
Reference Point Session Dosage Given: 2.67 Gy
Session Number: 8

## 2022-05-11 ENCOUNTER — Other Ambulatory Visit: Payer: Self-pay

## 2022-05-11 ENCOUNTER — Ambulatory Visit
Admission: RE | Admit: 2022-05-11 | Discharge: 2022-05-11 | Disposition: A | Discharge status: Home / Self Care | Payer: Medicare Other | Source: Ambulatory Visit | Attending: Radiation Oncology | Admitting: Radiation Oncology

## 2022-05-11 DIAGNOSIS — Z510 Encounter for antineoplastic radiation therapy: Secondary | ICD-10-CM | POA: Insufficient documentation

## 2022-05-11 DIAGNOSIS — Z170 Estrogen receptor positive status [ER+]: Secondary | ICD-10-CM | POA: Diagnosis not present

## 2022-05-11 DIAGNOSIS — C50412 Malignant neoplasm of upper-outer quadrant of left female breast: Secondary | ICD-10-CM | POA: Insufficient documentation

## 2022-05-11 LAB — RAD ONC ARIA SESSION SUMMARY
Course Elapsed Days: 12
Plan Fractions Treated to Date: 9
Plan Prescribed Dose Per Fraction: 2.67 Gy
Plan Total Fractions Prescribed: 15
Plan Total Prescribed Dose: 40.05 Gy
Reference Point Dosage Given to Date: 24.03 Gy
Reference Point Session Dosage Given: 2.67 Gy
Session Number: 9

## 2022-05-12 ENCOUNTER — Other Ambulatory Visit: Payer: Self-pay

## 2022-05-12 ENCOUNTER — Ambulatory Visit
Admission: RE | Admit: 2022-05-12 | Discharge: 2022-05-12 | Disposition: A | Discharge status: Home / Self Care | Payer: Medicare Other | Source: Ambulatory Visit | Attending: Radiation Oncology | Admitting: Radiation Oncology

## 2022-05-12 DIAGNOSIS — Z510 Encounter for antineoplastic radiation therapy: Secondary | ICD-10-CM | POA: Diagnosis not present

## 2022-05-12 DIAGNOSIS — C50412 Malignant neoplasm of upper-outer quadrant of left female breast: Secondary | ICD-10-CM | POA: Diagnosis not present

## 2022-05-12 DIAGNOSIS — Z170 Estrogen receptor positive status [ER+]: Secondary | ICD-10-CM | POA: Diagnosis not present

## 2022-05-12 LAB — RAD ONC ARIA SESSION SUMMARY
Course Elapsed Days: 13
Plan Fractions Treated to Date: 10
Plan Prescribed Dose Per Fraction: 2.67 Gy
Plan Total Fractions Prescribed: 15
Plan Total Prescribed Dose: 40.05 Gy
Reference Point Dosage Given to Date: 26.7 Gy
Reference Point Session Dosage Given: 2.67 Gy
Session Number: 10

## 2022-05-13 ENCOUNTER — Other Ambulatory Visit: Payer: Self-pay

## 2022-05-13 ENCOUNTER — Ambulatory Visit
Admission: RE | Admit: 2022-05-13 | Discharge: 2022-05-13 | Disposition: A | Discharge status: Home / Self Care | Payer: Medicare Other | Source: Ambulatory Visit | Attending: Radiation Oncology | Admitting: Radiation Oncology

## 2022-05-13 DIAGNOSIS — Z510 Encounter for antineoplastic radiation therapy: Secondary | ICD-10-CM | POA: Diagnosis not present

## 2022-05-13 DIAGNOSIS — Z170 Estrogen receptor positive status [ER+]: Secondary | ICD-10-CM | POA: Diagnosis not present

## 2022-05-13 DIAGNOSIS — C50412 Malignant neoplasm of upper-outer quadrant of left female breast: Secondary | ICD-10-CM | POA: Diagnosis not present

## 2022-05-13 LAB — RAD ONC ARIA SESSION SUMMARY
Course Elapsed Days: 14
Plan Fractions Treated to Date: 11
Plan Prescribed Dose Per Fraction: 2.67 Gy
Plan Total Fractions Prescribed: 15
Plan Total Prescribed Dose: 40.05 Gy
Reference Point Dosage Given to Date: 29.37 Gy
Reference Point Session Dosage Given: 2.67 Gy
Session Number: 11

## 2022-05-14 ENCOUNTER — Ambulatory Visit
Admission: RE | Admit: 2022-05-14 | Discharge: 2022-05-14 | Disposition: A | Discharge status: Home / Self Care | Payer: Medicare Other | Source: Ambulatory Visit | Attending: Radiation Oncology | Admitting: Radiation Oncology

## 2022-05-14 ENCOUNTER — Other Ambulatory Visit: Payer: Self-pay

## 2022-05-14 DIAGNOSIS — C50412 Malignant neoplasm of upper-outer quadrant of left female breast: Secondary | ICD-10-CM | POA: Diagnosis not present

## 2022-05-14 DIAGNOSIS — Z510 Encounter for antineoplastic radiation therapy: Secondary | ICD-10-CM | POA: Diagnosis not present

## 2022-05-14 DIAGNOSIS — Z170 Estrogen receptor positive status [ER+]: Secondary | ICD-10-CM | POA: Diagnosis not present

## 2022-05-14 LAB — RAD ONC ARIA SESSION SUMMARY
Course Elapsed Days: 15
Plan Fractions Treated to Date: 12
Plan Prescribed Dose Per Fraction: 2.67 Gy
Plan Total Fractions Prescribed: 15
Plan Total Prescribed Dose: 40.05 Gy
Reference Point Dosage Given to Date: 32.04 Gy
Reference Point Session Dosage Given: 2.67 Gy
Session Number: 12

## 2022-05-17 ENCOUNTER — Ambulatory Visit
Admission: RE | Admit: 2022-05-17 | Discharge: 2022-05-17 | Disposition: A | Discharge status: Home / Self Care | Payer: Medicare Other | Source: Ambulatory Visit | Attending: Radiation Oncology | Admitting: Radiation Oncology

## 2022-05-17 ENCOUNTER — Other Ambulatory Visit: Payer: Self-pay

## 2022-05-17 DIAGNOSIS — C50412 Malignant neoplasm of upper-outer quadrant of left female breast: Secondary | ICD-10-CM | POA: Diagnosis not present

## 2022-05-17 DIAGNOSIS — Z170 Estrogen receptor positive status [ER+]: Secondary | ICD-10-CM | POA: Diagnosis not present

## 2022-05-17 DIAGNOSIS — Z510 Encounter for antineoplastic radiation therapy: Secondary | ICD-10-CM | POA: Diagnosis not present

## 2022-05-17 LAB — RAD ONC ARIA SESSION SUMMARY
Course Elapsed Days: 18
Plan Fractions Treated to Date: 13
Plan Prescribed Dose Per Fraction: 2.67 Gy
Plan Total Fractions Prescribed: 15
Plan Total Prescribed Dose: 40.05 Gy
Reference Point Dosage Given to Date: 34.71 Gy
Reference Point Session Dosage Given: 2.67 Gy
Session Number: 13

## 2022-05-18 ENCOUNTER — Other Ambulatory Visit: Payer: Self-pay

## 2022-05-18 ENCOUNTER — Ambulatory Visit
Admission: RE | Admit: 2022-05-18 | Discharge: 2022-05-18 | Disposition: A | Discharge status: Home / Self Care | Payer: Medicare Other | Source: Ambulatory Visit | Attending: Radiation Oncology | Admitting: Radiation Oncology

## 2022-05-18 DIAGNOSIS — Z170 Estrogen receptor positive status [ER+]: Secondary | ICD-10-CM | POA: Diagnosis not present

## 2022-05-18 DIAGNOSIS — C50412 Malignant neoplasm of upper-outer quadrant of left female breast: Secondary | ICD-10-CM | POA: Diagnosis not present

## 2022-05-18 DIAGNOSIS — Z510 Encounter for antineoplastic radiation therapy: Secondary | ICD-10-CM | POA: Diagnosis not present

## 2022-05-18 LAB — RAD ONC ARIA SESSION SUMMARY
Course Elapsed Days: 19
Plan Fractions Treated to Date: 14
Plan Prescribed Dose Per Fraction: 2.67 Gy
Plan Total Fractions Prescribed: 15
Plan Total Prescribed Dose: 40.05 Gy
Reference Point Dosage Given to Date: 37.38 Gy
Reference Point Session Dosage Given: 2.67 Gy
Session Number: 14

## 2022-05-19 ENCOUNTER — Other Ambulatory Visit: Payer: Self-pay

## 2022-05-19 ENCOUNTER — Inpatient Hospital Stay: Payer: Medicare Other | Attending: Hematology and Oncology

## 2022-05-19 ENCOUNTER — Ambulatory Visit
Admission: RE | Admit: 2022-05-19 | Discharge: 2022-05-19 | Disposition: A | Discharge status: Home / Self Care | Payer: Medicare Other | Source: Ambulatory Visit | Attending: Radiation Oncology | Admitting: Radiation Oncology

## 2022-05-19 ENCOUNTER — Inpatient Hospital Stay: Payer: Medicare Other | Admitting: Hematology and Oncology

## 2022-05-19 VITALS — BP 155/85 | HR 65 | Temp 98.3°F | Resp 18 | Wt 154.0 lb

## 2022-05-19 DIAGNOSIS — Z5112 Encounter for antineoplastic immunotherapy: Secondary | ICD-10-CM | POA: Insufficient documentation

## 2022-05-19 DIAGNOSIS — Z79811 Long term (current) use of aromatase inhibitors: Secondary | ICD-10-CM | POA: Insufficient documentation

## 2022-05-19 DIAGNOSIS — Z79899 Other long term (current) drug therapy: Secondary | ICD-10-CM | POA: Insufficient documentation

## 2022-05-19 DIAGNOSIS — Z170 Estrogen receptor positive status [ER+]: Secondary | ICD-10-CM | POA: Insufficient documentation

## 2022-05-19 DIAGNOSIS — C50412 Malignant neoplasm of upper-outer quadrant of left female breast: Secondary | ICD-10-CM | POA: Diagnosis not present

## 2022-05-19 DIAGNOSIS — Z923 Personal history of irradiation: Secondary | ICD-10-CM | POA: Insufficient documentation

## 2022-05-19 DIAGNOSIS — Z510 Encounter for antineoplastic radiation therapy: Secondary | ICD-10-CM | POA: Diagnosis not present

## 2022-05-19 LAB — RAD ONC ARIA SESSION SUMMARY
Course Elapsed Days: 20
Plan Fractions Treated to Date: 15
Plan Prescribed Dose Per Fraction: 2.67 Gy
Plan Total Fractions Prescribed: 15
Plan Total Prescribed Dose: 40.05 Gy
Reference Point Dosage Given to Date: 40.05 Gy
Reference Point Session Dosage Given: 2.67 Gy
Session Number: 15

## 2022-05-19 MED ORDER — TRASTUZUMAB-HYALURONIDASE-OYSK 600-10000 MG-UNT/5ML ~~LOC~~ SOLN
600.0000 mg | Freq: Once | SUBCUTANEOUS | Status: AC
  Administered 2022-05-19: 600 mg via SUBCUTANEOUS
  Filled 2022-05-19: qty 5

## 2022-05-19 NOTE — Patient Instructions (Signed)
[facility]ICAL [facility]COLOG[facility]  Discharge Instructions: Thank you for choosing [facility] to provide your oncology and hematology care.   If you have a lab appointment with the Cancer Center, please go directly to the Cancer Center and check in at the registration area.   Wear comfortable clothing and clothing appropriate for easy access to any Portacath or PICC line.   We strive to give you quality time with your provider. You may need to reschedule your appointment if you arrive late (15 or more minutes).  Arriving late affects you and other patients whose appointments are after yours.  Also, if you miss three or more appointments without notifying the office, you may be dismissed from the clinic at the provider's discretion.      For prescription refill requests, have your pharmacy contact our office and allow 72 hours for refills to be completed.    Today you received the following chemotherapy and/or immunotherapy agents: herceptin hylecta      To help prevent nausea and vomiting after your treatment, we encourage you to take your nausea medication as directed.  BELOW ARE SYMPTOMS THAT SHOULD BE REPORTED IMMEDIATELY: *FEVER GREATER THAN 100.4 F (38 C) OR HIGHER *CHILLS OR SWEATING *NAUSEA AND VOMITING THAT IS NOT CONTROLLED WITH YOUR NAUSEA MEDICATION *UNUSUAL SHORTNESS OF BREATH *UNUSUAL BRUISING OR BLEEDING *URINARY PROBLEMS (pain or burning when urinating, or frequent urination) *BOWEL PROBLEMS (unusual diarrhea, constipation, pain near the anus) TENDERNESS IN MOUTH AND THROAT WITH OR WITHOUT PRESENCE OF ULCERS (sore throat, sores in mouth, or a toothache) UNUSUAL RASH, SWELLING OR PAIN  UNUSUAL VAGINAL DISCHARGE OR ITCHING   Items with * indicate a potential emergency and should be followed up as soon as possible or go to the Emergency Department if any problems should occur.  Please show the CHEMOTHERAPY ALERT CARD or IMMUNOTHERAPY ALERT CARD at  check-in to the Emergency Department and triage nurse.  Should you have questions after your visit or need to cancel or reschedule your appointment, please contact [facility]  Dept: [phone]  and follow the prompts.  Office hours are 8:00 a.m. to 4:30 p.m. Monday - Friday. Please note that voicemails left after 4:00 p.m. may not be returned until the following business day.  We are closed weekends and major holidays. You have access to a nurse at all times for urgent questions. Please call the main number to the clinic Dept: [phone] and follow the prompts.   For any non-urgent questions, you may also contact your provider using MyChart. We now offer e-Visits for anyone 18 and older to request care online for non-urgent symptoms. For details visit [email or web address].   Also download the MyChart app! Go to the app store, search "MyChart", open the app, select , and log in with your MyChart username and password.  Masks are optional in the cancer centers. If you would like for your care team to wear a mask while they are taking care of you, please let them know. You may have one support person who is at least 68 years old accompany you for your appointments. 

## 2022-05-20 ENCOUNTER — Telehealth: Payer: Self-pay | Admitting: Genetic Counselor

## 2022-05-20 ENCOUNTER — Ambulatory Visit
Admission: RE | Admit: 2022-05-20 | Discharge: 2022-05-20 | Disposition: A | Discharge status: Home / Self Care | Payer: Medicare Other | Source: Ambulatory Visit | Attending: Radiation Oncology | Admitting: Radiation Oncology

## 2022-05-20 ENCOUNTER — Encounter: Payer: Self-pay | Admitting: Genetic Counselor

## 2022-05-20 ENCOUNTER — Other Ambulatory Visit: Payer: Self-pay

## 2022-05-20 ENCOUNTER — Telehealth: Payer: Self-pay | Admitting: *Deleted

## 2022-05-20 ENCOUNTER — Ambulatory Visit: Payer: Self-pay | Admitting: Genetic Counselor

## 2022-05-20 DIAGNOSIS — Z1379 Encounter for other screening for genetic and chromosomal anomalies: Secondary | ICD-10-CM

## 2022-05-20 DIAGNOSIS — Z0000 Encounter for general adult medical examination without abnormal findings: Secondary | ICD-10-CM | POA: Insufficient documentation

## 2022-05-20 DIAGNOSIS — Z510 Encounter for antineoplastic radiation therapy: Secondary | ICD-10-CM | POA: Diagnosis not present

## 2022-05-20 DIAGNOSIS — Z170 Estrogen receptor positive status [ER+]: Secondary | ICD-10-CM | POA: Diagnosis not present

## 2022-05-20 DIAGNOSIS — C50412 Malignant neoplasm of upper-outer quadrant of left female breast: Secondary | ICD-10-CM | POA: Diagnosis not present

## 2022-05-20 LAB — RAD ONC ARIA SESSION SUMMARY
Course Elapsed Days: 21
Plan Fractions Treated to Date: 1
Plan Prescribed Dose Per Fraction: 2 Gy
Plan Total Fractions Prescribed: 5
Plan Total Prescribed Dose: 10 Gy
Reference Point Dosage Given to Date: 42.05 Gy
Reference Point Session Dosage Given: 2 Gy
Session Number: 16

## 2022-05-20 NOTE — Progress Notes (Signed)
HPI:  Ms. Duross was previously seen in the [place] clinic due to a personal and family history of cancer and concerns regarding a hereditary predisposition to cancer. Please refer to our prior cancer genetics clinic note for more information regarding our discussion, assessment and recommendations, at the time. Ms. Rabago recent genetic test results were disclosed to her, as were recommendations warranted by these results. These results and recommendations are discussed in more detail below.  CANCER HISTORY:  Oncology History  Malignant neoplasm of upper-outer quadrant of left breast in female, estrogen receptor positive ([place])  02/12/2022 Initial Diagnosis   Screening mammogram detected left breast mass, by ultrasound measured 0.6 cm, biopsy revealed grade 2 IDC ER 100%, PR 2%, HER2 equivocal by IHC and FISH positive, Ki-67 30%   02/17/2022 Cancer Staging   Staging form: Breast, AJCC 8th Edition - Clinical: Stage IA (cT1b, cN0, cM0, G2, ER+, PR-, HER2+) - Signed by Nicholas Lose, MD on 02/17/2022 Stage prefix: Initial diagnosis Histologic grading system: 3 grade system   03/16/2022 Surgery   Left lumpectomy: Grade 3 IDC 5 mm, negative for lymphovascular invasion, ER 100%, PR 2%, HER2 FISH positive, margins negative, 0/2 lymph nodes negative   03/29/2022 -  Anti-estrogen oral therapy   Letrozole daily   04/07/2022 -  Chemotherapy   Patient is on Treatment Plan : BREAST Trastuzumab q21d x 13 cycles     04/29/2022 - 05/26/2022 Radiation Therapy   Adjuvant Radiation therapy   05/05/2022 Genetic Testing   Referral pending   05/09/2022 Cancer Staging   Staging form: Breast, AJCC 8th Edition - Pathologic: Stage IA (pT1a, pN0, cM0, G3, ER+, PR-, HER2+) - Signed by Gardenia Phlegm, NP on 05/09/2022 Histologic grading system: 3 grade system   05/13/2022 Genetic Testing   Negative genetic testing on the CancerNext-Expanded+RNAinsight panel.  The report date is May 13, 2022.  The CancerNext-Expanded gene panel offered by [facility] and includes sequencing and rearrangement analysis for the following 77 genes: AIP, ALK, APC*, ATM*, AXIN2, BAP1, BARD1, BLM, BMPR1A, BRCA1*, BRCA2*, BRIP1*, CDC73, CDH1*, CDK4, CDKN1B, CDKN2A, CHEK2*, CTNNA1, DICER1, FANCC, FH, FLCN, GALNT12, KIF1B, LZTR1, MAX, MEN1, MET, MLH1*, MSH2*, MSH3, MSH6*, MUTYH*, NBN, NF1*, NF2, NTHL1, PALB2*, PHOX2B, PMS2*, POT1, PRKAR1A, PTCH1, PTEN*, RAD51C*, RAD51D*, RB1, RECQL, RET, SDHA, SDHAF2, SDHB, SDHC, SDHD, SMAD4, SMARCA4, SMARCB1, SMARCE1, STK11, SUFU, TMEM127, TP53*, TSC1, TSC2, VHL and XRCC2 (sequencing and deletion/duplication); EGFR, EGLN1, HOXB13, KIT, MITF, PDGFRA, POLD1, and POLE (sequencing only); EPCAM and GREM1 (deletion/duplication only). DNA and RNA analyses performed for * genes.      FAMILY HISTORY:  We obtained a detailed, 4-generation family history.  Significant diagnoses are listed below: Family History  Problem Relation Age of Onset   Panic disorder Mother    COPD Mother        smoked until 55   Congestive Heart Failure Mother        pacemaker   Hypertension Mother    Heart attack Father        bypass 25, 14 fatal MI, former smoker   Prostate cancer Father 58   Hyperlipidemia Sister    Breast cancer Maternal Aunt    Pancreatic cancer Cousin 71     The patient has one daughter who is cancer free.  She has one sister who is cancer free.  Both parents are deceased.     The patient's father had prostate cancer.  He had seven full and half siblings who are cancer free.  The paternal grandparents died of non cancer related issues.   The patient's mother had eight full and half siblings.  A half sister had breast cancer and another half sister had a daughter with pancreatic cancer.  There is no other reported family history of cancer.   Ms. Heidler is unaware of previous family history of genetic testing for hereditary cancer risks. Patient's maternal ancestors are  of [place] descent, and paternal ancestors are of [place] descent. There is no reported Ashkenazi Jewish ancestry. There is no known consanguinity.  GENETIC TEST RESULTS: Genetic testing reported out on May 13, 2022 through the CancerNext-Expanded+RNAinsight cancer panel found no pathogenic mutations. The CancerNext-Expanded gene panel offered by [facility] and includes sequencing and rearrangement analysis for the following 77 genes: AIP, ALK, APC*, ATM*, AXIN2, BAP1, BARD1, BLM, BMPR1A, BRCA1*, BRCA2*, BRIP1*, CDC73, CDH1*, CDK4, CDKN1B, CDKN2A, CHEK2*, CTNNA1, DICER1, FANCC, FH, FLCN, GALNT12, KIF1B, LZTR1, MAX, MEN1, MET, MLH1*, MSH2*, MSH3, MSH6*, MUTYH*, NBN, NF1*, NF2, NTHL1, PALB2*, PHOX2B, PMS2*, POT1, PRKAR1A, PTCH1, PTEN*, RAD51C*, RAD51D*, RB1, RECQL, RET, SDHA, SDHAF2, SDHB, SDHC, SDHD, SMAD4, SMARCA4, SMARCB1, SMARCE1, STK11, SUFU, TMEM127, TP53*, TSC1, TSC2, VHL and XRCC2 (sequencing and deletion/duplication); EGFR, EGLN1, HOXB13, KIT, MITF, PDGFRA, POLD1, and POLE (sequencing only); EPCAM and GREM1 (deletion/duplication only). DNA and RNA analyses performed for * genes. The test report has been scanned into EPIC and is located under the Molecular Pathology section of the Results Review tab.  A portion of the result report is included below for reference.     We discussed with Ms. Reasons that because current genetic testing is not perfect, it is possible there may be a gene mutation in one of these genes that current testing cannot detect, but that chance is small.  We also discussed, that there could be another gene that has not yet been discovered, or that we have not yet tested, that is responsible for the cancer diagnoses in the family. It is also possible there is a hereditary cause for the cancer in the family that Ms. Hattabaugh did not inherit and therefore was not identified in her testing.  Therefore, it is important to remain in touch with cancer genetics in the future so that  we can continue to offer Ms. Filippone the most up to date genetic testing.   ADDITIONAL GENETIC TESTING: We discussed with Ms. Egnew that her genetic testing was fairly extensive.  If there are genes identified to increase cancer risk that can be analyzed in the future, we would be happy to discuss and coordinate this testing at that time.    CANCER SCREENING RECOMMENDATIONS: Ms. Stivers test result is considered negative (normal).  This means that we have not identified a hereditary cause for her personal and family history of cancer at this time. Most cancers happen by chance and this negative test suggests that her cancer may fall into this category.    While reassuring, this does not definitively rule out a hereditary predisposition to cancer. It is still possible that there could be genetic mutations that are undetectable by current technology. There could be genetic mutations in genes that have not been tested or identified to increase cancer risk.  Therefore, it is recommended she continue to follow the cancer management and screening guidelines provided by her oncology and primary healthcare provider.   An individual's cancer risk and medical management are not determined by genetic test results alone. Overall cancer risk assessment incorporates additional factors, including personal medical history, family history, and any  available genetic information that may result in a personalized plan for cancer prevention and surveillance  RECOMMENDATIONS FOR FAMILY MEMBERS:  Individuals in this family might be at some increased risk of developing cancer, over the general population risk, simply due to the family history of cancer.  We recommended women in this family have a yearly mammogram beginning at age 65, or 94 years younger than the earliest onset of cancer, an annual clinical breast exam, and perform monthly breast self-exams. Women in this family should also have a gynecological exam as  recommended by their primary provider. All family members should be referred for colonoscopy starting at age 69.  FOLLOW-UP: Lastly, we discussed with Ms. Waguespack that cancer genetics is a rapidly advancing field and it is possible that new genetic tests will be appropriate for her and/or her family members in the future. We encouraged her to remain in contact with cancer genetics on an annual basis so we can update her personal and family histories and let her know of advances in cancer genetics that may benefit this family.   Our contact number was provided. Ms. Preiss questions were answered to her satisfaction, and she knows she is welcome to call us at anytime with additional questions or concerns.   Roma Kayser, [place], [facility], [place] Licensed, Certified Genetic Counselor [email or web address]

## 2022-05-20 NOTE — Telephone Encounter (Signed)
Received call from pt with complaint of right thigh pain post subcu trastuzumab injection yesterday.  Pt states after injection she went shopping and then on a 3.5 mile walk.  Pt states towards the end of the walk is when the pain and discomfort started.  Pt denies redness, swelling, warmth to the touch or fever at this time.  Per MD pt may have irritated injection site with exercising.  Pt educated to apply cool compress and take OTC tylenol for discomfort.  Pt educated to contact the office if symptoms become worse. Pt verbalized understanding.

## 2022-05-20 NOTE — Telephone Encounter (Signed)
Revealed negative genetic testing.  Discussed that we do not know why she has melanoma and breast cancer or why there is cancer in the family. It could be due to a different gene that we are not testing, or maybe our current technology may not be able to pick something up.  It will be important for her to keep in contact with genetics to keep up with whether additional testing may be needed.

## 2022-05-21 ENCOUNTER — Ambulatory Visit
Admission: RE | Admit: 2022-05-21 | Discharge: 2022-05-21 | Disposition: A | Discharge status: Home / Self Care | Payer: Medicare Other | Source: Ambulatory Visit | Attending: Radiation Oncology | Admitting: Radiation Oncology

## 2022-05-21 ENCOUNTER — Other Ambulatory Visit: Payer: Self-pay

## 2022-05-21 DIAGNOSIS — Z510 Encounter for antineoplastic radiation therapy: Secondary | ICD-10-CM | POA: Diagnosis not present

## 2022-05-21 DIAGNOSIS — C50412 Malignant neoplasm of upper-outer quadrant of left female breast: Secondary | ICD-10-CM | POA: Diagnosis not present

## 2022-05-21 DIAGNOSIS — Z170 Estrogen receptor positive status [ER+]: Secondary | ICD-10-CM | POA: Diagnosis not present

## 2022-05-21 LAB — RAD ONC ARIA SESSION SUMMARY
Course Elapsed Days: 22
Plan Fractions Treated to Date: 2
Plan Prescribed Dose Per Fraction: 2 Gy
Plan Total Fractions Prescribed: 5
Plan Total Prescribed Dose: 10 Gy
Reference Point Dosage Given to Date: 44.05 Gy
Reference Point Session Dosage Given: 2 Gy
Session Number: 17

## 2022-05-24 ENCOUNTER — Other Ambulatory Visit: Payer: Self-pay

## 2022-05-24 ENCOUNTER — Ambulatory Visit: Payer: Medicare Other

## 2022-05-24 ENCOUNTER — Ambulatory Visit
Admission: RE | Admit: 2022-05-24 | Discharge: 2022-05-24 | Disposition: A | Discharge status: Home / Self Care | Payer: Medicare Other | Source: Ambulatory Visit | Attending: Radiation Oncology | Admitting: Radiation Oncology

## 2022-05-24 DIAGNOSIS — Z510 Encounter for antineoplastic radiation therapy: Secondary | ICD-10-CM | POA: Diagnosis not present

## 2022-05-24 DIAGNOSIS — C50412 Malignant neoplasm of upper-outer quadrant of left female breast: Secondary | ICD-10-CM | POA: Diagnosis not present

## 2022-05-24 DIAGNOSIS — Z170 Estrogen receptor positive status [ER+]: Secondary | ICD-10-CM | POA: Diagnosis not present

## 2022-05-24 LAB — RAD ONC ARIA SESSION SUMMARY
Course Elapsed Days: 25
Plan Fractions Treated to Date: 3
Plan Prescribed Dose Per Fraction: 2 Gy
Plan Total Fractions Prescribed: 5
Plan Total Prescribed Dose: 10 Gy
Reference Point Dosage Given to Date: 46.05 Gy
Reference Point Session Dosage Given: 2 Gy
Session Number: 18

## 2022-05-24 NOTE — Telephone Encounter (Signed)
error 

## 2022-05-25 ENCOUNTER — Encounter: Payer: Self-pay | Admitting: *Deleted

## 2022-05-25 ENCOUNTER — Ambulatory Visit
Admission: RE | Admit: 2022-05-25 | Discharge: 2022-05-25 | Disposition: A | Discharge status: Home / Self Care | Payer: Medicare Other | Source: Ambulatory Visit | Attending: Radiation Oncology | Admitting: Radiation Oncology

## 2022-05-25 ENCOUNTER — Other Ambulatory Visit: Payer: Self-pay

## 2022-05-25 DIAGNOSIS — C50412 Malignant neoplasm of upper-outer quadrant of left female breast: Secondary | ICD-10-CM | POA: Diagnosis not present

## 2022-05-25 DIAGNOSIS — Z170 Estrogen receptor positive status [ER+]: Secondary | ICD-10-CM | POA: Diagnosis not present

## 2022-05-25 DIAGNOSIS — Z510 Encounter for antineoplastic radiation therapy: Secondary | ICD-10-CM | POA: Diagnosis not present

## 2022-05-25 LAB — RAD ONC ARIA SESSION SUMMARY
Course Elapsed Days: 26
Plan Fractions Treated to Date: 4
Plan Prescribed Dose Per Fraction: 2 Gy
Plan Total Fractions Prescribed: 5
Plan Total Prescribed Dose: 10 Gy
Reference Point Dosage Given to Date: 48.05 Gy
Reference Point Session Dosage Given: 2 Gy
Session Number: 19

## 2022-05-26 ENCOUNTER — Encounter: Payer: Self-pay | Admitting: Radiation Oncology

## 2022-05-26 ENCOUNTER — Ambulatory Visit
Admission: RE | Admit: 2022-05-26 | Discharge: 2022-05-26 | Disposition: A | Discharge status: Home / Self Care | Payer: Medicare Other | Source: Ambulatory Visit | Attending: Radiation Oncology | Admitting: Radiation Oncology

## 2022-05-26 ENCOUNTER — Other Ambulatory Visit: Payer: Self-pay

## 2022-05-26 DIAGNOSIS — Z510 Encounter for antineoplastic radiation therapy: Secondary | ICD-10-CM | POA: Diagnosis not present

## 2022-05-26 DIAGNOSIS — Z170 Estrogen receptor positive status [ER+]: Secondary | ICD-10-CM | POA: Diagnosis not present

## 2022-05-26 DIAGNOSIS — C50412 Malignant neoplasm of upper-outer quadrant of left female breast: Secondary | ICD-10-CM | POA: Diagnosis not present

## 2022-05-26 LAB — RAD ONC ARIA SESSION SUMMARY
Course Elapsed Days: 27
Plan Fractions Treated to Date: 5
Plan Prescribed Dose Per Fraction: 2 Gy
Plan Total Fractions Prescribed: 5
Plan Total Prescribed Dose: 10 Gy
Reference Point Dosage Given to Date: 50.05 Gy
Reference Point Session Dosage Given: 2 Gy
Session Number: 20

## 2022-06-09 ENCOUNTER — Other Ambulatory Visit: Payer: Self-pay

## 2022-06-09 ENCOUNTER — Inpatient Hospital Stay: Payer: Medicare Other | Admitting: Hematology and Oncology

## 2022-06-09 ENCOUNTER — Inpatient Hospital Stay: Payer: Medicare Other

## 2022-06-09 VITALS — BP 160/77 | HR 67 | Temp 97.2°F | Resp 18 | Ht 67.0 in | Wt 155.6 lb

## 2022-06-09 VITALS — BP 142/79 | HR 60 | Temp 97.8°F | Resp 18

## 2022-06-09 DIAGNOSIS — Z170 Estrogen receptor positive status [ER+]: Secondary | ICD-10-CM | POA: Diagnosis not present

## 2022-06-09 DIAGNOSIS — Z5181 Encounter for therapeutic drug level monitoring: Secondary | ICD-10-CM | POA: Diagnosis not present

## 2022-06-09 DIAGNOSIS — Z79811 Long term (current) use of aromatase inhibitors: Secondary | ICD-10-CM | POA: Diagnosis not present

## 2022-06-09 DIAGNOSIS — Z5112 Encounter for antineoplastic immunotherapy: Secondary | ICD-10-CM | POA: Insufficient documentation

## 2022-06-09 DIAGNOSIS — Z923 Personal history of irradiation: Secondary | ICD-10-CM | POA: Insufficient documentation

## 2022-06-09 DIAGNOSIS — C50412 Malignant neoplasm of upper-outer quadrant of left female breast: Secondary | ICD-10-CM | POA: Insufficient documentation

## 2022-06-09 DIAGNOSIS — Z79899 Other long term (current) drug therapy: Secondary | ICD-10-CM | POA: Diagnosis not present

## 2022-06-09 MED ORDER — TRASTUZUMAB-DKST CHEMO 150 MG IV SOLR: 6.0000 mg/kg | Freq: Once | INTRAVENOUS | Status: DC

## 2022-06-09 MED ORDER — ACETAMINOPHEN 325 MG PO TABS: 650.0000 mg | ORAL_TABLET | Freq: Once | ORAL | Status: DC

## 2022-06-09 MED ORDER — DIPHENHYDRAMINE HCL 25 MG PO CAPS: 25.0000 mg | ORAL_CAPSULE | Freq: Once | ORAL | Status: DC

## 2022-06-09 MED ORDER — TRASTUZUMAB-HYALURONIDASE-OYSK 600-10000 MG-UNT/5ML ~~LOC~~ SOLN
600.0000 mg | Freq: Once | SUBCUTANEOUS | Status: AC
  Administered 2022-06-09: 600 mg via SUBCUTANEOUS
  Filled 2022-06-09: qty 5

## 2022-06-09 NOTE — Progress Notes (Signed)
Patient doing well with her treatments- no tylenol or benadryl pre-treatment per Dr. Lindi Adie. Patient did not want to stay for any observation. VSS- BP (!) 142/79 (BP Location: Right Arm, Patient Position: Sitting)   Pulse 60   Temp 97.8 F (36.6 C) (Oral)   Resp 18   SpO2 100%

## 2022-06-09 NOTE — Assessment & Plan Note (Addendum)
02/12/2022:Screening mammogram detected left breast mass, by ultrasound measured 0.6 cm, biopsy revealed grade 2 IDC ER 100%, PR 2%, HER2 equivocal by IHC and FISH positive, Ki-67 30%   Treatment plan: 1.03/16/2022 Left lumpectomy: Grade 3 IDC 5 mm (6 mm on biopsy), negative for lymphovascular invasion, ER 100%, PR 2%, HER2 FISH positive, margins negative, 0/2 lymph nodes negative 2. based on only 6 mm tumor size, we discussed the pros and cons of systemic treatments and decided to treat her with Herceptin with letrozole. 3.Adjuvant radiation completed 05/26/2022 4.Followed by adjuvant antiestrogen therapy ---------------------------------------------------------------------------- Current treatment: Herceptin started 04/07/2022 with letrozole Herceptin toxicities:  Letrozole toxicities:  Return to clinic every 3 weeks for Herceptin injections every 6 weeks and follow-up with me.

## 2022-06-09 NOTE — Progress Notes (Signed)
Patient Care Team: [name], MD as PCP - General (Family Medicine) Philemon Kingdom, MD as Consulting Physician (Internal Medicine) Nicholas Lose, MD as Medical Oncologist (Hematology and Oncology)  DIAGNOSIS:  Encounter Diagnoses  Name Primary?   Malignant neoplasm of upper-outer quadrant of left breast in female, estrogen receptor positive ([place])    Encounter for monitoring cardiotoxic drug therapy Yes    SUMMARY OF ONCOLOGIC HISTORY: Oncology History  Malignant neoplasm of upper-outer quadrant of left breast in female, estrogen receptor positive ([place] P[place])  02/12/2022 Initial Diagnosis   Screening mammogram detected left breast mass, by ultrasound measured 0.6 cm, biopsy revealed grade 2 IDC ER 100%, PR 2%, HER2 equivocal by IHC and FISH positive, Ki-67 30%   02/17/2022 Cancer Staging   Staging form: Breast, AJCC 8th Edition - Clinical: Stage IA (cT1b, cN0, cM0, G2, ER+, PR-, HER2+) - Signed by Nicholas Lose, MD on 02/17/2022 Stage prefix: Initial diagnosis Histologic grading system: 3 grade system   03/16/2022 Surgery   Left lumpectomy: Grade 3 IDC 5 mm, negative for lymphovascular invasion, ER 100%, PR 2%, HER2 FISH positive, margins negative, 0/2 lymph nodes negative   03/29/2022 -  Anti-estrogen oral therapy   Letrozole daily   04/07/2022 - 05/19/2022 Chemotherapy   Patient is on Treatment Plan : BREAST Trastuzumab q21d x 13 cycles     04/07/2022 -  Chemotherapy   Patient is on Treatment Plan : BREAST Trastuzumab IV (8/6) or SQ (600) D1 q21d     04/29/2022 - 05/26/2022 Radiation Therapy   Adjuvant Radiation therapy   05/05/2022 Genetic Testing   Referral pending   05/09/2022 Cancer Staging   Staging form: Breast, AJCC 8th Edition - Pathologic: Stage IA (pT1a, pN0, cM0, G3, ER+, PR-, HER2+) - Signed by Gardenia Phlegm, NP on 05/09/2022 Histologic grading system: 3 grade system   05/13/2022 Genetic Testing   Negative genetic testing on the  CancerNext-Expanded+RNAinsight panel.  The report date is May 13, 2022.  The CancerNext-Expanded gene panel offered by [facility] and includes sequencing and rearrangement analysis for the following 77 genes: AIP, ALK, APC*, ATM*, AXIN2, BAP1, BARD1, BLM, BMPR1A, BRCA1*, BRCA2*, BRIP1*, CDC73, CDH1*, CDK4, CDKN1B, CDKN2A, CHEK2*, CTNNA1, DICER1, FANCC, FH, FLCN, GALNT12, KIF1B, LZTR1, MAX, MEN1, MET, MLH1*, MSH2*, MSH3, MSH6*, MUTYH*, NBN, NF1*, NF2, NTHL1, PALB2*, PHOX2B, PMS2*, POT1, PRKAR1A, PTCH1, PTEN*, RAD51C*, RAD51D*, RB1, RECQL, RET, SDHA, SDHAF2, SDHB, SDHC, SDHD, SMAD4, SMARCA4, SMARCB1, SMARCE1, STK11, SUFU, TMEM127, TP53*, TSC1, TSC2, VHL and XRCC2 (sequencing and deletion/duplication); EGFR, EGLN1, HOXB13, KIT, MITF, PDGFRA, POLD1, and POLE (sequencing only); EPCAM and GREM1 (deletion/duplication only). DNA and RNA analyses performed for * genes.      CHIEF COMPLIANT: Follow-up left lumpectomy on herceptin and letrozole  INTERVAL HISTORY: Brandan Pugh is a 66 y.o with the above mention left lumpectomy on herceptin and letrozole. She presents to the clinic today for a follow-up. She states that the treatment and injection is going well. Denies diarrhea and hot flashes. She complains of a dull headache.   ALLERGIES:  has No Known Allergies.  MEDICATIONS:  Current Outpatient Medications  Medication Sig Dispense Refill   amLODipine (NORVASC) 2.5 MG tablet Take 1 tablet (2.5 mg total) by mouth daily. 90 tablet 1   Bioflavonoid Products (ESTER-C) 500-200-60 MG TABS Take by mouth.     Cholecalciferol (VITAMIN D) 2000 units tablet Take 5,000 Units by mouth daily.     letrozole (FEMARA) 2.5 MG tablet Take 1 tablet (2.5 mg total) by mouth daily.  90 tablet 3   levothyroxine (SYNTHROID) 100 MCG tablet TAKE 1 TABLET(100 MCG) BY MOUTH DAILY 90 tablet 3   METRONIDAZOLE, TOPICAL, 0.75 % LOTN APP TO FACE AT NIGHT UTD (Patient not taking: Reported on 04/21/2022)     Multiple  Vitamins-Minerals (CENTRUM SILVER 50+WOMEN PO) Take by mouth.     No current facility-administered medications for this visit.    PHYSICAL EXAMINATION: ECOG PERFORMANCE STATUS: 1 - Symptomatic but completely ambulatory  Vitals:   06/09/22 1018  BP: (!) 160/77  Pulse: 67  Resp: 18  Temp: (!) 97.2 F (36.2 C)  SpO2: 100%   Filed Weights   06/09/22 1018  Weight: 155 lb 9.6 oz (70.6 kg)      LABORATORY DATA:  I have reviewed the data as listed    Latest Ref Rng & Units 10/22/2021    1:21 PM 10/08/2020    9:07 AM 06/13/2020    8:30 AM  CMP  Glucose 70 - 99 mg/dL 87  89  79   BUN 6 - 23 mg/dL 20  18  21    Creatinine 0.40 - 1.20 mg/dL 0.95  0.87  0.91   Sodium 135 - 145 mEq/L 140  140  141   Potassium 3.5 - 5.1 mEq/L 4.2  4.4  4.3   Chloride 96 - 112 mEq/L 105  105  105   CO2 19 - 32 mEq/L 29  27  27    Calcium 8.4 - 10.5 mg/dL 9.4  9.4  9.2   Total Protein 6.0 - 8.3 g/dL 7.2  7.0  6.3   Total Bilirubin 0.2 - 1.2 mg/dL 0.5  0.7  0.6   Alkaline Phos 39 - 117 U/L 60  65  53   AST 0 - 37 U/L 20  16  13    ALT 0 - 35 U/L 17  16  12      Lab Results  Component Value Date   WBC 5.9 10/22/2021   HGB 14.5 10/22/2021   HCT 44.1 10/22/2021   MCV 90.0 10/22/2021   PLT 297.0 10/22/2021   NEUTROABS 3.1 10/22/2021    ASSESSMENT & PLAN:  Malignant neoplasm of upper-outer quadrant of left breast in female, estrogen receptor positive ([place] P[place]) 02/12/2022:Screening mammogram detected left breast mass, by ultrasound measured 0.6 cm, biopsy revealed grade 2 IDC ER 100%, PR 2%, HER2 equivocal by IHC and FISH positive, Ki-67 30%     Treatment plan: 1.  03/16/2022 Left lumpectomy: Grade 3 IDC 5 mm (6 mm on biopsy), negative for lymphovascular invasion, ER 100%, PR 2%, HER2 FISH positive, margins negative, 0/2 lymph nodes negative 2.  based on only 6 mm tumor size, we discussed the pros and cons of systemic treatments and decided to treat her with Herceptin with letrozole. 3.  Adjuvant radiation  completed 05/26/2022 4.  Followed by adjuvant antiestrogen therapy ---------------------------------------------------------------------------- Current treatment: Herceptin started 04/07/2022 with letrozole Herceptin toxicities: Denies any adverse effects  Letrozole toxicities: Denies any hot flashes or arthralgias [place]. Return to clinic every 3 weeks for Herceptin injections every 6 weeks and follow-up with me.    Orders Placed This Encounter  Procedures   ECHOCARDIOGRAM COMPLETE    Standing Status:   Future    Standing Expiration Date:   06/10/2023    Order Specific Question:   Where should this test be performed    Answer:   [facility]    Order Specific Question:   Perflutren DEFINITY (image enhancing  agent) should be administered unless hypersensitivity or allergy exist    Answer:   Administer Perflutren    Order Specific Question:   Reason for exam-Echo    Answer:   Chemo  [id number]    Order Specific Question:   Other Comments    Answer:   call pt. to get autherization   The patient has a good understanding of the overall plan. she agrees with it. she will call with any problems that may develop before the next visit here. Total time spent: 30 mins including face to face time and time spent for planning, charting and co-ordination of care   Harriette Ohara, MD 06/09/22    I Gardiner Coins am scribing for Dr. Lindi Adie  I have reviewed the above documentation for accuracy and completeness, and I agree with the above.

## 2022-06-09 NOTE — Patient Instructions (Signed)
[facility] ONCOLOGY  Discharge Instructions: Thank you for choosing [facility] to provide your oncology and hematology care.   If you have a lab appointment with the [facility], please go directly to the [facility] and check in at the registration area.   Wear comfortable clothing and clothing appropriate for easy access to any Portacath or PICC line.   We strive to give you quality time with your provider. You may need to reschedule your appointment if you arrive late (15 or more minutes).  Arriving late affects you and other patients whose appointments are after yours.  Also, if you miss three or more appointments without notifying the office, you may be dismissed from the clinic at the provider's discretion.      For prescription refill requests, have your pharmacy contact our office and allow 72 hours for refills to be completed.    Today you received the following chemotherapy and/or immunotherapy agents: Herceptin hylecta      To help prevent nausea and vomiting after your treatment, we encourage you to take your nausea medication as directed.  BELOW ARE SYMPTOMS THAT SHOULD BE REPORTED IMMEDIATELY: *FEVER GREATER THAN 100.4 F (38 C) OR HIGHER *CHILLS OR SWEATING *NAUSEA AND VOMITING THAT IS NOT CONTROLLED WITH YOUR NAUSEA MEDICATION *UNUSUAL SHORTNESS OF BREATH *UNUSUAL BRUISING OR BLEEDING *URINARY PROBLEMS (pain or burning when urinating, or frequent urination) *BOWEL PROBLEMS (unusual diarrhea, constipation, pain near the anus) TENDERNESS IN MOUTH AND THROAT WITH OR WITHOUT PRESENCE OF ULCERS (sore throat, sores in mouth, or a toothache) UNUSUAL RASH, SWELLING OR PAIN  UNUSUAL VAGINAL DISCHARGE OR ITCHING   Items with * indicate a potential emergency and should be followed up as soon as possible or go to the Emergency Department if any problems should occur.  Please show the CHEMOTHERAPY ALERT CARD or IMMUNOTHERAPY ALERT CARD at  check-in to the Emergency Department and triage nurse.  Should you have questions after your visit or need to cancel or reschedule your appointment, please contact [facility]  Dept: [phone]  and follow the prompts.  Office hours are 8:00 a.m. to 4:30 p.m. Monday - Friday. Please note that voicemails left after 4:00 p.m. may not be returned until the following business day.  We are closed weekends and major holidays. You have access to a nurse at all times for urgent questions. Please call the main number to the clinic Dept: [phone] and follow the prompts.   For any non-urgent questions, you may also contact your provider using MyChart. We now offer e-Visits for anyone 90 and older to request care online for non-urgent symptoms. For details visit [email or web address].   Also download the MyChart app! Go to the app store, search "MyChart", open the app, select [facility], and log in with your MyChart username and password.  Masks are optional in the cancer centers. If you would like for your care team to wear a mask while they are taking care of you, please let them know. You may have one support person who is at least 68 years old accompany you for your appointments.

## 2022-06-15 NOTE — Progress Notes (Signed)
                                                                                                                                                             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 ***  Plan: The patient will follow-up with radiation oncology in *** . -----------------------------------  Eppie Gibson, MD

## 2022-06-16 ENCOUNTER — Encounter: Payer: Self-pay | Admitting: Hematology and Oncology

## 2022-06-23 NOTE — Progress Notes (Signed)
(  I called the patient today about her upcoming follow-up appointment in radiation oncology.   Given the state of the COVID-19 pandemic, concerning case numbers in our community, and guidance from [facility], I offered a phone assessment with the patient to determine if coming to the clinic was necessary. She accepted.  The patient denies any symptomatic concerns.  Specifically, they report good healing of their skin in the radiation fields.  Skin is intact.    I recommended that she continue skin care by applying oil or lotion with vitamin E to the skin in the radiation fields, BID, for 2 more months.  Continue follow-up with medical oncology - follow-up is scheduled on 07/21/2022.  I explained that yearly mammograms are important for patients with intact breast tissue, and physical exams are important after mastectomy for patients that cannot undergo mammography.  I encouraged her to call if she had further questions or concerns about her healing. Otherwise, she will follow-up PRN in radiation oncology. Patient is pleased with this plan, and we will cancel her upcoming follow-up to reduce the risk of COVID-19 transmission.  Treatment 04/29/22-05/26/2022

## 2022-06-24 ENCOUNTER — Encounter: Payer: Self-pay | Admitting: Internal Medicine

## 2022-06-24 ENCOUNTER — Ambulatory Visit: Payer: Medicare Other | Admitting: Internal Medicine

## 2022-06-24 VITALS — BP 118/68 | HR 66 | Ht 67.0 in | Wt 155.4 lb

## 2022-06-24 DIAGNOSIS — E559 Vitamin D deficiency, unspecified: Secondary | ICD-10-CM | POA: Diagnosis not present

## 2022-06-24 DIAGNOSIS — E039 Hypothyroidism, unspecified: Secondary | ICD-10-CM

## 2022-06-24 NOTE — Progress Notes (Unsigned)
Patient ID: Julie Pugh, female   DOB: 17-Sep-1956, 66 y.o.   MRN: [id number]  HPI  Julie Pugh is a 66 y.o. female, returning for f/u for hypothyroidism and vitamin D insufficiency. Last visit 1 year ago.  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  She had surgery and then RxTx, which she finished  1 mo ago.  She has more fatigue since the RxTx and also has a dull HA x 3 mo. She was found to have a higher BP >> in June, she started Amlodipine low dose. She still plays tennis, pickle ball, also walking.  Reviewed history: Pt. has been dx with hypothyroidism in 2012 (foggy mind, weight gain, fatigue); is on Armour 60 mg (equivalent of Levothyroxine 100 mcg), but was off 2016 Summer >> TSH in the Fall of 2016: 3.33 >> repeat TSH 1.83.  She was then restarted on Armour with maintenance of normal TFTs (?).  She changed to LT4 in 11/2019 per insurance preference.  She did not feel a difference after this change.  Pt is on Levothyroxine 100 daily, taken: - in am (5-6 AM) - fasting - at least 30 min from b'fast - no Ca, Fe, PPIs, + occasional multivitamins at night - in the winter - not on Biotin  Reviewed patient TFTs: Lab Results  Component Value Date   TSH 1.88 10/22/2021   TSH 2.21 06/11/2021   TSH 2.03 10/08/2020   TSH 1.83 03/06/2020   TSH 3.53 08/31/2019   TSH 1.46 06/04/2019   TSH 1.58 05/10/2018   TSH 2.63 09/28/2017   TSH 0.97 02/21/2017   TSH 1.31 05/25/2016   TSH 1.31 11/26/2015   TSH 1.83 09/29/2015   TSH 3.33 07/03/2015   TSH 2.9 06/28/2014   Lab Results  Component Value Date   FREET4 1.03 06/11/2021   FREET4 1.21 03/06/2020   FREET4 0.91 06/04/2019   FREET4 0.75 05/10/2018   FREET4 0.74 02/21/2017   FREET4 0.83 05/25/2016   FREET4 0.82 11/26/2015   Lab Results  Component Value Date   T3FREE 3.4 06/04/2019   T3FREE 3.8 05/10/2018   T3FREE 4.2 02/21/2017   T3FREE 3.8 05/25/2016    T3FREE 3.4 11/26/2015   T3FREE 2.7 06/28/2014   Pt denies: - feeling nodules in neck - hoarseness - dysphagia - choking  No FH of thyroid ds. or thyroid cancer. No h/o radiation tx to head or neck. No herbal supplements. No Biotin use. No recent steroids use.   Vitamin D def:  Reviewed vitamin D levels: Lab Results  Component Value Date   VD25OH 50.39 10/22/2021   VD25OH 53.6 06/11/2021   VD25OH 53.58 10/08/2020   VD25OH 52.3 03/06/2020   VD25OH 32.51 08/31/2019   VD25OH 27.37 (L) 06/04/2019   VD25OH 35.51 09/28/2017   VD25OH 51.77 02/21/2017   VD25OH 24.65 (L) 05/25/2016   VD25OH 29.33 (L) 11/26/2015   On 5000 units vitamin D daily + MVI in winter.  She stopped HRT 02/2015. 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  She refused to restart a statin.  In October 2019 she went to [place] with her sister, as part of an organized tour.  ROS: + see HPI  I reviewed pt's medications, allergies, PMH, social hx, family hx, and changes were documented in the history of present illness. Otherwise, unchanged from my initial visit note.  Past Medical History:  Diagnosis Date   Family history of breast  cancer    Family history of pancreatic cancer    Family history of prostate cancer    Hx of melanoma of skin    Hyperlipidemia    Hypothyroidism    Dr. Jaynie Collins managing. armour thyroid 15m    [place]    R shin 2009. sees derm q6 months   Varicose vein    Past Surgical History:  Procedure Laterality Date   BREAST BIOPSY     benign 2000-stereotactic biopsy   BREAST BIOPSY Right 03/12/2004   BREAST LUMPECTOMY WITH RADIOACTIVE SEED AND SENTINEL LYMPH NODE BIOPSY Left 03/16/2022   Procedure: LEFT BREAST LUMPECTOMY WITH RADIOACTIVE SEED AND AXILLARY SENTINEL LYMPH NODE BIOPSY;  Surgeon: WRolm Bookbinder MD;  [facility]  Service: General;  Laterality: Left;   VARICOSE VEIN SURGERY     2001   Social History   Socioeconomic History    Marital status: Married    Spouse name: Not on file   Number of children: Not on file   Years of education: Not on file   Highest education level: Not on file  Occupational History   Not on file  Tobacco Use   Smoking status: Never    Passive exposure: Past   Smokeless tobacco: Never  Vaping Use   Vaping Use: Never used  Substance and Sexual Activity   Alcohol use: Yes    Alcohol/week: 4.0 standard drinks of alcohol    Types: 4 Standard drinks or equivalent per week   Drug use: No   Sexual activity: Yes    Birth control/protection: Post-menopausal  Other Topics Concern   Not on file  Social History Narrative   Married (marriage is a stressor). 1 daughter MCloyde Reamsat [employer]age 6036in 2016.       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 Reading. Outdoors.    Social Determinants of Health   Financial Resource Strain: Not on file  Food Insecurity: Not on file  Transportation Needs: Not on file  Physical Activity: Not on file  Stress: Not on file  Social Connections: Not on file  Intimate Partner Violence: Not on file   Current Outpatient Medications on File Prior to Visit  Medication Sig Dispense Refill   amLODipine (NORVASC) 2.5 MG tablet Take 1 tablet (2.5 mg total) by mouth daily. 90 tablet 1   Bioflavonoid Products (ESTER-C) 500-200-60 MG TABS Take by mouth.     Cholecalciferol (VITAMIN D) 2000 units tablet Take 5,000 Units by mouth daily.     letrozole (FEMARA) 2.5 MG tablet Take 1 tablet (2.5 mg total) by mouth daily. 90 tablet 3   levothyroxine (SYNTHROID) 100 MCG tablet TAKE 1 TABLET(100 MCG) BY MOUTH DAILY 90 tablet 3   METRONIDAZOLE, TOPICAL, 0.75 % LOTN APP TO FACE AT NIGHT UTD (Patient not taking: Reported on 04/21/2022)     Multiple Vitamins-Minerals (CENTRUM SILVER 50+WOMEN PO) Take by mouth.     No current facility-administered medications on file prior  to visit.   No Known Allergies Family History  Problem Relation Age of Onset   Panic disorder Mother    COPD Mother        smoked until 96  Congestive Heart Failure Mother        pacemaker   Hypertension Mother    Heart attack Father        bypass [phone]  MI, former smoker   Prostate cancer Father 48   Hyperlipidemia Sister    Breast cancer Maternal Aunt    Pancreatic cancer Cousin 29   PE: BP 118/68 (BP Location: Right Arm, Patient Position: Sitting, Cuff Size: Normal)   Pulse 66   Ht 5' 7"  (1.702 m)   Wt 155 lb 6.4 oz (70.5 kg)   SpO2 95%   BMI 24.34 kg/m  Wt Readings from Last 3 Encounters:  06/24/22 155 lb 6.4 oz (70.5 kg)  06/09/22 155 lb 9.6 oz (70.6 kg)  05/19/22 154 lb (69.9 kg)   Constitutional: normal weight, in NAD Eyes:  EOMI, no exophthalmos ENT: no neck masses, no cervical lymphadenopathy Cardiovascular: RRR, No MRG Respiratory: CTA B Musculoskeletal: no deformities Skin:no rashes Neurological: no tremor with outstretched hands  ASSESSMENT: 1. Hypothyroidism  2. Vitamin D insufficiency  PLAN:  1. Patient with history of hypothyroidism, on desiccated thyroid extract.  She has an unusual history of being off thyroid hormones for the whole summer 2016, however, her TSH checked in 06/2015 returns normal, at 3.33.  She was then started on the full dose of Armour 60 mg daily (equivalent 100 mcg of levothyroxine) and her TFTs returned normal afterwards, however, in 11/2018 we had to go back to generic due to insurance preference.  She did not feel different after changing the formulation. - latest thyroid labs reviewed with pt. >> normal: Lab Results  Component Value Date   TSH 1.88 10/22/2021  - she continues on LT4 100 mcg daily - pt feels good on this dose, but has been feeling more fatigued in the last month after the radiotherapy.  She also has a headache for the last 3 months.  She plans to have this investigated. - we discussed about taking the  thyroid hormone every day, with water, >30 minutes before breakfast, separated by >4 hours from acid reflux medications, calcium, iron, multivitamins. Pt. is taking it correctly. - will check thyroid tests today: TSH and fT4 - If labs are abnormal, she will need to return for repeat TFTs in 1.5 months  2. Vit D insufficiency -She continues on vitamin D 5000 units daily -Dose, vitamin D level was excellent at last visit, 53.6 -we we will repeat a level today  Neefills needed - to hold until patient needs it. Component     Latest Ref Rng 06/24/2022  TSH     0.35 - 5.50 uIU/mL 2.12   VITD     30.00 - 100.00 ng/mL 46.12   T4,Free(Direct)     0.60 - 1.60 ng/dL 0.94   Labs are normal.  Philemon Kingdom, MD PhD [facility] Endocrinology

## 2022-06-24 NOTE — Patient Instructions (Signed)
Please continue Levothyroxine 100 mcg daily.  Take the thyroid hormone every day, with water, at least 30 minutes before breakfast, separated by at least 4 hours from: - acid reflux medications - calcium - iron - multivitamins  Please continue vitamin D daily.  Please return in 1 year.

## 2022-06-25 ENCOUNTER — Other Ambulatory Visit: Payer: Self-pay

## 2022-06-25 ENCOUNTER — Encounter: Payer: Self-pay | Admitting: Internal Medicine

## 2022-06-25 ENCOUNTER — Telehealth: Payer: Self-pay | Admitting: Family Medicine

## 2022-06-25 DIAGNOSIS — E785 Hyperlipidemia, unspecified: Secondary | ICD-10-CM

## 2022-06-25 LAB — T4, FREE: Free T4: 0.94 ng/dL (ref 0.60–1.60)

## 2022-06-25 LAB — VITAMIN D 25 HYDROXY (VIT D DEFICIENCY, FRACTURES): VITD: 46.12 ng/mL (ref 30.00–100.00)

## 2022-06-25 LAB — TSH: TSH: 2.12 u[IU]/mL (ref 0.35–5.50)

## 2022-06-25 MED ORDER — LEVOTHYROXINE SODIUM 100 MCG PO TABS: ORAL_TABLET | ORAL | 3 refills | Status: DC

## 2022-06-25 NOTE — Telephone Encounter (Signed)
Okay to order or should pt make and appointment to see you first

## 2022-06-25 NOTE — Addendum Note (Signed)
Addended by: Patrcia Dolly on: 06/25/2022 12:04 PM   Modules accepted: Orders

## 2022-06-25 NOTE — Telephone Encounter (Signed)
Ok to order calcium score (cardiac CT) dx hyperlipidemia

## 2022-06-25 NOTE — Telephone Encounter (Signed)
Pt called needing a referral for calcium scoring test. Wants referral to [facility]. [phone]

## 2022-06-25 NOTE — Telephone Encounter (Signed)
LM making pt aware she can call to get this scheduled when ready she has the contact # ELEA

## 2022-06-28 NOTE — Progress Notes (Signed)
Rn spoke with patient this morning concerning her 1 month follow-up appointment with Dr. Isidore Moos via telephone

## 2022-06-29 ENCOUNTER — Ambulatory Visit
Admission: RE | Admit: 2022-06-29 | Discharge: 2022-06-29 | Disposition: A | Discharge status: Home / Self Care | Payer: Medicare Other | Source: Ambulatory Visit | Attending: Radiation Oncology | Admitting: Radiation Oncology

## 2022-06-29 DIAGNOSIS — C50412 Malignant neoplasm of upper-outer quadrant of left female breast: Secondary | ICD-10-CM

## 2022-06-30 ENCOUNTER — Ambulatory Visit (INDEPENDENT_AMBULATORY_CARE_PROVIDER_SITE_OTHER): Payer: Medicare Other | Admitting: Family Medicine

## 2022-06-30 ENCOUNTER — Encounter: Payer: Self-pay | Admitting: Family Medicine

## 2022-06-30 ENCOUNTER — Inpatient Hospital Stay: Payer: Medicare Other | Admitting: Hematology and Oncology

## 2022-06-30 ENCOUNTER — Encounter: Payer: Self-pay | Admitting: *Deleted

## 2022-06-30 ENCOUNTER — Telehealth: Payer: Self-pay | Admitting: Hematology and Oncology

## 2022-06-30 ENCOUNTER — Inpatient Hospital Stay: Payer: Medicare Other | Attending: Hematology and Oncology

## 2022-06-30 VITALS — BP 138/78 | HR 67 | Temp 97.8°F | Resp 16 | Ht 67.0 in | Wt 154.5 lb

## 2022-06-30 VITALS — BP 148/77 | HR 61 | Temp 97.9°F | Resp 18 | Wt 155.0 lb

## 2022-06-30 DIAGNOSIS — Z923 Personal history of irradiation: Secondary | ICD-10-CM | POA: Insufficient documentation

## 2022-06-30 DIAGNOSIS — I10 Essential (primary) hypertension: Secondary | ICD-10-CM | POA: Diagnosis not present

## 2022-06-30 DIAGNOSIS — R519 Headache, unspecified: Secondary | ICD-10-CM

## 2022-06-30 DIAGNOSIS — Z170 Estrogen receptor positive status [ER+]: Secondary | ICD-10-CM

## 2022-06-30 DIAGNOSIS — Z79811 Long term (current) use of aromatase inhibitors: Secondary | ICD-10-CM | POA: Diagnosis not present

## 2022-06-30 DIAGNOSIS — C50412 Malignant neoplasm of upper-outer quadrant of left female breast: Secondary | ICD-10-CM | POA: Insufficient documentation

## 2022-06-30 DIAGNOSIS — Z5112 Encounter for antineoplastic immunotherapy: Secondary | ICD-10-CM | POA: Insufficient documentation

## 2022-06-30 MED ORDER — TRASTUZUMAB-HYALURONIDASE-OYSK 600-10000 MG-UNT/5ML ~~LOC~~ SOLN
600.0000 mg | Freq: Once | SUBCUTANEOUS | Status: AC
  Administered 2022-06-30: 600 mg via SUBCUTANEOUS
  Filled 2022-06-30: qty 5

## 2022-06-30 NOTE — Patient Instructions (Signed)
[facility]COL[facility]  Discharge Instructions: Thank you for choosing [facility] to provide your oncology and hematology care.   If you have a lab appointment with the Cancer Center, please go directly to the Cancer Center and check in at the registration area.   Wear comfortable clothing and clothing appropriate for easy access to any Portacath or PICC line.   We strive to give you quality time with your provider. You may need to reschedule your appointment if you arrive late (15 or more minutes).  Arriving late affects you and other patients whose appointments are after yours.  Also, if you miss three or more appointments without notifying the office, you may be dismissed from the clinic at the provider's discretion.      For prescription refill requests, have your pharmacy contact our office and allow 72 hours for refills to be completed.    Today you received the following chemotherapy and/or immunotherapy agents: Herceptin Hylecta      To help prevent nausea and vomiting after your treatment, we encourage you to take your nausea medication as directed.  BELOW ARE SYMPTOMS THAT SHOULD BE REPORTED IMMEDIATELY: *FEVER GREATER THAN 100.4 F (38 C) OR HIGHER *CHILLS OR SWEATING *NAUSEA AND VOMITING THAT IS NOT CONTROLLED WITH YOUR NAUSEA MEDICATION *UNUSUAL SHORTNESS OF BREATH *UNUSUAL BRUISING OR BLEEDING *URINARY PROBLEMS (pain or burning when urinating, or frequent urination) *BOWEL PROBLEMS (unusual diarrhea, constipation, pain near the anus) TENDERNESS IN MOUTH AND THROAT WITH OR WITHOUT PRESENCE OF ULCERS (sore throat, sores in mouth, or a toothache) UNUSUAL RASH, SWELLING OR PAIN  UNUSUAL VAGINAL DISCHARGE OR ITCHING   Items with * indicate a potential emergency and should be followed up as soon as possible or go to the Emergency Department if any problems should occur.  Please show the CHEMOTHERAPY ALERT CARD or IMMUNOTHERAPY ALERT CARD at  check-in to the Emergency Department and triage nurse.  Should you have questions after your visit or need to cancel or reschedule your appointment, please contact [facility] MED[facility] ONCOL[facility]  Dept: [phone]  and follow the prompts.  Office hours are 8:00 a.m. to 4:30 p.m. Monday - Friday. Please note that voicemails left after 4:00 p.m. may not be returned until the following business day.  We are closed weekends and major holidays. You have access to a nurse at all times for urgent questions. Please call the main number to the clinic Dept: [phone] and follow the prompts.   For any non-urgent questions, you may also contact your provider using MyChart. We now offer e-Visits for anyone 18 and older to request care online for non-urgent symptoms. For details visit [email or web address].   Also download the MyChart app! Go to the app store, search "MyChart", open the app, select [facility], and log in with your MyChart username and password.  Masks are optional in the cancer centers. If you would like for your care team to wear a mask while they are taking care of you, please let them know. You may have one support person who is at least 68 years old accompany you for your appointments. 

## 2022-06-30 NOTE — Progress Notes (Signed)
   Subjective:    Patient ID: Julie Pugh, female    DOB: 01-01-1956, 66 y.o.   MRN: [id number]  HPI HTN- chronic problem, on Amlodipine 2.'5mg'$  daily w/ adequate control.  No CP, SOB, edema.  HA- pt reports 'dull headache' that is occurring daily.  Sxs started late May.  Pt reports HA will 'move around' and not localize to 1 particular area but she feels it more in the back than anywhere else.  Now on Femara for breast cancer dx'd earlier this year.  No photo or phonophobia.  Improves w/ tylenol as needed.  No N/V.  No dizziness.   Review of Systems For ROS see HPI     Objective:   Physical Exam Vitals reviewed.  Constitutional:      General: She is not in acute distress.    Appearance: She is well-developed. She is not ill-appearing.  HENT:     Head: Normocephalic and atraumatic.  Eyes:     Extraocular Movements: Extraocular movements intact.     Conjunctiva/sclera: Conjunctivae normal.     Pupils: Pupils are equal, round, and reactive to light.  Neck:     Thyroid: No thyromegaly.  Cardiovascular:     Rate and Rhythm: Normal rate and regular rhythm.     Heart sounds: Normal heart sounds. No murmur heard. Pulmonary:     Effort: Pulmonary effort is normal. No respiratory distress.     Breath sounds: Normal breath sounds.  Abdominal:     General: There is no distension.     Palpations: Abdomen is soft.     Tenderness: There is no abdominal tenderness.  Musculoskeletal:     Cervical back: Normal range of motion and neck supple.  Lymphadenopathy:     Cervical: No cervical adenopathy.  Skin:    General: Skin is warm and dry.  Neurological:     Mental Status: She is alert and oriented to person, place, and time.     Cranial Nerves: No cranial nerve deficit or facial asymmetry.     Gait: Gait normal.  Psychiatric:        Mood and Affect: Mood normal.        Speech: Speech normal.        Behavior: Behavior normal.          Assessment & Plan:  Headache- new.  Sxs  corresponded to when she was dx'd w/ breast cancer and things became very stressful.  Could also relate to starting Femara.  HAs are most often tension type HAs which correlates with the idea of stress.  HAs respond to tylenol and are not associated w/ N/V, sensitivity to light or sound, or other red flags.  Encouraged pt to treat supportively.  Will follow.

## 2022-06-30 NOTE — Patient Instructions (Signed)
Follow up as needed or as scheduled No need for labs today- yay!!! I think the headache is mostly stress related and/or a side effect from the medication Make sure you are drinking plenty of fluids Tylenol as needed Call with any questions or concerns Stay Safe!  Stay Healthy! ENJOY THE [place]!!!

## 2022-06-30 NOTE — Telephone Encounter (Signed)
Rescheduled appointment per provider [facility]. Left voicemail. 

## 2022-07-04 DIAGNOSIS — I10 Essential (primary) hypertension: Secondary | ICD-10-CM | POA: Insufficient documentation

## 2022-07-04 NOTE — Assessment & Plan Note (Signed)
New.  Pt was dx'd earlier this year.  S/p surgery and now on Femara.  She is handling things well but this has been understandably very stressful.  Suspect that stress is contributing to her headaches and possibly the Femara.  Will continue to follow along.

## 2022-07-04 NOTE — Assessment & Plan Note (Signed)
Adequate control on Amlodipine.  Doubt her recent headaches have been BP related.  Will continue to follow.

## 2022-07-05 ENCOUNTER — Ambulatory Visit: Payer: Medicare Other | Attending: General Surgery

## 2022-07-05 VITALS — Wt 156.4 lb

## 2022-07-05 DIAGNOSIS — Z483 Aftercare following surgery for neoplasm: Secondary | ICD-10-CM | POA: Insufficient documentation

## 2022-07-05 NOTE — Therapy (Signed)
OUTPATIENT PHYSICAL THERAPY SOZO SCREENING NOTE   Patient Name: Julie Pugh MRN: [id number] DOB:Dec 05, 1955, 66 y.o., female Today's Date: 07/05/2022  PCP: [name], MD REFERRING PROVIDER: Rolm Bookbinder, MD   PT End of Session - 07/05/22 1517     Visit Number 2   # unchanged due to screen only   PT Start Time 61    PT Stop Time 1519    PT Time Calculation (min) 4 min    Activity Tolerance Patient tolerated treatment well    Behavior During Therapy WFL for tasks assessed/performed             Past Medical History:  Diagnosis Date   Family history of breast cancer    Family history of pancreatic cancer    Family history of prostate cancer    Hx of melanoma of skin    Hyperlipidemia    Hypothyroidism    Dr. Jaynie Collins managing. armour thyroid 30m    Melanoma ([facility]    R shin 2009. sees derm q6 months   Varicose vein    Past Surgical History:  Procedure Laterality Date   BREAST BIOPSY     benign 2000-stereotactic biopsy   BREAST BIOPSY Right 03/12/2004   BREAST LUMPECTOMY WITH RADIOACTIVE SEED AND SENTINEL LYMPH NODE BIOPSY Left 03/16/2022   Procedure: LEFT BREAST LUMPECTOMY WITH RADIOACTIVE SEED AND AXILLARY SENTINEL LYMPH NODE BIOPSY;  Surgeon: WRolm Bookbinder MD;  [place] [facility]  Service: General;  Laterality: Left;   [facility]     [facility]   Patient Active Problem List   Diagnosis Date Noted   HTN (hypertension) 07/04/2022   Genetic testing 05/20/2022   Family history of breast cancer 05/05/2022   Family history of pancreatic cancer 05/05/2022   Family history of prostate cancer 05/05/2022   Elevated blood pressure reading 04/23/2022   Malignant neoplasm of upper-outer quadrant of left breast in female, estrogen receptor positive ([facility] 02/12/2022   History of COVID-19 06/12/2020   Visit for preventive health examination 09/28/2017   Hyperlipidemia 07/10/2015   Hypothyroidism    History of melanoma     Varicose vein     REFERRING DIAG: left breast cancer at risk for lymphedema  THERAPY DIAG:  Aftercare following surgery for neoplasm  PERTINENT HISTORY: Patient was diagnosed on 02/08/22 with left grade 2. It measures less than 1 cm and is located in the upper outer quadrant. It is ER/PR+ (HER2 equivocal - waiting on results) with a Ki67 of 30%. L Lumpectomy and SLNB (0/2) on 03/16/22  PRECAUTIONS: left UE Lymphedema risk, None  SUBJECTIVE: Pt returns for her first 3 month L-Dex screen.   PAIN:  Are you having pain? No  SOZO SCREENING: Patient was assessed today using the SOZO machine to determine the lymphedema index score. This was compared to her baseline score. It was determined that she is within the recommended range when compared to her baseline and no further action is needed at this time. She will continue SOZO screenings. These are done every 3 months for 2 years post operatively followed by every 6 months for 2 years, and then annually.   L-DEX FLOWSHEETS - 07/05/22 1500       L-DEX LYMPHEDEMA SCREENING   Measurement Type Unilateral    L-DEX MEASUREMENT EXTREMITY Upper Extremity    POSITION  Standing    DOMINANT SIDE Right    At Risk Side Left    BASELINE SCORE (UNILATERAL) -2.7    L-DEX SCORE (UNILATERAL) -  3.2    VALUE CHANGE (UNILAT) -0.5               Otelia Limes, PTA 07/05/2022, 3:19 PM

## 2022-07-06 ENCOUNTER — Ambulatory Visit (HOSPITAL_COMMUNITY)
Admission: RE | Admit: 2022-07-06 | Discharge: 2022-07-06 | Disposition: A | Discharge status: Home / Self Care | Payer: Medicare Other | Source: Ambulatory Visit | Attending: Hematology and Oncology | Admitting: Hematology and Oncology

## 2022-07-06 DIAGNOSIS — C50412 Malignant neoplasm of upper-outer quadrant of left female breast: Secondary | ICD-10-CM

## 2022-07-06 DIAGNOSIS — Z0189 Encounter for other specified special examinations: Secondary | ICD-10-CM | POA: Diagnosis not present

## 2022-07-06 DIAGNOSIS — Z5181 Encounter for therapeutic drug level monitoring: Secondary | ICD-10-CM | POA: Insufficient documentation

## 2022-07-06 DIAGNOSIS — E785 Hyperlipidemia, unspecified: Secondary | ICD-10-CM | POA: Diagnosis not present

## 2022-07-06 DIAGNOSIS — Z170 Estrogen receptor positive status [ER+]: Secondary | ICD-10-CM | POA: Insufficient documentation

## 2022-07-06 DIAGNOSIS — Z79899 Other long term (current) drug therapy: Secondary | ICD-10-CM | POA: Diagnosis not present

## 2022-07-06 LAB — ECHOCARDIOGRAM COMPLETE
AV Peak grad: 6.9 mmHg
Ao pk vel: 1.31 m/s
Area-P 1/2: 4.29 cm2
S' Lateral: 3.15 cm

## 2022-07-12 ENCOUNTER — Telehealth: Payer: Self-pay | Admitting: Family Medicine

## 2022-07-12 DIAGNOSIS — R0683 Snoring: Secondary | ICD-10-CM

## 2022-07-12 NOTE — Telephone Encounter (Signed)
Caller name: Bridgett  On DPR? :yes/no: Yes  Call back number: [phone]  Provider they see: Birdie Riddle  Reason for call:  calling b/c she would like a referral to [facility] for sleep apnea consult.

## 2022-07-12 NOTE — Telephone Encounter (Signed)
I have left a Vm for pt to call me back in regards to her message

## 2022-07-13 NOTE — Telephone Encounter (Signed)
Referral placed to [facility] pulmonary

## 2022-07-13 NOTE — Telephone Encounter (Signed)
Called to inform pt the referral has been placed no answer, LM

## 2022-07-16 NOTE — Progress Notes (Signed)
Patient Care Team: [name], MD as PCP - General (Family Medicine) Philemon Kingdom, MD as Consulting Physician (Internal Medicine) Nicholas Lose, MD as Medical Oncologist (Hematology and Oncology)  DIAGNOSIS: No diagnosis found.  SUMMARY OF ONCOLOGIC HISTORY: Oncology History  Malignant neoplasm of upper-outer quadrant of left breast in female, estrogen receptor positive ([place])  02/12/2022 Initial Diagnosis   Screening mammogram detected left breast mass, by ultrasound measured 0.6 cm, biopsy revealed grade 2 IDC ER 100%, PR 2%, HER2 equivocal by IHC and FISH positive, Ki-67 30%   02/17/2022 Cancer Staging   Staging form: Breast, AJCC 8th Edition - Clinical: Stage IA (cT1b, cN0, cM0, G2, ER+, PR-, HER2+) - Signed by Nicholas Lose, MD on 02/17/2022 Stage prefix: Initial diagnosis Histologic grading system: 3 grade system   03/16/2022 Surgery   Left lumpectomy: Grade 3 IDC 5 mm, negative for lymphovascular invasion, ER 100%, PR 2%, HER2 FISH positive, margins negative, 0/2 lymph nodes negative   03/29/2022 -  Anti-estrogen oral therapy   Letrozole daily   04/07/2022 - 05/19/2022 Chemotherapy   Patient is on Treatment Plan : BREAST Trastuzumab q21d x 13 cycles     04/07/2022 -  Chemotherapy   Patient is on Treatment Plan : BREAST Trastuzumab IV (8/6) or SQ (600) D1 q21d     04/29/2022 - 05/26/2022 Radiation Therapy   Adjuvant Radiation therapy   05/05/2022 Genetic Testing   Referral pending   05/09/2022 Cancer Staging   Staging form: Breast, AJCC 8th Edition - Pathologic: Stage IA (pT1a, pN0, cM0, G3, ER+, PR-, HER2+) - Signed by Gardenia Phlegm, NP on 05/09/2022 Histologic grading system: 3 grade system   05/13/2022 Genetic Testing   Negative genetic testing on the CancerNext-Expanded+RNAinsight panel.  The report date is May 13, 2022.  The CancerNext-Expanded gene panel offered by [facility] and includes sequencing and rearrangement analysis for the following  77 genes: AIP, ALK, APC*, ATM*, AXIN2, BAP1, BARD1, BLM, BMPR1A, BRCA1*, BRCA2*, BRIP1*, CDC73, CDH1*, CDK4, CDKN1B, CDKN2A, CHEK2*, CTNNA1, DICER1, FANCC, FH, FLCN, GALNT12, KIF1B, LZTR1, MAX, MEN1, MET, MLH1*, MSH2*, MSH3, MSH6*, MUTYH*, NBN, NF1*, NF2, NTHL1, PALB2*, PHOX2B, PMS2*, POT1, PRKAR1A, PTCH1, PTEN*, RAD51C*, RAD51D*, RB1, RECQL, RET, SDHA, SDHAF2, SDHB, SDHC, SDHD, SMAD4, SMARCA4, SMARCB1, SMARCE1, STK11, SUFU, TMEM127, TP53*, TSC1, TSC2, VHL and XRCC2 (sequencing and deletion/duplication); EGFR, EGLN1, HOXB13, KIT, MITF, PDGFRA, POLD1, and POLE (sequencing only); EPCAM and GREM1 (deletion/duplication only). DNA and RNA analyses performed for * genes.      CHIEF COMPLIANT:  Follow-up left lumpectomy on herceptin and letrozole    INTERVAL HISTORY: Julie Pugh is a 66 y.o with the above mention left lumpectomy on herceptin and letrozole. She presents to the clinic today for a follow-up.    ALLERGIES:  has No Known Allergies.  MEDICATIONS:  Current Outpatient Medications  Medication Sig Dispense Refill   amLODipine (NORVASC) 2.5 MG tablet Take 1 tablet (2.5 mg total) by mouth daily. 90 tablet 1   Bioflavonoid Products (ESTER-C) 500-200-60 MG TABS Take by mouth.     Cholecalciferol (VITAMIN D) 2000 units tablet Take 5,000 Units by mouth daily.     letrozole (FEMARA) 2.5 MG tablet Take 1 tablet (2.5 mg total) by mouth daily. 90 tablet 3   levothyroxine (SYNTHROID) 100 MCG tablet TAKE 1 TABLET(100 MCG) BY MOUTH DAILY 90 tablet 3   METRONIDAZOLE, TOPICAL, 0.75 % LOTN      No current facility-administered medications for this visit.    PHYSICAL EXAMINATION: ECOG PERFORMANCE STATUS: {CHL  ONC ECOG ML:[id number]}  There were no vitals filed for this visit. There were no vitals filed for this visit.  BREAST:*** No palpable masses or nodules in either right or left breasts. No palpable axillary supraclavicular or infraclavicular adenopathy no breast tenderness or nipple  discharge. (exam performed in the presence of a chaperone)  LABORATORY DATA:  I have reviewed the data as listed    Latest Ref Rng & Units 10/22/2021    1:21 PM 10/08/2020    9:07 AM 06/13/2020    8:30 AM  CMP  Glucose 70 - 99 mg/dL 87  89  79   BUN 6 - 23 mg/dL _0 Creatinine 0.40 - 1.20 mg/dL 0.95  0.87  0.91   Sodium 135 - 145 mEq/L 140  140  141   Potassium 3.5 - 5.1 mEq/L 4.2  4.4  4.3   Chloride 96 - 112 mEq/L 105  105  105   CO2 19 - 32 mEq/L _1 Calcium 8.4 - 10.5 mg/dL 9.4  9.4  9.2   Total Protein 6.0 - 8.3 g/dL 7.2  7.0  6.3   Total Bilirubin 0.2 - 1.2 mg/dL 0.5  0.7  0.6   Alkaline Phos 39 - 117 U/L 60  65  53   AST 0 - 37 U/L _2 ALT 0 - 35 U/L _3 Lab Results  Component Value Date   WBC 5.9 10/22/2021   HGB 14.5 10/22/2021   HCT 44.1 10/22/2021   MCV 90.0 10/22/2021   PLT 297.0 10/22/2021   NEUTROABS 3.1 10/22/2021    ASSESSMENT & PLAN:  No problem-specific Assessment & Plan notes found for this encounter.    No orders of the defined types were placed in this encounter.  The patient has a good understanding of the overall plan. she agrees with it. she will call with any problems that may develop before the next visit here. Total time spent: 30 mins including face to face time and time spent for planning, charting and co-ordination of care   Suzzette Righter, [place] 07/16/22    I Gardiner Coins am scribing for Dr. Lindi Adie  ***

## 2022-07-20 ENCOUNTER — Encounter: Payer: Self-pay | Admitting: *Deleted

## 2022-07-21 ENCOUNTER — Inpatient Hospital Stay: Payer: Medicare Other

## 2022-07-21 ENCOUNTER — Inpatient Hospital Stay: Payer: Medicare Other | Admitting: Hematology and Oncology

## 2022-07-21 ENCOUNTER — Inpatient Hospital Stay: Payer: Medicare Other | Attending: Hematology and Oncology | Admitting: Hematology and Oncology

## 2022-07-21 VITALS — BP 149/87

## 2022-07-21 DIAGNOSIS — C50412 Malignant neoplasm of upper-outer quadrant of left female breast: Secondary | ICD-10-CM

## 2022-07-21 DIAGNOSIS — Z79899 Other long term (current) drug therapy: Secondary | ICD-10-CM | POA: Diagnosis not present

## 2022-07-21 DIAGNOSIS — Z923 Personal history of irradiation: Secondary | ICD-10-CM | POA: Diagnosis not present

## 2022-07-21 DIAGNOSIS — Z5112 Encounter for antineoplastic immunotherapy: Secondary | ICD-10-CM | POA: Diagnosis not present

## 2022-07-21 DIAGNOSIS — Z79811 Long term (current) use of aromatase inhibitors: Secondary | ICD-10-CM | POA: Diagnosis not present

## 2022-07-21 DIAGNOSIS — Z9221 Personal history of antineoplastic chemotherapy: Secondary | ICD-10-CM | POA: Insufficient documentation

## 2022-07-21 DIAGNOSIS — Z170 Estrogen receptor positive status [ER+]: Secondary | ICD-10-CM | POA: Insufficient documentation

## 2022-07-21 MED ORDER — TRASTUZUMAB-HYALURONIDASE-OYSK 600-10000 MG-UNT/5ML ~~LOC~~ SOLN
600.0000 mg | Freq: Once | SUBCUTANEOUS | Status: AC
  Administered 2022-07-21: 600 mg via SUBCUTANEOUS
  Filled 2022-07-21: qty 5

## 2022-07-21 NOTE — Assessment & Plan Note (Addendum)
02/12/2022:Screening mammogram detected left breast mass, by ultrasound measured 0.6 cm, biopsy revealed grade 2 IDC ER 100%, PR 2%, HER2 equivocal by IHC and FISH positive, Ki-67 30%   Treatment plan: 1.6/6/2023Left lumpectomy: Grade 3 IDC24m (6 mm on biopsy), negative for lymphovascular invasion, ER 100%, PR 2%, HER2 FISH positive, margins negative, 0/2 lymph nodes negative 2.based on only 624mtumor size, we discussed the pros and cons of systemic treatments and decided to treat her with Herceptin with letrozole. 3.Adjuvant radiation completed 05/26/2022 4.Followed by adjuvant antiestrogen therapy ---------------------------------------------------------------------------- Current treatment: Herceptin started 04/07/2022 with letrozole Herceptin toxicities: Denies any adverse effects  Letrozole toxicities: Denies any hot flashes or arthralgias She recently won a second [employer] Return to clinic every 3 weeks for Herceptin injections every 6 weeks and follow-up with me.

## 2022-07-21 NOTE — Patient Instructions (Signed)
[facility] MEDICAL [facility]COLOGY  Discharge Instructions: Thank you for choosing [facility] to provide your oncology and hematology care.   If you have a lab appointment with the Cancer Center, please go directly to the Cancer Center and check in at the registration area.   Wear comfortable clothing and clothing appropriate for easy access to any Portacath or PICC line.   We strive to give you quality time with your provider. You may need to reschedule your appointment if you arrive late (15 or more minutes).  Arriving late affects you and other patients whose appointments are after yours.  Also, if you miss three or more appointments without notifying the office, you may be dismissed from the clinic at the provider's discretion.      For prescription refill requests, have your pharmacy contact our office and allow 72 hours for refills to be completed.    Today you received the following chemotherapy and/or immunotherapy agents: Herceptin Hylecta      To help prevent nausea and vomiting after your treatment, we encourage you to take your nausea medication as directed.  BELOW ARE SYMPTOMS THAT SHOULD BE REPORTED IMMEDIATELY: *FEVER GREATER THAN 100.4 F (38 C) OR HIGHER *CHILLS OR SWEATING *NAUSEA AND VOMITING THAT IS NOT CONTROLLED WITH YOUR NAUSEA MEDICATION *UNUSUAL SHORTNESS OF BREATH *UNUSUAL BRUISING OR BLEEDING *URINARY PROBLEMS (pain or burning when urinating, or frequent urination) *BOWEL PROBLEMS (unusual diarrhea, constipation, pain near the anus) TENDERNESS IN MOUTH AND THROAT WITH OR WITHOUT PRESENCE OF ULCERS (sore throat, sores in mouth, or a toothache) UNUSUAL RASH, SWELLING OR PAIN  UNUSUAL VAGINAL DISCHARGE OR ITCHING   Items with * indicate a potential emergency and should be followed up as soon as possible or go to the Emergency Department if any problems should occur.  Please show the CHEMOTHERAPY ALERT CARD or IMMUNOTHERAPY ALERT CARD at  check-in to the Emergency Department and triage nurse.  Should you have questions after your visit or need to cancel or reschedule your appointment, please contact [facility]COL[facility]  Dept: [phone]  and follow the prompts.  Office hours are 8:00 a.m. to 4:30 p.m. Monday - Friday. Please note that voicemails left after 4:00 p.m. may not be returned until the following business day.  We are closed weekends and major holidays. You have access to a nurse at all times for urgent questions. Please call the main number to the clinic Dept: [phone] and follow the prompts.   For any non-urgent questions, you may also contact your provider using MyChart. We now offer e-Visits for anyone 18 and older to request care online for non-urgent symptoms. For details visit [email or web address].   Also download the MyChart app! Go to the app store, search "MyChart", open the app, select [facility], and log in with your MyChart username and password.  Masks are optional in the cancer centers. If you would like for your care team to wear a mask while they are taking care of you, please let them know. You may have one support Phoenyx Melka who is at least 68 years old accompany you for your appointments. 

## 2022-07-26 ENCOUNTER — Encounter: Payer: Self-pay | Admitting: *Deleted

## 2022-08-02 ENCOUNTER — Ambulatory Visit (HOSPITAL_BASED_OUTPATIENT_CLINIC_OR_DEPARTMENT_OTHER)
Admission: RE | Admit: 2022-08-02 | Discharge: 2022-08-02 | Disposition: A | Discharge status: Home / Self Care | Payer: Medicare Other | Source: Ambulatory Visit | Attending: Family Medicine | Admitting: Family Medicine

## 2022-08-02 DIAGNOSIS — E785 Hyperlipidemia, unspecified: Secondary | ICD-10-CM | POA: Insufficient documentation

## 2022-08-03 ENCOUNTER — Telehealth: Payer: Self-pay

## 2022-08-03 ENCOUNTER — Other Ambulatory Visit: Payer: Self-pay | Admitting: Family Medicine

## 2022-08-03 DIAGNOSIS — R918 Other nonspecific abnormal finding of lung field: Secondary | ICD-10-CM

## 2022-08-03 NOTE — Progress Notes (Signed)
Left pt a VM stating lab results  

## 2022-08-03 NOTE — Telephone Encounter (Signed)
Radiology called to verify we received the over-read from them on the CT cardiac

## 2022-08-11 ENCOUNTER — Inpatient Hospital Stay: Payer: Medicare Other | Attending: Hematology and Oncology

## 2022-08-11 VITALS — BP 142/71 | HR 67 | Temp 98.2°F | Resp 14

## 2022-08-11 DIAGNOSIS — Z79899 Other long term (current) drug therapy: Secondary | ICD-10-CM | POA: Diagnosis not present

## 2022-08-11 DIAGNOSIS — C50412 Malignant neoplasm of upper-outer quadrant of left female breast: Secondary | ICD-10-CM | POA: Diagnosis not present

## 2022-08-11 DIAGNOSIS — Z923 Personal history of irradiation: Secondary | ICD-10-CM | POA: Diagnosis not present

## 2022-08-11 DIAGNOSIS — Z79811 Long term (current) use of aromatase inhibitors: Secondary | ICD-10-CM | POA: Diagnosis not present

## 2022-08-11 DIAGNOSIS — Z5112 Encounter for antineoplastic immunotherapy: Secondary | ICD-10-CM | POA: Diagnosis not present

## 2022-08-11 DIAGNOSIS — Z170 Estrogen receptor positive status [ER+]: Secondary | ICD-10-CM | POA: Diagnosis not present

## 2022-08-11 MED ORDER — TRASTUZUMAB-HYALURONIDASE-OYSK 600-10000 MG-UNT/5ML ~~LOC~~ SOLN
600.0000 mg | Freq: Once | SUBCUTANEOUS | Status: AC
  Administered 2022-08-11: 600 mg via SUBCUTANEOUS
  Filled 2022-08-11: qty 5

## 2022-08-13 ENCOUNTER — Ambulatory Visit (INDEPENDENT_AMBULATORY_CARE_PROVIDER_SITE_OTHER): Payer: Medicare Other | Admitting: Adult Health

## 2022-08-13 ENCOUNTER — Encounter: Payer: Self-pay | Admitting: Adult Health

## 2022-08-13 VITALS — BP 142/78 | HR 78 | Temp 97.9°F | Ht 67.0 in | Wt 156.2 lb

## 2022-08-13 DIAGNOSIS — R0683 Snoring: Secondary | ICD-10-CM | POA: Diagnosis not present

## 2022-08-13 DIAGNOSIS — J479 Bronchiectasis, uncomplicated: Secondary | ICD-10-CM | POA: Insufficient documentation

## 2022-08-13 NOTE — Progress Notes (Signed)
Reviewed and agree with assessment/plan.   Chesley Mires, MD [facility] Pulmonary/Critical Care 08/13/2022, 3:25 PM Pager:  [phone]

## 2022-08-13 NOTE — Progress Notes (Signed)
$'@Patient'U$  ID: Julie Pugh, female    DOB: 1955-10-15, 66 y.o.   MRN: [id number]  Chief Complaint  Patient presents with   Consult    Referring provider: [name], MD  HPI: 66 year old female seen for consult August 13, 2022 for daytime sleepiness, snoring and restless sleep and abnormal CT chest with bronchiectasis Medical history significant for breast cancer diagnosed Feb 12, 2022 status post lumpectomy, chemo/radiation and letrozole  TEST/EVENTS :  2D echo July 06, 2022 EF at 60-65%,, normal pulmonary artery systolic pressure  [id number]/[id number] Coronary CT chest no evidence of lymphadenopathy in the visualized mediastinum/hilar area, tree-in-bud nodularity in upper lobes and left lower lobe associated with bronchiectasis and bronchial wall thickening in the upper lobes, right middle lobe and anterior lower lobes, scattered areas of small airway impaction.  08/13/2022 Sleep and Pulmonary consult  Patient presents for consult today.  Kindly referred by her primary care provider Dr. Birdie Riddle patient complains of restless sleep, snoring, daytime sleepiness, frequent headaches.  Typically goes to bed about 10:30 PM.  Takes only a couple minutes to go to sleep.  Is up several times throughout the night.  Gets up about 7:30 AM.  She has never had a sleep study before.  Minimal caffeine intake.  No history of congestive heart failure or stroke.  Epworth score is 6 out of 24.  Typically gets sleepy if she sits down to watch TV in the evening hours and also in the afternoon time. No symptoms suspicious for cataplexy or sleep paralysis.  No removable dental work. Occasionally takes Tylenol PM to help with sleep.  She is followed by oncology diagnosed with breast cancer earlier this year currently on Herceptin and letrozole.  Recently had a coronary CT chest by cardiology.  Lung views showed tree-in-bud nodularity bronchiectasis and bronchial wall thickening see report from above. Started  Herceptin few months ago.  Covid 19 infection 04/220 , moderate to severe case, treated with MAB . Sick for 1 week, no hospital . Says had Whooping cough as child .  No history of frequent colds or bronchitis.  Says she never coughs.  Has no shortness of breath or wheezing.  No history of asthma, COPD or emphysema.  She is a never smoker. Denies reflux or chronic sinus symptoms  Social history Patient is married.  Lives with her husband.  Works part-time at [employer].  Has adult children.  She is a never smoker.  Social alcohol.  No drug use.  Plays tennis and pickleball on a regular basis.  Does some type of exercise with walking every day.  Family history positive for emphysema, heart disease and prostate cancer.   Past Surgical History:  Procedure Laterality Date   BREAST BIOPSY     benign 2000-stereotactic biopsy   BREAST BIOPSY Right 03/12/2004   BREAST LUMPECTOMY WITH RADIOACTIVE SEED AND SENTINEL LYMPH NODE BIOPSY Left 03/16/2022   Procedure: LEFT BREAST LUMPECTOMY WITH RADIOACTIVE SEED AND AXILLARY SENTINEL LYMPH NODE BIOPSY;  Surgeon: Rolm Bookbinder, MD;  Location: [facility];  Service: General;  Laterality: Left;   VARICOSE VEIN SURGERY     2001     No Known Allergies   There is no immunization history on file for this patient.  Past Medical History:  Diagnosis Date   Family history of breast cancer    Family history of pancreatic cancer    Family history of prostate cancer    Hx of melanoma of skin  Hyperlipidemia    Hypothyroidism    Dr. Jaynie Collins managing. armour thyroid '60mg'$     Melanoma ([place])    R shin 2009. sees derm q6 months   Varicose vein     Tobacco History: Social History   Tobacco Use  Smoking Status Never   Passive exposure: Past  Smokeless Tobacco Never   Counseling given: Not Answered   Outpatient Medications Prior to Visit  Medication Sig Dispense Refill   amLODipine (NORVASC) 2.5 MG tablet Take 1 tablet  (2.5 mg total) by mouth daily. 90 tablet 1   Bioflavonoid Products (ESTER-C) 500-200-60 MG TABS Take by mouth.     Cholecalciferol (VITAMIN D) 2000 units tablet Take 5,000 Units by mouth daily.     diphenhydramine-acetaminophen (TYLENOL PM) 25-500 MG TABS tablet Take 1 tablet by mouth at bedtime as needed.     letrozole (FEMARA) 2.5 MG tablet Take 1 tablet (2.5 mg total) by mouth daily. 90 tablet 3   levothyroxine (SYNTHROID) 100 MCG tablet TAKE 1 TABLET(100 MCG) BY MOUTH DAILY 90 tablet 3   METRONIDAZOLE, TOPICAL, 0.75 % LOTN      No facility-administered medications prior to visit.     Review of Systems:   Constitutional:   No  weight loss, night sweats,  Fevers, chills, fatigue, or  lassitude.  HEENT:   No headaches,  Difficulty swallowing,  Tooth/dental problems, or  Sore throat,                No sneezing, itching, ear ache, nasal congestion, post nasal drip,   CV:  No chest pain,  Orthopnea, PND, swelling in lower extremities, anasarca, dizziness, palpitations, syncope.   GI  No heartburn, indigestion, abdominal pain, nausea, vomiting, diarrhea, change in bowel habits, loss of appetite, bloody stools.   Resp: No shortness of breath with exertion or at rest.  No excess mucus, no productive cough,  No non-productive cough,  No coughing up of blood.  No change in color of mucus.  No wheezing.  No chest wall deformity  Skin: no rash or lesions.  GU: no dysuria, change in color of urine, no urgency or frequency.  No flank pain, no hematuria   MS:  No joint pain or swelling.  No decreased range of motion.  No back pain.    Physical Exam  BP (!) 142/78 (BP Location: Right Arm, Patient Position: Sitting, Cuff Size: Normal)   Pulse 78   Temp 97.9 F (36.6 C) (Oral)   Ht '5\' 7"'$  (1.702 m)   Wt 156 lb 3.2 oz (70.9 kg)   SpO2 97%   BMI 24.46 kg/m   GEN: A/Ox3; pleasant , NAD, well nourished    HEENT:  Pump Back/AT,  NOSE-clear, THROAT-clear, no lesions, no postnasal drip or exudate  noted. Class 2-3 MP airway   NECK:  Supple w/ fair ROM; no JVD; normal carotid impulses w/o bruits; no thyromegaly or nodules palpated; no lymphadenopathy.    RESP  Clear  P & A; w/o, wheezes/ rales/ or rhonchi. no accessory muscle use, no dullness to percussion  CARD:  RRR, no m/r/g, no peripheral edema, pulses intact, no cyanosis or clubbing.  GI:   Soft & nt; nml bowel sounds; no organomegaly or masses detected.   Musco: Warm bil, no deformities or joint swelling noted.   Neuro: alert, no focal deficits noted.    Skin: Warm, no lesions or rashes    Lab Results:  CBC    Component Value Date/Time   WBC 5.9 10/22/2021  1321   RBC 4.90 10/22/2021 1321   HGB 14.5 10/22/2021 1321   HCT 44.1 10/22/2021 1321   PLT 297.0 10/22/2021 1321   MCV 90.0 10/22/2021 1321   MCHC 32.8 10/22/2021 1321   RDW 12.6 10/22/2021 1321   LYMPHSABS 2.0 10/22/2021 1321   MONOABS 0.5 10/22/2021 1321   EOSABS 0.2 10/22/2021 1321   BASOSABS 0.0 10/22/2021 1321    BMET    Component Value Date/Time   NA 140 10/22/2021 1321   NA 139 06/28/2014 0000   K 4.2 10/22/2021 1321   CL 105 10/22/2021 1321   CO2 29 10/22/2021 1321   GLUCOSE 87 10/22/2021 1321   BUN 20 10/22/2021 1321   BUN 20 06/28/2014 0000   CREATININE 0.95 10/22/2021 1321   CALCIUM 9.4 10/22/2021 1321    BNP No results found for: "BNP"  ProBNP No results found for: "PROBNP"  Imaging: CT CARDIAC SCORING  Addendum Date: 08/03/2022   ADDENDUM REPORT: 08/03/2022 08:57 EXAM: OVER-READ INTERPRETATION  PET-CT CHEST The following report is an over-read performed by radiologist Dr. Rosine Beat [facility] on 08/03/2022. This over-read does not include interpretation of cardiac or coronary anatomy or pathology. The cardiac CT interpretation by the cardiologist is to be attached. COMPARISON:  None. FINDINGS: No evidence for lymphadenopathy within the visualized mediastinum or hilar regions. Tree-in-bud nodularity identified  in both upper lobes and the left lower lobe is associated with bronchiectasis and bronchial wall thickening in both upper lobes, the right middle lobe, and visualized portions of the anterior lower lobes. Scattered areas of peripheral small airway impaction evident. Visualized portions of the upper abdomen are unremarkable. No suspicious lytic or sclerotic osseous abnormality. IMPRESSION: Bilateral tree-in-bud nodularity with associated bronchiectasis and bronchial wall thickening and peripheral small airway impaction. Imaging features are compatible with atypical infection, including MAI. Consider follow-up CT chest without contrast in 3 months after therapy to assess for resolution. These results will be called to the ordering clinician or representative by the Radiologist Assistant, and communication documented in the PACS or [employer]. Electronically Signed   By: Misty Stanley M.D.   On: 08/03/2022 08:57   Result Date: 08/03/2022 : Cardiovascular Disease Risk stratification EXAM: Coronary Calcium Score TECHNIQUE: A gated, non-contrast computed tomography scan of the heart was performed using 16m slice thickness. Axial images were analyzed on a dedicated workstation. Calcium scoring of the coronary arteries was performed using the Agatston method. FINDINGS: Coronary arteries: Normal origins. Coronary Calcium Score: Left main: 0 Left anterior descending artery: 0 Left circumflex artery: 0 Right coronary artery: 0 Total: 0 Percentile: 0 Pericardium: Normal. Ascending Aorta: Normal caliber. Non-cardiac: [facility]. IMPRESSION: Coronary calcium score of 0. This was 0 percentile for age-, race-, and sex-matched controls. RECOMMENDATIONS: Coronary artery calcium (CAC) score is a strong predictor of incident coronary heart disease (CHD) and provides predictive information beyond traditional risk factors. CAC scoring is reasonable to use in the decision to withhold, postpone,  or initiate statin therapy in intermediate-risk or selected borderline-risk asymptomatic adults (age 68-75years and LDL-C >=70 to <190 mg/dL) who do not have diabetes or established atherosclerotic cardiovascular disease (ASCVD).* In intermediate-risk (10-year ASCVD risk >=7.5% to <20%) adults or selected borderline-risk (10-year ASCVD risk >=5% to <7.5%) adults in whom a CAC score is measured for the purpose of making a treatment decision the following recommendations have been made: If CAC=0, it is reasonable to withhold statin therapy and reassess in 5 to 10 years, as  long as higher risk conditions are absent (diabetes mellitus, family history of premature CHD in first degree relatives (males <55 years; females <65 years), cigarette smoking, or LDL >=190 mg/dL). If CAC is 1 to 99, it is reasonable to initiate statin therapy for patients >=75 years of age. If CAC is >=100 or >=75th percentile, it is reasonable to initiate statin therapy at any age. Cardiology referral should be considered for patients with CAC scores >=400 or >=75th percentile. *2018 AHA/ACC/AACVPR/AAPA/ABC/ACPM/ADA/AGS/APhA/ASPC/NLA/PCNA Guideline on the Management of Blood Cholesterol: A Report of the American College of Cardiology/American Heart Association Task Force on Clinical Practice Guidelines. J Am Coll Cardiol. 2019;73(24):3168-3209. Berniece Salines, DO [facility] The noncardiac portion of this study will be interpreted in separate report by the radiologist. Electronically Signed: By: Berniece Salines D.O. On: 08/02/2022 16:57    trastuzumab-hyaluronidase-oysk (HERCEPTIN HYLECTA) 600-10000 MG-UNT/5ML chemo SQ injection 600 mg     Date Action Dose Route User   06/30/2022 1158 Given 600 mg Subcutaneous (Right Anterior Thigh) Kerscher, Lacey Jensen, RN      trastuzumab-hyaluronidase-oysk (HERCEPTIN HYLECTA) 600-10000 MG-UNT/5ML chemo SQ injection 600 mg     Date Action Dose Route User   07/21/2022 1555 Given 600 mg Subcutaneous (Left Anterior  Thigh) Person, Brooke A, RN      trastuzumab-hyaluronidase-oysk (HERCEPTIN HYLECTA) 600-10000 MG-UNT/5ML chemo SQ injection 600 mg     Date Action Dose Route User   08/11/2022 1539 Given 600 mg Subcutaneous (Right Anterior Thigh) Anhaiser, Guadelupe Sabin, RN           No data to display          No results found for: "NITRICOXIDE"      Assessment & Plan:   Snoring Snoring, daytime sleepiness, fatigue, restless sleep all concerning for underlying sleep apnea.  We will set patient up for home sleep study - discussed how weight can impact sleep and risk for sleep disordered breathing - discussed options to assist with weight loss: combination of diet modification, cardiovascular and strength training exercises   - had an extensive discussion regarding the adverse health consequences related to untreated sleep disordered breathing - specifically discussed the risks for hypertension, coronary artery disease, cardiac dysrhythmias, cerebrovascular disease, and diabetes - lifestyle modification discussed   - discussed how sleep disruption can increase risk of accidents, particularly when driving - safe driving practices were discussed   Plan  Patient Instructions  Set up for home sleep study  Healthy sleep regimen  Do not drive is sleepy   Set up HRCT Chest  Flutter valve Twice daily   Activity as tolerated.   Follow in 6 weeks with Dr. Halford Chessman  with PFTs and As needed       Bronchiectasis without complication [facility]) Incidental finding of bronchiectasis noted on CT scan with tree-in-bud nodularity, bronchial wall thickening small airway impaction.  Patient is asymptomatic with no cough shortness of breath or history of lung disease.  She is very active physically. We will check a dedicated CT chest high-res and PFTs.  Add flutter valve to use twice daily. Patient has no cough or congestion unable to get sputum culture. Hold on labs for now , consider immune panel/repeat CBC,  on return if needed   Plan . Patient Instructions  Set up for home sleep study  Healthy sleep regimen  Do not drive is sleepy   Set up HRCT Chest  Flutter valve Twice daily   Activity as tolerated.   Follow in 6 weeks with Dr. Halford Chessman  with PFTs and As needed         Rexene Edison, NP 08/13/2022

## 2022-08-13 NOTE — Assessment & Plan Note (Signed)
Snoring, daytime sleepiness, fatigue, restless sleep all concerning for underlying sleep apnea.  We will set patient up for home sleep study - discussed how weight can impact sleep and risk for sleep disordered breathing - discussed options to assist with weight loss: combination of diet modification, cardiovascular and strength training exercises   - had an extensive discussion regarding the adverse health consequences related to untreated sleep disordered breathing - specifically discussed the risks for hypertension, coronary artery disease, cardiac dysrhythmias, cerebrovascular disease, and diabetes - lifestyle modification discussed   - discussed how sleep disruption can increase risk of accidents, particularly when driving - safe driving practices were discussed   Plan  Patient Instructions  Set up for home sleep study  Healthy sleep regimen  Do not drive is sleepy   Set up HRCT Chest  Flutter valve Twice daily   Activity as tolerated.   Follow in 6 weeks with Dr. Halford Chessman  with PFTs and As needed

## 2022-08-13 NOTE — Patient Instructions (Signed)
Set up for home sleep study  Healthy sleep regimen  Do not drive is sleepy   Set up HRCT Chest  Flutter valve Twice daily   Activity as tolerated.   Follow in 6 weeks with Dr. Halford Chessman  with PFTs and As needed

## 2022-08-13 NOTE — Assessment & Plan Note (Signed)
Incidental finding of bronchiectasis noted on CT scan with tree-in-bud nodularity, bronchial wall thickening small airway impaction.  Patient is asymptomatic with no cough shortness of breath or history of lung disease.  She is very active physically. We will check a dedicated CT chest high-res and PFTs.  Add flutter valve to use twice daily. Patient has no cough or congestion unable to get sputum culture. Hold on labs for now , consider immune panel/repeat CBC, on return if needed   Plan . Patient Instructions  Set up for home sleep study  Healthy sleep regimen  Do not drive is sleepy   Set up HRCT Chest  Flutter valve Twice daily   Activity as tolerated.   Follow in 6 weeks with Dr. Halford Chessman  with PFTs and As needed

## 2022-08-13 NOTE — Addendum Note (Signed)
Addended by: Vanessa Barbara on: 08/13/2022 03:56 PM   Modules accepted: Orders

## 2022-08-20 DIAGNOSIS — J479 Bronchiectasis, uncomplicated: Secondary | ICD-10-CM | POA: Diagnosis not present

## 2022-08-25 ENCOUNTER — Telehealth: Payer: Self-pay | Admitting: Hematology and Oncology

## 2022-08-25 ENCOUNTER — Ambulatory Visit (HOSPITAL_BASED_OUTPATIENT_CLINIC_OR_DEPARTMENT_OTHER)
Admission: RE | Admit: 2022-08-25 | Discharge: 2022-08-25 | Disposition: A | Discharge status: Home / Self Care | Payer: Medicare Other | Source: Ambulatory Visit | Attending: Family Medicine | Admitting: Family Medicine

## 2022-08-25 ENCOUNTER — Encounter: Payer: Self-pay | Admitting: Family Medicine

## 2022-08-25 ENCOUNTER — Ambulatory Visit (INDEPENDENT_AMBULATORY_CARE_PROVIDER_SITE_OTHER): Payer: Medicare Other | Admitting: Family Medicine

## 2022-08-25 VITALS — BP 138/70 | HR 70 | Temp 99.3°F | Ht 67.0 in | Wt 153.6 lb

## 2022-08-25 DIAGNOSIS — I10 Essential (primary) hypertension: Secondary | ICD-10-CM

## 2022-08-25 DIAGNOSIS — R519 Headache, unspecified: Secondary | ICD-10-CM | POA: Diagnosis not present

## 2022-08-25 DIAGNOSIS — G4485 Primary stabbing headache: Secondary | ICD-10-CM | POA: Diagnosis not present

## 2022-08-25 DIAGNOSIS — C50412 Malignant neoplasm of upper-outer quadrant of left female breast: Secondary | ICD-10-CM | POA: Insufficient documentation

## 2022-08-25 DIAGNOSIS — Z170 Estrogen receptor positive status [ER+]: Secondary | ICD-10-CM | POA: Diagnosis not present

## 2022-08-25 MED ORDER — GADOBUTROL 1 MMOL/ML IV SOLN
7.0000 mL | Freq: Once | INTRAVENOUS | Status: AC | PRN
  Administered 2022-08-25: 7 mL via INTRAVENOUS

## 2022-08-25 NOTE — Progress Notes (Signed)
Subjective:  Patient ID: Julie Pugh, female    DOB: 13-Feb-1956  Age: 66 y.o. MRN: [id number]  CC:  Chief Complaint  Patient presents with   Headache    Pt states she has pain in her left side of her head that started yesterday, pt state she has had it before but it went away but this time it lasted through the night    HPI Julie Pugh presents for   L sided headache: Started yesterday. Left sided. Parietal not temporal. Short spurts of electrical impulses. In past would resolve within the day. Dull HA/ fogginess daily - since the summer with breast CA treatment. Prior sharp HA few months ago.  Current HA present since yesterday. Similar shocklike pulses. Minute apart or back to back.  No jaw pain, no amaurosis fugax/vision changes or speech changes, no focal weakness. No hx of migraine. Read up on ice pick HA - feels like current sx's.   Tx: tylenol, melatonin. Min relief. Has tramadol - not taken.  Did not sleep   Traveling to [place] in 3 days for 10 day trip. Caring for sister after knee replacement.  History Patient Active Problem List   Diagnosis Date Noted   Snoring 08/13/2022   Bronchiectasis without complication ([place]) 35/36/1443   HTN (hypertension) 07/04/2022   Genetic testing 05/20/2022   Family history of breast cancer 05/05/2022   Family history of pancreatic cancer 05/05/2022   Family history of prostate cancer 05/05/2022   Elevated blood pressure reading 04/23/2022   Malignant neoplasm of upper-outer quadrant of left breast in female, estrogen receptor positive ([place]) 02/12/2022   History of COVID-19 06/12/2020   Hyperlipidemia 07/10/2015   Hypothyroidism    History of melanoma    Varicose vein    Past Medical History:  Diagnosis Date   Family history of breast cancer    Family history of pancreatic cancer    Family history of prostate cancer    Hx of melanoma of skin    Hyperlipidemia    Hypothyroidism    Dr. Jaynie Collins managing. armour thyroid '60mg'$      Melanoma ([place])    R shin 2009. sees derm q6 months   Varicose vein    Past Surgical History:  Procedure Laterality Date   BREAST BIOPSY     benign 2000-stereotactic biopsy   BREAST BIOPSY Right 03/12/2004   BREAST LUMPECTOMY WITH RADIOACTIVE SEED AND SENTINEL LYMPH NODE BIOPSY Left 03/16/2022   Procedure: LEFT BREAST LUMPECTOMY WITH RADIOACTIVE SEED AND AXILLARY SENTINEL LYMPH NODE BIOPSY;  Surgeon: Rolm Bookbinder, MD;  Location: [facility];  Service: General;  Laterality: Left;   VARICOSE VEIN SURGERY     2001   No Known Allergies Prior to Admission medications   Medication Sig Start Date End Date Taking? Authorizing Provider  amLODipine (NORVASC) 2.5 MG tablet Take 1 tablet (2.5 mg total) by mouth daily. 04/30/22  Yes [name], MD  Bioflavonoid Products (ESTER-C) 500-200-60 MG TABS Take by mouth.   Yes [provider]  Cholecalciferol (VITAMIN D) 2000 units tablet Take 5,000 Units by mouth daily.   Yes [provider]  diphenhydramine-acetaminophen (TYLENOL PM) 25-500 MG TABS tablet Take 1 tablet by mouth at bedtime as needed.   Yes [provider]  letrozole (FEMARA) 2.5 MG tablet Take 1 tablet (2.5 mg total) by mouth daily. 03/29/22  Yes Nicholas Lose, MD  levothyroxine (SYNTHROID) 100 MCG tablet TAKE 1 TABLET(100 MCG) BY MOUTH DAILY 06/25/22  Yes Gherghe,  Salena Saner, MD  METRONIDAZOLE, TOPICAL, 0.75 % LOTN  05/30/19  Yes [provider]   Social History   Socioeconomic History   Marital status: Married    Spouse name: Not on file   Number of children: Not on file   Years of education: Not on file   Highest education level: Not on file  Occupational History   Not on file  Tobacco Use   Smoking status: Never    Passive exposure: Past   Smokeless tobacco: Never  Vaping Use   Vaping Use: Never used  Substance and Sexual Activity   Alcohol use: Yes    Alcohol/week: 4.0 standard drinks of alcohol    Types: 4 Standard  drinks or equivalent per week   Drug use: No   Sexual activity: Yes    Birth control/protection: Post-menopausal  Other Topics Concern   Not on file  Social History Narrative   Married (marriage is a stressor). 1 daughter Cloyde Reams at [employer] age 47 in 2016.       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       Hobbies: tennis, walking, time with dogs, pickleball. Reading. Outdoors.    Social Determinants of Health   Financial Resource Strain: Not on file  Food Insecurity: Not on file  Transportation Needs: Not on file  Physical Activity: Not on file  Stress: Not on file  Social Connections: Not on file  Intimate Partner Violence: Not on file    Review of Systems  Per HPI.  Objective:   Vitals:   08/25/22 1048 08/25/22 1107  BP: (!) 142/70 138/70  Pulse: 70   Temp: 99.3 F (37.4 C)   SpO2: 97%   Weight: 153 lb 9.6 oz (69.7 kg)   Height: '5\' 7"'$  (1.702 m)      Physical Exam Vitals reviewed.  Constitutional:      Appearance: Normal appearance. She is well-developed.  HENT:     Head: Normocephalic and atraumatic.  Eyes:     General: No visual field deficit.    Conjunctiva/sclera: Conjunctivae normal.     Pupils: Pupils are equal, round, and reactive to light.  Neck:     Vascular: No carotid bruit.  Cardiovascular:     Rate and Rhythm: Normal rate and regular rhythm.     Heart sounds: Normal heart sounds.  Pulmonary:     Effort: Pulmonary effort is normal.     Breath sounds: Normal breath sounds.  Abdominal:     Palpations: Abdomen is soft. There is no pulsatile mass.     Tenderness: There is no abdominal tenderness.  Musculoskeletal:     Right lower leg: No edema.     Left lower leg: No edema.  Skin:    General: Skin is warm and dry.  Neurological:     Mental Status: She is alert and oriented to person, place, and time.     GCS: GCS eye subscore is 4. GCS verbal subscore is 5. GCS motor subscore is 6.     Cranial  Nerves: No cranial nerve deficit, dysarthria or facial asymmetry.     Sensory: No sensory deficit.     Motor: No weakness or pronator drift.     Coordination: Romberg sign negative. Coordination normal. Finger-Nose-Finger Test and Heel to [facility] Test normal. Rapid alternating movements normal.     Gait: Gait normal.     Comments: No cords or discomfort over left temple.  Describes area of  discomfort at the left parietal region.  Skin nontender, no rash.  Psychiatric:        Mood and Affect: Mood normal.        Behavior: Behavior normal.        Assessment & Plan:  Julie Pugh is a 66 y.o. female . Primary stabbing headache - Plan: MR Brain W Wo Contrast  Malignant neoplasm of upper-outer quadrant of left breast in female, estrogen receptor positive ([place]) - Plan: MR Brain W Wo Contrast  Primary hypertension - Plan: MR Brain W Wo Contrast New onset daily headache for months as above, with intermittent Isopaque/stabbing headache, now increased severity, duration past 24 hours.  Nonfocal neuro exam.  History of malignancy as above.  Check MRI brain today if possible.  Symptomatic care with Tylenol, rest, fluids for now.  Repeat blood pressure improved, less likely cause of headache.  ER precautions given.  No orders of the defined types were placed in this encounter.  Patient Instructions    Based on current headache and previous primary stabbing headache (icepick headache) I will check MRI brain initially.  Depending on those results we will likely have you follow-up with headache specialist.  Rest today, drink plenty of fluids.  Tylenol is fine for now.  Hang in there.  Return to the clinic or go to the nearest emergency room if any of your symptoms worsen or new symptoms occur.  General Headache Without Cause A headache is pain or discomfort felt around the head or neck area. There are many causes and types of headaches. A few common types include: Tension headaches. Migraine  headaches. Cluster headaches. Chronic daily headaches. Sometimes, the specific cause of a headache may not be found. Follow these instructions at home: Watch your condition for any changes. Let your health care provider know about them. Take these steps to help with your condition: Managing pain     Take over-the-counter and prescription medicines only as told by your health care provider. Treatment may include medicines for pain that are taken by mouth or applied to the skin. Lie down in a dark, quiet room when you have a headache. Keep lights dim if bright lights bother you or make your headaches worse. If directed, put ice on your head and neck area: Put ice in a plastic bag. Place a towel between your skin and the bag. Leave the ice on for 20 minutes, 2-3 times per day. Remove the ice if your skin turns bright red. This is very important. If you cannot feel pain, heat, or cold, you have a greater risk of damage to the area. If directed, apply heat to the affected area. Use the heat source that your health care provider recommends, such as a moist heat pack or a heating pad. Place a towel between your skin and the heat source. Leave the heat on for 20-30 minutes. Remove the heat if your skin turns bright red. This is especially important if you are unable to feel pain, heat, or cold. You have a greater risk of getting burned. Eating and drinking Eat meals on a regular schedule. If you drink alcohol: Limit how much you have to: 0-1 drink a day for women who are not pregnant. 0-2 drinks a day for men. Know how much alcohol is in a drink. In the [place] one drink equals one 12 oz bottle of beer (355 mL), one 5 oz glass of wine (148 mL), or one 1 oz glass of hard liquor (44  mL). Stop drinking caffeine, or decrease the amount of caffeine you drink. Drink enough fluid to keep your urine pale yellow. General instructions  Keep a headache journal to help find out what may trigger your  headaches. For example, write down: What you eat and drink. How much sleep you get. Any change to your diet or medicines. Try massage or other relaxation techniques. Limit stress. Sit up straight, and do not tense your muscles. Do not use any products that contain nicotine or tobacco. These products include cigarettes, chewing tobacco, and vaping devices, such as e-cigarettes. If you need help quitting, ask your health care provider. Exercise regularly as told by your health care provider. Sleep on a regular schedule. Get 7-9 hours of sleep each night, or the amount recommended by your health care provider. Keep all follow-up visits. This is important. Contact a health care provider if: Medicine does not help your symptoms. You have a headache that is different from your usual headache. You have nausea or you vomit. You have a fever. Get help right away if: Your headache: Becomes severe quickly. Gets worse after moderate to intense physical activity. You have any of these symptoms: Repeated vomiting. Pain or stiffness in your neck. Changes to your vision. Pain in an eye or ear. Problems with speech. Muscular weakness or loss of muscle control. Loss of balance or coordination. You feel faint or pass out. You have confusion. You have a seizure. These symptoms may represent a serious problem that is an emergency. Do not wait to see if the symptoms will go away. Get medical help right away. Call your local emergency services (911 in the [place] Do not drive yourself to the hospital. Summary A headache is pain or discomfort felt around the head or neck area. There are many causes and types of headaches. In some cases, the cause may not be found. Keep a headache journal to help find out what may trigger your headaches. Watch your condition for any changes. Let your health care provider know about them. Contact a health care provider if you have a headache that is different from the usual  headache, or if your symptoms are not helped by medicine. Get help right away if your headache becomes severe, you vomit, you have a loss of vision, you lose your balance, or you have a seizure. This information is not intended to replace advice given to you by your health care provider. Make sure you discuss any questions you have with your health care provider. Document Revised: 02/25/2021 Document Reviewed: 02/25/2021 Elsevier Patient Education  [place],   Merri Ray, MD [place], [facility] 08/25/22 11:48 AM

## 2022-08-25 NOTE — Telephone Encounter (Signed)
Rescheduled appointment per provider [facility]. Left voicemail. 

## 2022-08-25 NOTE — Patient Instructions (Signed)
Based on current headache and previous primary stabbing headache (icepick headache) I will check MRI brain initially.  Depending on those results we will likely have you follow-up with headache specialist.  Rest today, drink plenty of fluids.  Tylenol is fine for now.  Hang in there.  Return to the clinic or go to the nearest emergency room if any of your symptoms worsen or new symptoms occur.  General Headache Without Cause A headache is pain or discomfort felt around the head or neck area. There are many causes and types of headaches. A few common types include: Tension headaches. Migraine headaches. Cluster headaches. Chronic daily headaches. Sometimes, the specific cause of a headache may not be found. Follow these instructions at home: Watch your condition for any changes. Let your health care provider know about them. Take these steps to help with your condition: Managing pain     Take over-the-counter and prescription medicines only as told by your health care provider. Treatment may include medicines for pain that are taken by mouth or applied to the skin. Lie down in a dark, quiet room when you have a headache. Keep lights dim if bright lights bother you or make your headaches worse. If directed, put ice on your head and neck area: Put ice in a plastic bag. Place a towel between your skin and the bag. Leave the ice on for 20 minutes, 2-3 times per day. Remove the ice if your skin turns bright red. This is very important. If you cannot feel pain, heat, or cold, you have a greater risk of damage to the area. If directed, apply heat to the affected area. Use the heat source that your health care provider recommends, such as a moist heat pack or a heating pad. Place a towel between your skin and the heat source. Leave the heat on for 20-30 minutes. Remove the heat if your skin turns bright red. This is especially important if you are unable to feel pain, heat, or cold. You have a  greater risk of getting burned. Eating and drinking Eat meals on a regular schedule. If you drink alcohol: Limit how much you have to: 0-1 drink a day for women who are not pregnant. 0-2 drinks a day for men. Know how much alcohol is in a drink. In the [place] one drink equals one 12 oz bottle of beer (355 mL), one 5 oz glass of wine (148 mL), or one 1 oz glass of hard liquor (44 mL). Stop drinking caffeine, or decrease the amount of caffeine you drink. Drink enough fluid to keep your urine pale yellow. General instructions  Keep a headache journal to help find out what may trigger your headaches. For example, write down: What you eat and drink. How much sleep you get. Any change to your diet or medicines. Try massage or other relaxation techniques. Limit stress. Sit up straight, and do not tense your muscles. Do not use any products that contain nicotine or tobacco. These products include cigarettes, chewing tobacco, and vaping devices, such as e-cigarettes. If you need help quitting, ask your health care provider. Exercise regularly as told by your health care provider. Sleep on a regular schedule. Get 7-9 hours of sleep each night, or the amount recommended by your health care provider. Keep all follow-up visits. This is important. Contact a health care provider if: Medicine does not help your symptoms. You have a headache that is different from your usual headache. You have nausea or you vomit.  You have a fever. Get help right away if: Your headache: Becomes severe quickly. Gets worse after moderate to intense physical activity. You have any of these symptoms: Repeated vomiting. Pain or stiffness in your neck. Changes to your vision. Pain in an eye or ear. Problems with speech. Muscular weakness or loss of muscle control. Loss of balance or coordination. You feel faint or pass out. You have confusion. You have a seizure. These symptoms may represent a serious problem  that is an emergency. Do not wait to see if the symptoms will go away. Get medical help right away. Call your local emergency services (911 in the [place] Do not drive yourself to the hospital. Summary A headache is pain or discomfort felt around the head or neck area. There are many causes and types of headaches. In some cases, the cause may not be found. Keep a headache journal to help find out what may trigger your headaches. Watch your condition for any changes. Let your health care provider know about them. Contact a health care provider if you have a headache that is different from the usual headache, or if your symptoms are not helped by medicine. Get help right away if your headache becomes severe, you vomit, you have a loss of vision, you lose your balance, or you have a seizure. This information is not intended to replace advice given to you by your health care provider. Make sure you discuss any questions you have with your health care provider. Document Revised: 02/25/2021 Document Reviewed: 02/25/2021 Elsevier Patient Education  [place].

## 2022-08-27 ENCOUNTER — Encounter: Payer: Self-pay | Admitting: Family Medicine

## 2022-09-05 NOTE — Progress Notes (Signed)
Patient Care Team: [name], MD as PCP - General (Family Medicine) Philemon Kingdom, MD as Consulting Physician (Internal Medicine) Nicholas Lose, MD as Medical Oncologist (Hematology and Oncology)  DIAGNOSIS:  Encounter Diagnosis  Name Primary?   Malignant neoplasm of upper-outer quadrant of left breast in female, estrogen receptor positive ([place]) Yes    SUMMARY OF ONCOLOGIC HISTORY: Oncology History  Malignant neoplasm of upper-outer quadrant of left breast in female, estrogen receptor positive ([place])  02/12/2022 Initial Diagnosis   Screening mammogram detected left breast mass, by ultrasound measured 0.6 cm, biopsy revealed grade 2 IDC ER 100%, PR 2%, HER2 equivocal by IHC and FISH positive, Ki-67 30%   02/17/2022 Cancer Staging   Staging form: Breast, AJCC 8th Edition - Clinical: Stage IA (cT1b, cN0, cM0, G2, ER+, PR-, HER2+) - Signed by Nicholas Lose, MD on 02/17/2022 Stage prefix: Initial diagnosis Histologic grading system: 3 grade system   03/16/2022 Surgery   Left lumpectomy: Grade 3 IDC 5 mm, negative for lymphovascular invasion, ER 100%, PR 2%, HER2 FISH positive, margins negative, 0/2 lymph nodes negative   03/29/2022 -  Anti-estrogen oral therapy   Letrozole daily   04/07/2022 - 05/19/2022 Chemotherapy   Patient is on Treatment Plan : BREAST Trastuzumab q21d x 13 cycles     04/07/2022 -  Chemotherapy   Patient is on Treatment Plan : BREAST Trastuzumab IV (8/6) or SQ (600) D1 q21d     04/29/2022 - 05/26/2022 Radiation Therapy   Adjuvant Radiation therapy   05/05/2022 Genetic Testing   Referral pending   05/09/2022 Cancer Staging   Staging form: Breast, AJCC 8th Edition - Pathologic: Stage IA (pT1a, pN0, cM0, G3, ER+, PR-, HER2+) - Signed by Gardenia Phlegm, NP on 05/09/2022 Histologic grading system: 3 grade system   05/13/2022 Genetic Testing   Negative genetic testing on the CancerNext-Expanded+RNAinsight panel.  The report date is May 13, 2022.  The CancerNext-Expanded gene panel offered by [facility] and includes sequencing and rearrangement analysis for the following 77 genes: AIP, ALK, APC*, ATM*, AXIN2, BAP1, BARD1, BLM, BMPR1A, BRCA1*, BRCA2*, BRIP1*, CDC73, CDH1*, CDK4, CDKN1B, CDKN2A, CHEK2*, CTNNA1, DICER1, FANCC, FH, FLCN, GALNT12, KIF1B, LZTR1, MAX, MEN1, MET, MLH1*, MSH2*, MSH3, MSH6*, MUTYH*, NBN, NF1*, NF2, NTHL1, PALB2*, PHOX2B, PMS2*, POT1, PRKAR1A, PTCH1, PTEN*, RAD51C*, RAD51D*, RB1, RECQL, RET, SDHA, SDHAF2, SDHB, SDHC, SDHD, SMAD4, SMARCA4, SMARCB1, SMARCE1, STK11, SUFU, TMEM127, TP53*, TSC1, TSC2, VHL and XRCC2 (sequencing and deletion/duplication); EGFR, EGLN1, HOXB13, KIT, MITF, PDGFRA, POLD1, and POLE (sequencing only); EPCAM and GREM1 (deletion/duplication only). DNA and RNA analyses performed for * genes.      CHIEF COMPLIANT:  Follow-up left lumpectomy on herceptin and letrozole   INTERVAL HISTORY: Julie Pugh is a 66 y.o with the above mention left lumpectomy on herceptin and letrozole. She presents to the clinic today for a follow-up. She tolerating the letrozole extremely well. Her only concern was her Venous vein was bothering her. But overall she was doing good.   ALLERGIES:  has No Known Allergies.  MEDICATIONS:  Current Outpatient Medications  Medication Sig Dispense Refill   amLODipine (NORVASC) 2.5 MG tablet Take 1 tablet (2.5 mg total) by mouth daily. 90 tablet 1   Bioflavonoid Products (ESTER-C) 500-200-60 MG TABS Take by mouth.     Cholecalciferol (VITAMIN D) 2000 units tablet Take 5,000 Units by mouth daily.     diphenhydramine-acetaminophen (TYLENOL PM) 25-500 MG TABS tablet Take 1 tablet by mouth at bedtime as needed.  letrozole (FEMARA) 2.5 MG tablet Take 1 tablet (2.5 mg total) by mouth daily. 90 tablet 3   levothyroxine (SYNTHROID) 100 MCG tablet TAKE 1 TABLET(100 MCG) BY MOUTH DAILY 90 tablet 3   METRONIDAZOLE, TOPICAL, 0.75 % LOTN      No current facility-administered  medications for this visit.    PHYSICAL EXAMINATION: ECOG PERFORMANCE STATUS: 1 - Symptomatic but completely ambulatory  There were no vitals filed for this visit. There were no vitals filed for this visit.    LABORATORY DATA:  I have reviewed the data as listed    Latest Ref Rng & Units 10/22/2021    1:21 PM 10/08/2020    9:07 AM 06/13/2020    8:30 AM  CMP  Glucose 70 - 99 mg/dL 87  89  79   BUN 6 - 23 mg/dL _0 Creatinine 0.40 - 1.20 mg/dL 0.95  0.87  0.91   Sodium 135 - 145 mEq/L 140  140  141   Potassium 3.5 - 5.1 mEq/L 4.2  4.4  4.3   Chloride 96 - 112 mEq/L 105  105  105   CO2 19 - 32 mEq/L _1 Calcium 8.4 - 10.5 mg/dL 9.4  9.4  9.2   Total Protein 6.0 - 8.3 g/dL 7.2  7.0  6.3   Total Bilirubin 0.2 - 1.2 mg/dL 0.5  0.7  0.6   Alkaline Phos 39 - 117 U/L 60  65  53   AST 0 - 37 U/L _2 ALT 0 - 35 U/L _3 Lab Results  Component Value Date   WBC 5.9 10/22/2021   HGB 14.5 10/22/2021   HCT 44.1 10/22/2021   MCV 90.0 10/22/2021   PLT 297.0 10/22/2021   NEUTROABS 3.1 10/22/2021    ASSESSMENT & PLAN:  Malignant neoplasm of upper-outer quadrant of left breast in female, estrogen receptor positive ([place]) 02/12/2022:Screening mammogram detected left breast mass, by ultrasound measured 0.6 cm, biopsy revealed grade 2 IDC ER 100%, PR 2%, HER2 equivocal by IHC and FISH positive, Ki-67 30%     Treatment plan: 1.  03/16/2022 Left lumpectomy: Grade 3 IDC 5 mm (6 mm on biopsy), negative for lymphovascular invasion, ER 100%, PR 2%, HER2 FISH positive, margins negative, 0/2 lymph nodes negative 2.  based on only 6 mm tumor size, we discussed the pros and cons of systemic treatments and decided to treat her with Herceptin with letrozole. 3.  Adjuvant radiation completed 05/26/2022 4.  Followed by adjuvant antiestrogen therapy ---------------------------------------------------------------------------- Current treatment: Herceptin started 04/07/2022  with letrozole Herceptin toxicities: Denies any adverse effects   Letrozole toxicities: Denies any hot flashes or arthralgias Right leg medial aspect there appears to be a prominent superficial vein.  It was slightly tender.    Return to clinic every 3 weeks for Herceptin injections every 6 weeks and follow-up with me.    No orders of the defined types were placed in this encounter.  The patient has a good understanding of the overall plan. she agrees with it. she will call with any problems that may develop before the next visit here. Total time spent: 30 mins including face to face time and time spent for planning, charting and co-ordination of care   Harriette Ohara, MD 09/08/22    I Gardiner Coins am scribing for Dr. Lindi Adie  I have reviewed the above documentation  for accuracy and completeness, and I agree with the above.

## 2022-09-08 ENCOUNTER — Inpatient Hospital Stay: Payer: Medicare Other | Admitting: Hematology and Oncology

## 2022-09-08 ENCOUNTER — Inpatient Hospital Stay: Payer: Medicare Other

## 2022-09-08 VITALS — BP 138/72 | HR 72 | Temp 98.2°F | Resp 18

## 2022-09-08 DIAGNOSIS — C50412 Malignant neoplasm of upper-outer quadrant of left female breast: Secondary | ICD-10-CM

## 2022-09-08 DIAGNOSIS — Z170 Estrogen receptor positive status [ER+]: Secondary | ICD-10-CM | POA: Diagnosis not present

## 2022-09-08 DIAGNOSIS — Z5112 Encounter for antineoplastic immunotherapy: Secondary | ICD-10-CM | POA: Diagnosis not present

## 2022-09-08 DIAGNOSIS — Z79899 Other long term (current) drug therapy: Secondary | ICD-10-CM | POA: Diagnosis not present

## 2022-09-08 DIAGNOSIS — Z79811 Long term (current) use of aromatase inhibitors: Secondary | ICD-10-CM | POA: Diagnosis not present

## 2022-09-08 DIAGNOSIS — Z923 Personal history of irradiation: Secondary | ICD-10-CM | POA: Diagnosis not present

## 2022-09-08 MED ORDER — TRASTUZUMAB-HYALURONIDASE-OYSK 600-10000 MG-UNT/5ML ~~LOC~~ SOLN
600.0000 mg | Freq: Once | SUBCUTANEOUS | Status: AC
  Administered 2022-09-08: 600 mg via SUBCUTANEOUS
  Filled 2022-09-08: qty 5

## 2022-09-08 NOTE — Assessment & Plan Note (Addendum)
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  03/16/2022 Left lumpectomy: Grade 3 IDC 5 mm (6 mm on biopsy), negative for lymphovascular invasion, ER 100%, PR 2%, HER2 FISH positive, margins negative, 0/2 lymph nodes negative 2.  based on only 6 mm tumor size, we discussed the pros and cons of systemic treatments and decided to treat her with Herceptin with letrozole. 3.  Adjuvant radiation completed 05/26/2022 4.  Followed by adjuvant antiestrogen therapy ---------------------------------------------------------------------------- Current treatment: Herceptin started 04/07/2022 with letrozole Herceptin toxicities: Denies any adverse effects   Letrozole toxicities: Denies any hot flashes or arthralgias Right leg medial aspect there appears to be a prominent superficial vein.  It was slightly tender.    Return to clinic every 3 weeks for Herceptin injections every 6 weeks and follow-up with me.

## 2022-09-08 NOTE — Patient Instructions (Signed)
[facility] MEDICAL [facility]COLOGY  Discharge Instructions: Thank you for choosing [facility] to provide your oncology and hematology care.   If you have a lab appointment with the Cancer Center, please go directly to the Cancer Center and check in at the registration area.   Wear comfortable clothing and clothing appropriate for easy access to any Portacath or PICC line.   We strive to give you quality time with your provider. You may need to reschedule your appointment if you arrive late (15 or more minutes).  Arriving late affects you and other patients whose appointments are after yours.  Also, if you miss three or more appointments without notifying the office, you may be dismissed from the clinic at the provider's discretion.      For prescription refill requests, have your pharmacy contact our office and allow 72 hours for refills to be completed.    Today you received the following chemotherapy and/or immunotherapy agents: herceptin hylecta      To help prevent nausea and vomiting after your treatment, we encourage you to take your nausea medication as directed.  BELOW ARE SYMPTOMS THAT SHOULD BE REPORTED IMMEDIATELY: *FEVER GREATER THAN 100.4 F (38 C) OR HIGHER *CHILLS OR SWEATING *NAUSEA AND VOMITING THAT IS NOT CONTROLLED WITH YOUR NAUSEA MEDICATION *UNUSUAL SHORTNESS OF BREATH *UNUSUAL BRUISING OR BLEEDING *URINARY PROBLEMS (pain or burning when urinating, or frequent urination) *BOWEL PROBLEMS (unusual diarrhea, constipation, pain near the anus) TENDERNESS IN MOUTH AND THROAT WITH OR WITHOUT PRESENCE OF ULCERS (sore throat, sores in mouth, or a toothache) UNUSUAL RASH, SWELLING OR PAIN  UNUSUAL VAGINAL DISCHARGE OR ITCHING   Items with * indicate a potential emergency and should be followed up as soon as possible or go to the Emergency Department if any problems should occur.  Please show the CHEMOTHERAPY ALERT CARD or IMMUNOTHERAPY ALERT CARD at  check-in to the Emergency Department and triage nurse.  Should you have questions after your visit or need to cancel or reschedule your appointment, please contact [facility]  Dept: [phone]  and follow the prompts.  Office hours are 8:00 a.m. to 4:30 p.m. Monday - Friday. Please note that voicemails left after 4:00 p.m. may not be returned until the following business day.  We are closed weekends and major holidays. You have access to a nurse at all times for urgent questions. Please call the main number to the clinic Dept: [phone] and follow the prompts.   For any non-urgent questions, you may also contact your provider using MyChart. We now offer e-Visits for anyone 18 and older to request care online for non-urgent symptoms. For details visit [email or web address].   Also download the MyChart app! Go to the app store, search "MyChart", open the app, select [facility], and log in with your MyChart username and password.  Masks are optional in the cancer centers. If you would like for your care team to wear a mask while they are taking care of you, please let them know. You may have one support person who is at least 68 years old accompany you for your appointments. 

## 2022-09-17 DIAGNOSIS — I82811 Embolism and thrombosis of superficial veins of right lower extremities: Secondary | ICD-10-CM | POA: Diagnosis not present

## 2022-09-17 DIAGNOSIS — I8002 Phlebitis and thrombophlebitis of superficial vessels of left lower extremity: Secondary | ICD-10-CM | POA: Diagnosis not present

## 2022-09-17 DIAGNOSIS — I8001 Phlebitis and thrombophlebitis of superficial vessels of right lower extremity: Secondary | ICD-10-CM | POA: Diagnosis not present

## 2022-09-20 ENCOUNTER — Other Ambulatory Visit: Payer: Medicare Other

## 2022-09-21 ENCOUNTER — Ambulatory Visit
Admission: RE | Admit: 2022-09-21 | Discharge: 2022-09-21 | Disposition: A | Discharge status: Home / Self Care | Payer: Medicare Other | Source: Ambulatory Visit | Attending: Hematology and Oncology | Admitting: Hematology and Oncology

## 2022-09-21 DIAGNOSIS — Z170 Estrogen receptor positive status [ER+]: Secondary | ICD-10-CM

## 2022-09-21 DIAGNOSIS — Z780 Asymptomatic menopausal state: Secondary | ICD-10-CM | POA: Diagnosis not present

## 2022-09-29 ENCOUNTER — Inpatient Hospital Stay: Payer: Medicare Other | Attending: Hematology and Oncology

## 2022-09-29 ENCOUNTER — Other Ambulatory Visit: Payer: Self-pay

## 2022-09-29 VITALS — BP 140/76 | HR 73 | Temp 97.9°F | Resp 18 | Wt 153.1 lb

## 2022-09-29 DIAGNOSIS — Z79899 Other long term (current) drug therapy: Secondary | ICD-10-CM | POA: Insufficient documentation

## 2022-09-29 DIAGNOSIS — Z79811 Long term (current) use of aromatase inhibitors: Secondary | ICD-10-CM | POA: Insufficient documentation

## 2022-09-29 DIAGNOSIS — Z5112 Encounter for antineoplastic immunotherapy: Secondary | ICD-10-CM | POA: Insufficient documentation

## 2022-09-29 DIAGNOSIS — Z170 Estrogen receptor positive status [ER+]: Secondary | ICD-10-CM | POA: Diagnosis not present

## 2022-09-29 DIAGNOSIS — C50412 Malignant neoplasm of upper-outer quadrant of left female breast: Secondary | ICD-10-CM | POA: Diagnosis not present

## 2022-09-29 DIAGNOSIS — Z923 Personal history of irradiation: Secondary | ICD-10-CM | POA: Insufficient documentation

## 2022-09-29 MED ORDER — TRASTUZUMAB-HYALURONIDASE-OYSK 600-10000 MG-UNT/5ML ~~LOC~~ SOLN
600.0000 mg | Freq: Once | SUBCUTANEOUS | Status: AC
  Administered 2022-09-29: 600 mg via SUBCUTANEOUS
  Filled 2022-09-29: qty 5

## 2022-09-29 NOTE — Patient Instructions (Signed)
[facility]ICAL [facility]COLOG[facility]  Discharge Instructions: Thank you for choosing [facility] to provide your oncology and hematology care.   If you have a lab appointment with the Cancer Center, please go directly to the Cancer Center and check in at the registration area.   Wear comfortable clothing and clothing appropriate for easy access to any Portacath or PICC line.   We strive to give you quality time with your provider. You may need to reschedule your appointment if you arrive late (15 or more minutes).  Arriving late affects you and other patients whose appointments are after yours.  Also, if you miss three or more appointments without notifying the office, you may be dismissed from the clinic at the provider's discretion.      For prescription refill requests, have your pharmacy contact our office and allow 72 hours for refills to be completed.    Today you received the following chemotherapy and/or immunotherapy agents: herceptin hylecta      To help prevent nausea and vomiting after your treatment, we encourage you to take your nausea medication as directed.  BELOW ARE SYMPTOMS THAT SHOULD BE REPORTED IMMEDIATELY: *FEVER GREATER THAN 100.4 F (38 C) OR HIGHER *CHILLS OR SWEATING *NAUSEA AND VOMITING THAT IS NOT CONTROLLED WITH YOUR NAUSEA MEDICATION *UNUSUAL SHORTNESS OF BREATH *UNUSUAL BRUISING OR BLEEDING *URINARY PROBLEMS (pain or burning when urinating, or frequent urination) *BOWEL PROBLEMS (unusual diarrhea, constipation, pain near the anus) TENDERNESS IN MOUTH AND THROAT WITH OR WITHOUT PRESENCE OF ULCERS (sore throat, sores in mouth, or a toothache) UNUSUAL RASH, SWELLING OR PAIN  UNUSUAL VAGINAL DISCHARGE OR ITCHING   Items with * indicate a potential emergency and should be followed up as soon as possible or go to the Emergency Department if any problems should occur.  Please show the CHEMOTHERAPY ALERT CARD or IMMUNOTHERAPY ALERT CARD at  check-in to the Emergency Department and triage nurse.  Should you have questions after your visit or need to cancel or reschedule your appointment, please contact  CANCER CENTER MEDICAL ONCOLOGY  Dept: [phone]  and follow the prompts.  Office hours are 8:00 a.m. to 4:30 p.m. Monday - Friday. Please note that voicemails left after 4:00 p.m. may not be returned until the following business day.  We are closed weekends and major holidays. You have access to a nurse at all times for urgent questions. Please call the main number to the clinic Dept: [phone] and follow the prompts.   For any non-urgent questions, you may also contact your provider using MyChart. We now offer e-Visits for anyone 18 and older to request care online for non-urgent symptoms. For details visit [email or web address].   Also download the MyChart app! Go to the app store, search "MyChart", open the app, select , and log in with your MyChart username and password.  Masks are optional in the cancer centers. If you would like for your care team to wear a mask while they are taking care of you, please let them know. You may have one support person who is at least 68 years old accompany you for your appointments. 

## 2022-10-12 NOTE — Progress Notes (Signed)
Patient Care Team: [name], MD as PCP - General (Family Medicine) Philemon Kingdom, MD as Consulting Physician (Internal Medicine) Nicholas Lose, MD as Medical Oncologist (Hematology and Oncology)  DIAGNOSIS:  Encounter Diagnosis  Name Primary?   Malignant neoplasm of upper-outer quadrant of left breast in female, estrogen receptor positive ([place])     SUMMARY OF ONCOLOGIC HISTORY: Oncology History  Malignant neoplasm of upper-outer quadrant of left breast in female, estrogen receptor positive ([place])  02/12/2022 Initial Diagnosis   Screening mammogram detected left breast mass, by ultrasound measured 0.6 cm, biopsy revealed grade 2 IDC ER 100%, PR 2%, HER2 equivocal by IHC and FISH positive, Ki-67 30%   02/17/2022 Cancer Staging   Staging form: Breast, AJCC 8th Edition - Clinical: Stage IA (cT1b, cN0, cM0, G2, ER+, PR-, HER2+) - Signed by Nicholas Lose, MD on 02/17/2022 Stage prefix: Initial diagnosis Histologic grading system: 3 grade system   03/16/2022 Surgery   Left lumpectomy: Grade 3 IDC 5 mm, negative for lymphovascular invasion, ER 100%, PR 2%, HER2 FISH positive, margins negative, 0/2 lymph nodes negative   03/29/2022 -  Anti-estrogen oral therapy   Letrozole daily   04/07/2022 - 05/19/2022 Chemotherapy   Patient is on Treatment Plan : BREAST Trastuzumab q21d x 13 cycles     04/07/2022 -  Chemotherapy   Patient is on Treatment Plan : BREAST Trastuzumab IV (8/6) or SQ (600) D1 q21d     04/29/2022 - 05/26/2022 Radiation Therapy   Adjuvant Radiation therapy   05/05/2022 Genetic Testing   Referral pending   05/09/2022 Cancer Staging   Staging form: Breast, AJCC 8th Edition - Pathologic: Stage IA (pT1a, pN0, cM0, G3, ER+, PR-, HER2+) - Signed by Gardenia Phlegm, NP on 05/09/2022 Histologic grading system: 3 grade system   05/13/2022 Genetic Testing   Negative genetic testing on the CancerNext-Expanded+RNAinsight panel.  The report date is May 13, 2022.  The  CancerNext-Expanded gene panel offered by [facility] and includes sequencing and rearrangement analysis for the following 77 genes: AIP, ALK, APC*, ATM*, AXIN2, BAP1, BARD1, BLM, BMPR1A, BRCA1*, BRCA2*, BRIP1*, CDC73, CDH1*, CDK4, CDKN1B, CDKN2A, CHEK2*, CTNNA1, DICER1, FANCC, FH, FLCN, GALNT12, KIF1B, LZTR1, MAX, MEN1, MET, MLH1*, MSH2*, MSH3, MSH6*, MUTYH*, NBN, NF1*, NF2, NTHL1, PALB2*, PHOX2B, PMS2*, POT1, PRKAR1A, PTCH1, PTEN*, RAD51C*, RAD51D*, RB1, RECQL, RET, SDHA, SDHAF2, SDHB, SDHC, SDHD, SMAD4, SMARCA4, SMARCB1, SMARCE1, STK11, SUFU, TMEM127, TP53*, TSC1, TSC2, VHL and XRCC2 (sequencing and deletion/duplication); EGFR, EGLN1, HOXB13, KIT, MITF, PDGFRA, POLD1, and POLE (sequencing only); EPCAM and GREM1 (deletion/duplication only). DNA and RNA analyses performed for * genes.      CHIEF COMPLIANT: Follow-up left lumpectomy on herceptin and letrozole    INTERVAL HISTORY: Jone Panebianco is a 67 y.o with the above mention left lumpectomy on herceptin and letrozole. She presents to the clinic today for a follow-up.    ALLERGIES:  has No Known Allergies.  MEDICATIONS:  Current Outpatient Medications  Medication Sig Dispense Refill   amLODipine (NORVASC) 2.5 MG tablet Take 1 tablet (2.5 mg total) by mouth daily. 90 tablet 1   Bioflavonoid Products (ESTER-C) 500-200-60 MG TABS Take by mouth.     Cholecalciferol (VITAMIN D) 2000 units tablet Take 5,000 Units by mouth daily.     diphenhydramine-acetaminophen (TYLENOL PM) 25-500 MG TABS tablet Take 1 tablet by mouth at bedtime as needed.     letrozole (FEMARA) 2.5 MG tablet Take 1 tablet (2.5 mg total) by mouth daily. 90 tablet 3   levothyroxine (SYNTHROID)  100 MCG tablet TAKE 1 TABLET(100 MCG) BY MOUTH DAILY 90 tablet 3   METRONIDAZOLE, TOPICAL, 0.75 % LOTN      No current facility-administered medications for this visit.    PHYSICAL EXAMINATION: ECOG PERFORMANCE STATUS: 1 - Symptomatic but completely ambulatory  Vitals:   10/20/22  0910  BP: (!) 140/80  Pulse: 67  Resp: 18  Temp: (!) 97.4 F (36.3 C)  SpO2: 100%   Filed Weights   10/20/22 0910  Weight: 153 lb 12.8 oz (69.8 kg)      LABORATORY DATA:  I have reviewed the data as listed    Latest Ref Rng & Units 10/20/2022    9:01 AM 10/22/2021    1:21 PM 10/08/2020    9:07 AM  CMP  Glucose 70 - 99 mg/dL 85  87  89   BUN 8 - 23 mg/dL _0 Creatinine 0.44 - 1.00 mg/dL 0.91  0.95  0.87   Sodium 135 - 145 mmol/L 139  140  140   Potassium 3.5 - 5.1 mmol/L 4.0  4.2  4.4   Chloride 98 - 111 mmol/L 107  105  105   CO2 22 - 32 mmol/L _1 Calcium 8.9 - 10.3 mg/dL 9.1  9.4  9.4   Total Protein 6.5 - 8.1 g/dL 6.8  7.2  7.0   Total Bilirubin 0.3 - 1.2 mg/dL 0.6  0.5  0.7   Alkaline Phos 38 - 126 U/L 63  60  65   AST 15 - 41 U/L _2 ALT 0 - 44 U/L _3 Lab Results  Component Value Date   WBC 4.6 10/20/2022   HGB 13.0 10/20/2022   HCT 37.8 10/20/2022   MCV 87.7 10/20/2022   PLT 220 10/20/2022   NEUTROABS 1.9 10/20/2022    ASSESSMENT & PLAN:  Malignant neoplasm of upper-outer quadrant of left breast in female, estrogen receptor positive ([place]) 02/12/2022:Screening mammogram detected left breast mass, by ultrasound measured 0.6 cm, biopsy revealed grade 2 IDC ER 100%, PR 2%, HER2 equivocal by IHC and FISH positive, Ki-67 30%     Treatment plan: 1.  03/16/2022 Left lumpectomy: Grade 3 IDC 5 mm (6 mm on biopsy), negative for lymphovascular invasion, ER 100%, PR 2%, HER2 FISH positive, margins negative, 0/2 lymph nodes negative 2.  based on only 6 mm tumor size, we discussed the pros and cons of systemic treatments and decided to treat her with Herceptin with letrozole. 3.  Adjuvant radiation completed 05/26/2022 4.  Followed by adjuvant antiestrogen therapy ---------------------------------------------------------------------------- Current treatment: Herceptin started 04/07/2022 with letrozole Herceptin toxicities: Denies any  adverse effects   Letrozole toxicities: Denies any hot flashes or arthralgias Bone density December 2023:T-score 0: Normal  Attendance partner Donella Stade is also a patient of mine. Return to clinic every 3 weeks for Herceptin injections every 6 weeks and follow-up with me.    Orders Placed This Encounter  Procedures   ECHOCARDIOGRAM COMPLETE    Standing Status:   Future    Standing Expiration Date:   10/21/2023    Order Specific Question:   Where should this test be performed    Answer:   [place]    Order Specific Question:   Perflutren DEFINITY (image enhancing agent) should be administered unless hypersensitivity or allergy exist    Answer:   Administer Perflutren    Order Specific  Question:   Reason for exam-Echo    Answer:   Chemo  Z09    Order Specific Question:   Release to patient    Answer:   Immediate   The patient has a good understanding of the overall plan. she agrees with it. she will call with any problems that may develop before the next visit here. Total time spent: 30 mins including face to face time and time spent for planning, charting and co-ordination of care   Harriette Ohara, MD 10/20/22    I Gardiner Coins am acting as a Education administrator for [employer]  I have reviewed the above documentation for accuracy and completeness, and I agree with the above.

## 2022-10-14 ENCOUNTER — Ambulatory Visit: Payer: Medicare Other

## 2022-10-14 DIAGNOSIS — G4733 Obstructive sleep apnea (adult) (pediatric): Secondary | ICD-10-CM | POA: Diagnosis not present

## 2022-10-14 DIAGNOSIS — R0683 Snoring: Secondary | ICD-10-CM

## 2022-10-15 DIAGNOSIS — G4733 Obstructive sleep apnea (adult) (pediatric): Secondary | ICD-10-CM | POA: Diagnosis not present

## 2022-10-18 ENCOUNTER — Ambulatory Visit: Payer: Medicare Other | Attending: General Surgery

## 2022-10-18 ENCOUNTER — Ambulatory Visit: Payer: Medicare Other

## 2022-10-18 VITALS — Wt 154.5 lb

## 2022-10-18 DIAGNOSIS — Z483 Aftercare following surgery for neoplasm: Secondary | ICD-10-CM | POA: Insufficient documentation

## 2022-10-18 NOTE — Therapy (Signed)
OUTPATIENT PHYSICAL THERAPY SOZO SCREENING NOTE   Patient Name: Julie Pugh MRN: [id number] DOB:January 19, 1956, 67 y.o., female Today's Date: 10/18/2022  PCP: [name], MD REFERRING PROVIDER: Rolm Bookbinder, MD   PT End of Session - 10/18/22 1517     Visit Number 2   # unchanged due to screen only   PT Start Time 1950    PT Stop Time 1519    PT Time Calculation (min) 5 min    Activity Tolerance Patient tolerated treatment well    Behavior During Therapy WFL for tasks assessed/performed             Past Medical History:  Diagnosis Date   Family history of breast cancer    Family history of pancreatic cancer    Family history of prostate cancer    Hx of melanoma of skin    Hyperlipidemia    Hypothyroidism    Dr. Jaynie Collins managing. armour thyroid '60mg'$     Melanoma ([place])    R shin 2009. sees derm q6 months   Varicose vein    Past Surgical History:  Procedure Laterality Date   BREAST BIOPSY     benign 2000-stereotactic biopsy   BREAST BIOPSY Right 03/12/2004   BREAST LUMPECTOMY WITH RADIOACTIVE SEED AND SENTINEL LYMPH NODE BIOPSY Left 03/16/2022   Procedure: LEFT BREAST LUMPECTOMY WITH RADIOACTIVE SEED AND AXILLARY SENTINEL LYMPH NODE BIOPSY;  Surgeon: Rolm Bookbinder, MD;  Location: [facility];  Service: General;  Laterality: Left;   [place] SURGERY     2001   Patient Active Problem List   Diagnosis Date Noted   Snoring 08/13/2022   Bronchiectasis without complication ([place]) 93/26/7124   HTN (hypertension) 07/04/2022   Genetic testing 05/20/2022   Family history of breast cancer 05/05/2022   Family history of pancreatic cancer 05/05/2022   Family history of prostate cancer 05/05/2022   Elevated blood pressure reading 04/23/2022   Malignant neoplasm of upper-outer quadrant of left breast in female, estrogen receptor positive ([place]) 02/12/2022   History of COVID-19 06/12/2020   Hyperlipidemia 07/10/2015   Hypothyroidism    History  of melanoma    Varicose vein     REFERRING DIAG: left breast cancer at risk for lymphedema  THERAPY DIAG: Aftercare following surgery for neoplasm  PERTINENT HISTORY: Patient was diagnosed on 02/08/22 with left grade 2. It measures less than 1 cm and is located in the upper outer quadrant. It is ER/PR+ (HER2 equivocal - waiting on results) with a Ki67 of 30%. L Lumpectomy and SLNB (0/2) on 03/16/22  PRECAUTIONS: left UE Lymphedema risk, None  SUBJECTIVE: Pt returns for her 3 month L-Dex screen.   PAIN:  Are you having pain? No  SOZO SCREENING: Patient was assessed today using the SOZO machine to determine the lymphedema index score. This was compared to her baseline score. It was determined that she is within the recommended range when compared to her baseline and no further action is needed at this time. She will continue SOZO screenings. These are done every 3 months for 2 years post operatively followed by every 6 months for 2 years, and then annually.   L-DEX FLOWSHEETS - 10/18/22 1500       L-DEX LYMPHEDEMA SCREENING   Measurement Type Unilateral    L-DEX MEASUREMENT EXTREMITY Upper Extremity    POSITION  Standing    DOMINANT SIDE Right    At Risk Side Left    BASELINE SCORE (UNILATERAL) -2.7    L-DEX SCORE (  UNILATERAL) -4.2    VALUE CHANGE (UNILAT) -1.5               Otelia Limes, PTA 10/18/2022, 3:18 PM

## 2022-10-19 ENCOUNTER — Ambulatory Visit
Admission: RE | Admit: 2022-10-19 | Discharge: 2022-10-19 | Disposition: A | Discharge status: Home / Self Care | Payer: Medicare Other | Source: Ambulatory Visit | Attending: Adult Health | Admitting: Adult Health

## 2022-10-19 ENCOUNTER — Other Ambulatory Visit: Payer: Self-pay | Admitting: *Deleted

## 2022-10-19 DIAGNOSIS — J479 Bronchiectasis, uncomplicated: Secondary | ICD-10-CM | POA: Diagnosis not present

## 2022-10-19 DIAGNOSIS — I700 Atherosclerosis of aorta: Secondary | ICD-10-CM | POA: Diagnosis not present

## 2022-10-19 DIAGNOSIS — R918 Other nonspecific abnormal finding of lung field: Secondary | ICD-10-CM | POA: Diagnosis not present

## 2022-10-19 DIAGNOSIS — C50412 Malignant neoplasm of upper-outer quadrant of left female breast: Secondary | ICD-10-CM

## 2022-10-20 ENCOUNTER — Inpatient Hospital Stay: Payer: Medicare Other

## 2022-10-20 ENCOUNTER — Other Ambulatory Visit: Payer: Self-pay

## 2022-10-20 ENCOUNTER — Inpatient Hospital Stay: Payer: Medicare Other | Attending: Hematology and Oncology | Admitting: Hematology and Oncology

## 2022-10-20 ENCOUNTER — Other Ambulatory Visit: Payer: Self-pay | Admitting: *Deleted

## 2022-10-20 DIAGNOSIS — Z170 Estrogen receptor positive status [ER+]: Secondary | ICD-10-CM | POA: Diagnosis not present

## 2022-10-20 DIAGNOSIS — Z79811 Long term (current) use of aromatase inhibitors: Secondary | ICD-10-CM | POA: Insufficient documentation

## 2022-10-20 DIAGNOSIS — Z79899 Other long term (current) drug therapy: Secondary | ICD-10-CM | POA: Insufficient documentation

## 2022-10-20 DIAGNOSIS — Z5112 Encounter for antineoplastic immunotherapy: Secondary | ICD-10-CM | POA: Insufficient documentation

## 2022-10-20 DIAGNOSIS — Z9221 Personal history of antineoplastic chemotherapy: Secondary | ICD-10-CM | POA: Diagnosis not present

## 2022-10-20 DIAGNOSIS — C50412 Malignant neoplasm of upper-outer quadrant of left female breast: Secondary | ICD-10-CM | POA: Diagnosis not present

## 2022-10-20 DIAGNOSIS — Z923 Personal history of irradiation: Secondary | ICD-10-CM | POA: Diagnosis not present

## 2022-10-20 DIAGNOSIS — Z5181 Encounter for therapeutic drug level monitoring: Secondary | ICD-10-CM

## 2022-10-20 LAB — CBC WITH DIFFERENTIAL (CANCER CENTER ONLY)
Abs Immature Granulocytes: 0.01 10*3/uL (ref 0.00–0.07)
Basophils Absolute: 0 10*3/uL (ref 0.0–0.1)
Basophils Relative: 1 %
Eosinophils Absolute: 0.2 10*3/uL (ref 0.0–0.5)
Eosinophils Relative: 4 %
HCT: 37.8 % (ref 36.0–46.0)
Hemoglobin: 13 g/dL (ref 12.0–15.0)
Immature Granulocytes: 0 %
Lymphocytes Relative: 45 %
Lymphs Abs: 2.1 10*3/uL (ref 0.7–4.0)
MCH: 30.2 pg (ref 26.0–34.0)
MCHC: 34.4 g/dL (ref 30.0–36.0)
MCV: 87.7 fL (ref 80.0–100.0)
Monocytes Absolute: 0.5 10*3/uL (ref 0.1–1.0)
Monocytes Relative: 10 %
Neutro Abs: 1.9 10*3/uL (ref 1.7–7.7)
Neutrophils Relative %: 40 %
Platelet Count: 220 10*3/uL (ref 150–400)
RBC: 4.31 MIL/uL (ref 3.87–5.11)
RDW: 13 % (ref 11.5–15.5)
WBC Count: 4.6 10*3/uL (ref 4.0–10.5)
nRBC: 0 % (ref 0.0–0.2)

## 2022-10-20 LAB — CMP (CANCER CENTER ONLY)
ALT: 18 U/L (ref 0–44)
AST: 17 U/L (ref 15–41)
Albumin: 3.7 g/dL (ref 3.5–5.0)
Alkaline Phosphatase: 63 U/L (ref 38–126)
Anion gap: 3 — ABNORMAL LOW (ref 5–15)
BUN: 19 mg/dL (ref 8–23)
CO2: 29 mmol/L (ref 22–32)
Calcium: 9.1 mg/dL (ref 8.9–10.3)
Chloride: 107 mmol/L (ref 98–111)
Creatinine: 0.91 mg/dL (ref 0.44–1.00)
GFR, Estimated: >60 mL/min (ref >60)
Glucose, Bld: 85 mg/dL (ref 70–99)
Potassium: 4 mmol/L (ref 3.5–5.1)
Sodium: 139 mmol/L (ref 135–145)
Total Bilirubin: 0.6 mg/dL (ref 0.3–1.2)
Total Protein: 6.8 g/dL (ref 6.5–8.1)

## 2022-10-20 MED ORDER — TRASTUZUMAB-HYALURONIDASE-OYSK 600-10000 MG-UNT/5ML ~~LOC~~ SOLN
600.0000 mg | Freq: Once | SUBCUTANEOUS | Status: AC
  Administered 2022-10-20: 600 mg via SUBCUTANEOUS
  Filled 2022-10-20: qty 5

## 2022-10-20 NOTE — Assessment & Plan Note (Signed)
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  03/16/2022 Left lumpectomy: Grade 3 IDC 5 mm (6 mm on biopsy), negative for lymphovascular invasion, ER 100%, PR 2%, HER2 FISH positive, margins negative, 0/2 lymph nodes negative 2.  based on only 6 mm tumor size, we discussed the pros and cons of systemic treatments and decided to treat her with Herceptin with letrozole. 3.  Adjuvant radiation completed 05/26/2022 4.  Followed by adjuvant antiestrogen therapy ---------------------------------------------------------------------------- Current treatment: Herceptin started 04/07/2022 with letrozole Herceptin toxicities: Denies any adverse effects   Letrozole toxicities: Denies any hot flashes or arthralgias Right leg medial aspect there appears to be a prominent superficial vein.  It was slightly tender.    Return to clinic every 3 weeks for Herceptin injections every 6 weeks and follow-up with me. 

## 2022-10-20 NOTE — Progress Notes (Signed)
Per MD okay to proceed with tx today with echo from 08/03/22.  Verbal orders received to repeat echo in 1 week.  Appt scheduled, pt notified and verbalized understanding.

## 2022-10-20 NOTE — Patient Instructions (Signed)
[facility]  Discharge Instructions: Thank you for choosing [facility] to provide your oncology and hematology care.   If you have a lab appointment with the [facility], please go directly to the [facility] and check in at the registration area.   Wear comfortable clothing and clothing appropriate for easy access to any Portacath or PICC line.   We strive to give you quality time with your provider. You may need to reschedule your appointment if you arrive late (15 or more minutes).  Arriving late affects you and other patients whose appointments are after yours.  Also, if you miss three or more appointments without notifying the office, you may be dismissed from the clinic at the provider's discretion.      For prescription refill requests, have your pharmacy contact our office and allow 72 hours for refills to be completed.    Today you received the following chemotherapy and/or immunotherapy agents: trastuzumab-hyaluronidase-fihj      To help prevent nausea and vomiting after your treatment, we encourage you to take your nausea medication as directed.  BELOW ARE SYMPTOMS THAT SHOULD BE REPORTED IMMEDIATELY: *FEVER GREATER THAN 100.4 F (38 C) OR HIGHER *CHILLS OR SWEATING *NAUSEA AND VOMITING THAT IS NOT CONTROLLED WITH YOUR NAUSEA MEDICATION *UNUSUAL SHORTNESS OF BREATH *UNUSUAL BRUISING OR BLEEDING *URINARY PROBLEMS (pain or burning when urinating, or frequent urination) *BOWEL PROBLEMS (unusual diarrhea, constipation, pain near the anus) TENDERNESS IN MOUTH AND THROAT WITH OR WITHOUT PRESENCE OF ULCERS (sore throat, sores in mouth, or a toothache) UNUSUAL RASH, SWELLING OR PAIN  UNUSUAL VAGINAL DISCHARGE OR ITCHING   Items with * indicate a potential emergency and should be followed up as soon as possible or go to the Emergency Department if any problems should occur.  Please show the CHEMOTHERAPY ALERT CARD or IMMUNOTHERAPY ALERT  CARD at check-in to the Emergency Department and triage nurse.  Should you have questions after your visit or need to cancel or reschedule your appointment, please contact [facility]  Dept: [phone]  and follow the prompts.  Office hours are 8:00 a.m. to 4:30 p.m. Monday - Friday. Please note that voicemails left after 4:00 p.m. may not be returned until the following business day.  We are closed weekends and major holidays. You have access to a nurse at all times for urgent questions. Please call the main number to the clinic Dept: [phone] and follow the prompts.   For any non-urgent questions, you may also contact your provider using MyChart. We now offer e-Visits for anyone 34 and older to request care online for non-urgent symptoms. For details visit [email or web address].   Also download the MyChart app! Go to the app store, search "MyChart", open the app, select [facility], and log in with your MyChart username and password.

## 2022-10-21 ENCOUNTER — Encounter: Payer: Self-pay | Admitting: Family Medicine

## 2022-10-21 ENCOUNTER — Encounter: Payer: Self-pay | Admitting: *Deleted

## 2022-10-25 ENCOUNTER — Other Ambulatory Visit (HOSPITAL_COMMUNITY): Payer: Medicare Other

## 2022-10-25 NOTE — Progress Notes (Signed)
ATC x1.  LVM to return call. 

## 2022-10-26 ENCOUNTER — Encounter: Payer: Medicare Other | Admitting: Family Medicine

## 2022-10-26 NOTE — Progress Notes (Signed)
Spoke with pt and notified of results per Tammy. Pt verbalized understanding and denied any questions.

## 2022-10-29 ENCOUNTER — Other Ambulatory Visit (HOSPITAL_COMMUNITY): Payer: Medicare Other

## 2022-11-02 ENCOUNTER — Ambulatory Visit (HOSPITAL_BASED_OUTPATIENT_CLINIC_OR_DEPARTMENT_OTHER): Payer: Medicare Other | Admitting: Pulmonary Disease

## 2022-11-02 ENCOUNTER — Ambulatory Visit (INDEPENDENT_AMBULATORY_CARE_PROVIDER_SITE_OTHER): Payer: Medicare Other | Admitting: Pulmonary Disease

## 2022-11-02 DIAGNOSIS — J479 Bronchiectasis, uncomplicated: Secondary | ICD-10-CM | POA: Diagnosis not present

## 2022-11-02 LAB — PULMONARY FUNCTION TEST
DL/VA % pred: 112 %
DL/VA: 4.65 ml/min/mmHg/L
DLCO cor % pred: 96 %
DLCO cor: 19.66 ml/min/mmHg
DLCO unc % pred: 95 %
DLCO unc: 19.41 ml/min/mmHg
FEF 25-75 Post: 1.53 L/sec
FEF 25-75 Pre: 1.4 L/sec
FEF2575-%Change-Post: 9 %
FEF2575-%Pred-Post: 74 %
FEF2575-%Pred-Pre: 68 %
FEV1-%Change-Post: 2 %
FEV1-%Pred-Post: 84 %
FEV1-%Pred-Pre: 82 %
FEV1-Post: 2.05 L
FEV1-Pre: 1.99 L
FEV1FVC-%Change-Post: 0 %
FEV1FVC-%Pred-Pre: 95 %
FEV6-%Change-Post: 2 %
FEV6-%Pred-Post: 90 %
FEV6-%Pred-Pre: 88 %
FEV6-Post: 2.76 L
FEV6-Pre: 2.7 L
FEV6FVC-%Change-Post: 0 %
FEV6FVC-%Pred-Post: 104 %
FEV6FVC-%Pred-Pre: 103 %
FVC-%Change-Post: 1 %
FVC-%Pred-Post: 87 %
FVC-%Pred-Pre: 85 %
FVC-Post: 2.78 L
FVC-Pre: 2.73 L
Post FEV1/FVC ratio: 74 %
Post FEV6/FVC ratio: 100 %
Pre FEV1/FVC ratio: 73 %
Pre FEV6/FVC Ratio: 99 %
RV % pred: 130 %
RV: 2.86 L
TLC % pred: 109 %
TLC: 5.69 L

## 2022-11-02 NOTE — Patient Instructions (Signed)
Full PFT Performed Today  

## 2022-11-02 NOTE — Progress Notes (Signed)
Full PFT Performed Today  

## 2022-11-03 DIAGNOSIS — L570 Actinic keratosis: Secondary | ICD-10-CM | POA: Diagnosis not present

## 2022-11-03 DIAGNOSIS — D2271 Melanocytic nevi of right lower limb, including hip: Secondary | ICD-10-CM | POA: Diagnosis not present

## 2022-11-03 DIAGNOSIS — D485 Neoplasm of uncertain behavior of skin: Secondary | ICD-10-CM | POA: Diagnosis not present

## 2022-11-03 DIAGNOSIS — D225 Melanocytic nevi of trunk: Secondary | ICD-10-CM | POA: Diagnosis not present

## 2022-11-03 DIAGNOSIS — L905 Scar conditions and fibrosis of skin: Secondary | ICD-10-CM | POA: Diagnosis not present

## 2022-11-03 DIAGNOSIS — D2262 Melanocytic nevi of left upper limb, including shoulder: Secondary | ICD-10-CM | POA: Diagnosis not present

## 2022-11-04 ENCOUNTER — Ambulatory Visit (INDEPENDENT_AMBULATORY_CARE_PROVIDER_SITE_OTHER): Payer: Medicare Other

## 2022-11-04 DIAGNOSIS — Z79899 Other long term (current) drug therapy: Secondary | ICD-10-CM

## 2022-11-04 DIAGNOSIS — Z5181 Encounter for therapeutic drug level monitoring: Secondary | ICD-10-CM | POA: Diagnosis not present

## 2022-11-04 LAB — ECHOCARDIOGRAM COMPLETE
Area-P 1/2: 3.72 cm2
MV M vel: 2.04 m/s
MV Peak grad: 16.6 mmHg
S' Lateral: 2.57 cm

## 2022-11-08 ENCOUNTER — Other Ambulatory Visit: Payer: Self-pay

## 2022-11-08 ENCOUNTER — Telehealth: Payer: Self-pay | Admitting: Family Medicine

## 2022-11-08 DIAGNOSIS — R030 Elevated blood-pressure reading, without diagnosis of hypertension: Secondary | ICD-10-CM

## 2022-11-08 MED ORDER — AMLODIPINE BESYLATE 2.5 MG PO TABS: 2.5000 mg | ORAL_TABLET | Freq: Every day | ORAL | 1 refills | Status: DC

## 2022-11-08 NOTE — Telephone Encounter (Signed)
Pt called she needs amLODipine (NORVASC) 2.5 MG tablet filled. Last refill was 04/2022 by Maximiano Coss.

## 2022-11-08 NOTE — Telephone Encounter (Signed)
Sent to the pharmacy for refill until follow up

## 2022-11-09 ENCOUNTER — Other Ambulatory Visit: Payer: Self-pay

## 2022-11-09 ENCOUNTER — Ambulatory Visit: Payer: Medicare Other | Admitting: Adult Health

## 2022-11-09 ENCOUNTER — Encounter: Payer: Self-pay | Admitting: Adult Health

## 2022-11-09 VITALS — BP 128/72 | HR 72 | Temp 98.0°F | Ht 67.0 in | Wt 152.2 lb

## 2022-11-09 DIAGNOSIS — G4733 Obstructive sleep apnea (adult) (pediatric): Secondary | ICD-10-CM | POA: Diagnosis not present

## 2022-11-09 DIAGNOSIS — R030 Elevated blood-pressure reading, without diagnosis of hypertension: Secondary | ICD-10-CM

## 2022-11-09 DIAGNOSIS — J479 Bronchiectasis, uncomplicated: Secondary | ICD-10-CM

## 2022-11-09 MED ORDER — AMLODIPINE BESYLATE 2.5 MG PO TABS: 2.5000 mg | ORAL_TABLET | Freq: Every day | ORAL | 1 refills | Status: DC

## 2022-11-09 NOTE — Telephone Encounter (Signed)
Pt's medication Amlodipine 2.'5Mg'$   was sent to [address] [place], [place] [address] by Noah Delaine.

## 2022-11-09 NOTE — Patient Instructions (Addendum)
Refer to Dr. Toy Cookey for sleep apnea, oral appliance.  Healthy sleep regimen  Do not drive is sleepy    Flutter valve Twice daily   Activity as tolerated.   Follow in 4 months with Dr. Halford Chessman  with Spirometry with DLCO and As needed

## 2022-11-09 NOTE — Assessment & Plan Note (Signed)
High-resolution CT chest shows biapical pleural-parenchymal scarring, bronchiectasis and nodularity in the right middle lobe and lingula associated with mild volume loss.  This was negative for interstitial lung disease.  Concerning for possible chronic MAI.  We discussed her CT results in detail.  Patient is completely asymptomatic.  She has no cough or congestion.  Is very active with no shortness of breath. Pulmonary function testing is normal with no airflow obstruction or restriction.  Diffusing capacity is completely normal.  She is undergoing active treatment for breast cancer with Herceptin .  No evidence of pneumonitis or ILD on CT scan.  Will continue to watch closely.  Would have to undergo bronchoscopy with BAL to check for Mycobacterium and atypical infections.  Will have her return in 4 months with spirometry and DLCO.  Advise if she develops any symptoms such cough congestion frequent infections she is to contact us for sooner follow-up. Could consider immunoglobulin testing on return.   Plan  Patient Instructions  Refer to Dr. Toy Cookey for sleep apnea, oral appliance.  Healthy sleep regimen  Do not drive is sleepy    Flutter valve Twice daily   Activity as tolerated.   Follow in 4 months with Dr. Halford Chessman  with Spirometry with DLCO and As needed

## 2022-11-09 NOTE — Assessment & Plan Note (Signed)
Mild obstructive sleep apnea.  Patient education was given on sleep study results.  She would like to be considered for oral appliance.  Referral to Dr. Toy Cookey orthodontics.   Plan  Patient Instructions  Refer to Dr. Toy Cookey for sleep apnea, oral appliance.  Healthy sleep regimen  Do not drive is sleepy    Flutter valve Twice daily   Activity as tolerated.   Follow in 4 months with Dr. Halford Chessman  with Spirometry with DLCO and As needed

## 2022-11-09 NOTE — Progress Notes (Signed)
@Patient  ID: Julie Pugh, female    DOB: Jun 10, 1956, 67 y.o.   MRN: [id number]  No chief complaint on file.   Referring provider: [name], MD  HPI: 67 year old female seen for consult August 13, 2022 for daytime sleepiness snoring and restless sleep along with abnormal CT chest with bronchiectatic changes Medical history significant for breast cancer diagnosed in Feb 12, 2022 status post lumpectomy (03/2022)  and radiation (completed 05/2022) . On  letrozole and herceptin (started 04/07/22)   TEST/EVENTS :  2D echo July 06, 2022 EF at 60-65%,, normal pulmonary artery systolic pressure   40/9811 Coronary CT chest no evidence of lymphadenopathy in the visualized mediastinum/hilar area, tree-in-bud nodularity in upper lobes and left lower lobe associated with bronchiectasis and bronchial wall thickening in the upper lobes, right middle lobe and anterior lower lobes, scattered areas of small airway impaction.  High-resolution CT chest October 19, 2022 biapical pleural-parenchymal scarring, bronchiectasis and nodularity in the right middle lobe and lingula associated with mild volume loss.  Negative for ILD.   Home sleep study October 14, 2022 shows mild sleep apnea with AHI at 10.3/hour and SpO2 low at 81%.   11/09/2022 Follow up : OSA and Bronchiectasis  Patient presents for a 33-monthfollow-up.  Patient was seen last visit for a consult.  She had complained of snoring, daytime sleepiness frequent headaches and restless sleep.  She was set up for a home sleep study that was completed on October 14, 2022 that showed mild sleep apnea with AHI 10.3/hour and SpO2 low at 81%.  We reviewed her sleep study results in detail.  Went over treatment options including oral appliance and CPAP.  Patient would like to proceed with oral appliance.  Patient education was given.  As above patient was diagnosed with breast cancer last year.  She underwent a lumpectomy and radiation therapy.  She  is currently on letrozole and herceptin.  Patient is followed by oncology.  She is getting routine serial echoes.  Most recent 2D echo November 04, 2022 showed EF at 60-65%, mild LVH, grade 1 diastolic dysfunction, normal pulmonary artery pressure.  Patient did undergo a coronary CTA and October 2023.  Incidental finding of tree-in-bud nodularity of the upper lobes and left lower lobe bronchiectasis was noted.  Patient was set up for a dedicated CT chest high-res that was completed on October 19, 2022.  This showed biapical pleural-parenchymal scarring, bronchiectasis and nodularity in the right middle lobe and lingula associated with volume loss.  Negative for ILD.  Patient has no symptoms.  She denies any cough, shortness of breath.  She is extremely active exercises every day and plays competitive tennis and pickleball.  She has no unintentional weight loss.  Denies hemoptysis.  We reviewed her CT scan results in detail.  Patient was set up for pulmonary function testing that was normal with no airflow obstruction or restriction.  Diffusing capacity was also normal.  Patient denies any chronic sinus drainage or chronic allergies.  Denies any reflux.  Patient was started on flutter valve last visit to use if she develops any cough or congestion. Does report as young child she had several episodes of bronchitis and whooping cough.   She is set to finish her herceptin infusions hopefully in June.  Says that she has been tolerating very well.  No Known Allergies   There is no immunization history on file for this patient.  Past Medical History:  Diagnosis Date   Family history of  breast cancer    Family history of pancreatic cancer    Family history of prostate cancer    Hx of melanoma of skin    Hyperlipidemia    Hypothyroidism    Dr. Jaynie Collins managing. armour thyroid 84m    [place]. sees derm q6 months   Varicose vein     Tobacco History: Social History   Tobacco Use   Smoking Status Never   Passive exposure: Past  Smokeless Tobacco Never   Counseling given: Not Answered   Outpatient Medications Prior to Visit  Medication Sig Dispense Refill   amLODipine (NORVASC) 2.5 MG tablet Take 1 tablet (2.5 mg total) by mouth daily. 90 tablet 1   Bioflavonoid Products (ESTER-C) 500-200-60 MG TABS Take by mouth.     Cholecalciferol (VITAMIN D) 2000 units tablet Take 5,000 Units by mouth daily.     diphenhydramine-acetaminophen (TYLENOL PM) 25-500 MG TABS tablet Take 1 tablet by mouth at bedtime as needed.     letrozole (FEMARA) 2.5 MG tablet Take 1 tablet (2.5 mg total) by mouth daily. 90 tablet 3   levothyroxine (SYNTHROID) 100 MCG tablet TAKE 1 TABLET(100 MCG) BY MOUTH DAILY 90 tablet 3   METRONIDAZOLE, TOPICAL, 0.75 % LOTN      No facility-administered medications prior to visit.     Review of Systems:   Constitutional:   No  weight loss, night sweats,  Fevers, chills, fatigue, or  lassitude.  HEENT:   No headaches,  Difficulty swallowing,  Tooth/dental problems, or  Sore throat,                No sneezing, itching, ear ache, nasal congestion, post nasal drip,   CV:  No chest pain,  Orthopnea, PND, swelling in lower extremities, anasarca, dizziness, palpitations, syncope.   GI  No heartburn, indigestion, abdominal pain, nausea, vomiting, diarrhea, change in bowel habits, loss of appetite, bloody stools.   Resp: No shortness of breath with exertion or at rest.  No excess mucus, no productive cough,  No non-productive cough,  No coughing up of blood.  No change in color of mucus.  No wheezing.  No chest wall deformity  Skin: no rash or lesions.  GU: no dysuria, change in color of urine, no urgency or frequency.  No flank pain, no hematuria   MS:  No joint pain or swelling.  No decreased range of motion.  No back pain.    Physical Exam  BP 128/72 (BP Location: Right Arm, Patient Position: Sitting, Cuff Size: Normal)   Pulse 72   Temp 98 F (36.7  C) (Oral)   Ht 5' 7"  (1.702 m)   Wt 152 lb 3.2 oz (69 kg)   SpO2 98% Comment: RA  BMI 23.84 kg/m   GEN: A/Ox3; pleasant , NAD, well nourished    HEENT:  [place]/AT,  EACs-clear, TMs-wnl, NOSE-clear, THROAT-clear, no lesions, no postnasal drip or exudate noted.   NECK:  Supple w/ fair ROM; no JVD; normal carotid impulses w/o bruits; no thyromegaly or nodules palpated; no lymphadenopathy.    RESP  Clear  P & A; w/o, wheezes/ rales/ or rhonchi. no accessory muscle use, no dullness to percussion  CARD:  RRR, no m/r/g, no peripheral edema, pulses intact, no cyanosis or clubbing.  GI:   Soft & nt; nml bowel sounds; no organomegaly or masses detected.   Musco: Warm bil, no deformities or joint swelling noted.   Neuro: alert, no focal deficits noted.  Skin: Warm, no lesions or rashes    Lab Results:  CBC    Component Value Date/Time   WBC 4.6 10/20/2022 0901   WBC 5.9 10/22/2021 1321   RBC 4.31 10/20/2022 0901   HGB 13.0 10/20/2022 0901   HCT 37.8 10/20/2022 0901   PLT 220 10/20/2022 0901   MCV 87.7 10/20/2022 0901   MCH 30.2 10/20/2022 0901   MCHC 34.4 10/20/2022 0901   RDW 13.0 10/20/2022 0901   LYMPHSABS 2.1 10/20/2022 0901   MONOABS 0.5 10/20/2022 0901   EOSABS 0.2 10/20/2022 0901   BASOSABS 0.0 10/20/2022 0901    BMET    Component Value Date/Time   NA 139 10/20/2022 0901   NA 139 06/28/2014 0000   K 4.0 10/20/2022 0901   CL 107 10/20/2022 0901   CO2 29 10/20/2022 0901   GLUCOSE 85 10/20/2022 0901   BUN 19 10/20/2022 0901   BUN 20 06/28/2014 0000   CREATININE 0.91 10/20/2022 0901   CALCIUM 9.1 10/20/2022 0901   GFRNONAA >60 10/20/2022 0901       BNP No results found for: "BNP"  ProBNP No results found for: "PROBNP"  Imaging: ECHOCARDIOGRAM COMPLETE  Result Date: 11/04/2022    ECHOCARDIOGRAM REPORT   Patient Name:   Julie Pugh Date of Exam: 11/04/2022 Medical Rec #:  [id number]        Height:       67.0 in Accession #:    [id number]        Weight:       153.8 lb Date of Birth:  Jan 09, 1956        BSA:          1.809 m Patient Age:    38 years         BP:           110/70 mmHg Patient Gender: F                HR:           67 bpm. Exam Location:  Outpatient Procedure: 2D Echo, 3D Echo, Cardiac Doppler, Color Doppler and Strain Analysis Indications:    Encounter for monitoring cardiotoxic drug therapy [Z51.81  History:        Patient has prior history of Echocardiogram examinations, most                 recent 07/06/2022. Risk Factors:Hypertension and Non-Smoker.                 Malignant neoplasm of upper-outer quadrant of left breast in                 female.  Sonographer:    Leavy Cella RDCS Referring Phys: [phone] Nicholas Lose IMPRESSIONS  1. Left ventricular ejection fraction, by estimation, is 60 to 65%. The left ventricle has normal function. The left ventricle has no regional wall motion abnormalities. There is mild left ventricular hypertrophy. Left ventricular diastolic parameters are consistent with Grade I diastolic dysfunction (impaired relaxation).  2. Right ventricular systolic function is normal. The right ventricular size is normal. There is normal pulmonary artery systolic pressure. The estimated right ventricular systolic pressure is 99.2 mmHg.  3. The mitral valve is normal in structure. Mild mitral valve regurgitation. No evidence of mitral stenosis.  4. The aortic valve is normal in structure. Aortic valve regurgitation is not visualized. No aortic stenosis is present.  5. The inferior vena cava is normal in size with greater than 50%  respiratory variability, suggesting right atrial pressure of 3 mmHg. Comparison(s): No significant change from prior study. Prior images reviewed side by side. FINDINGS  Left Ventricle: Left ventricular ejection fraction, by estimation, is 60 to 65%. The left ventricle has normal function. The left ventricle has no regional wall motion abnormalities. 3D left ventricular ejection fraction analysis  performed but not reported based on interpreter judgement due to suboptimal tracking. The left ventricular internal cavity size was normal in size. There is mild left ventricular hypertrophy. Left ventricular diastolic parameters are consistent with Grade I diastolic dysfunction (impaired relaxation). Right Ventricle: The right ventricular size is normal. No increase in right ventricular wall thickness. Right ventricular systolic function is normal. There is normal pulmonary artery systolic pressure. The tricuspid regurgitant velocity is 2.58 m/s, and  with an assumed right atrial pressure of 3 mmHg, the estimated right ventricular systolic pressure is 54.6 mmHg. Left Atrium: Left atrial size was normal in size. Right Atrium: Right atrial size was normal in size. Pericardium: There is no evidence of pericardial effusion. Mitral Valve: The mitral valve is normal in structure. Mild mitral valve regurgitation. No evidence of mitral valve stenosis. Tricuspid Valve: The tricuspid valve is normal in structure. Tricuspid valve regurgitation is mild . No evidence of tricuspid stenosis. Aortic Valve: The aortic valve is normal in structure. Aortic valve regurgitation is not visualized. No aortic stenosis is present. Pulmonic Valve: The pulmonic valve was normal in structure. Pulmonic valve regurgitation is not visualized. No evidence of pulmonic stenosis. Aorta: The aortic root is normal in size and structure. Venous: The inferior vena cava is normal in size with greater than 50% respiratory variability, suggesting right atrial pressure of 3 mmHg. IAS/Shunts: No atrial level shunt detected by color flow Doppler.  LEFT VENTRICLE PLAX 2D LVIDd:         4.45 cm   Diastology LVIDs:         2.57 cm   LV e' medial:    8.38 cm/s LV PW:         1.25 cm   LV E/e' medial:  9.9 LV IVS:        1.04 cm   LV e' lateral:   9.25 cm/s LVOT diam:     2.10 cm   LV E/e' lateral: 9.0 LV SV:         67 LV SV Index:   37 LVOT Area:     3.46 cm                            3D Volume EF:                          3D EF:        77 %                          LV EDV:       187 ml                          LV ESV:       42 ml                          LV SV:        145 ml RIGHT VENTRICLE RV Basal diam:  4.20 cm RV  Mid diam:    3.15 cm RV S prime:     11.10 cm/s TAPSE (M-mode): 2.7 cm LEFT ATRIUM             Index        RIGHT ATRIUM           Index LA diam:        3.60 cm 1.99 cm/m   RA Area:     15.10 cm LA Vol (A2C):   44.5 ml 24.60 ml/m  RA Volume:   40.40 ml  22.34 ml/m LA Vol (A4C):   56.4 ml 31.18 ml/m LA Biplane Vol: 49.3 ml 27.26 ml/m  AORTIC VALVE LVOT Vmax:   99.20 cm/s LVOT Vmean:  62.200 cm/s LVOT VTI:    0.192 m  AORTA Ao Root diam: 3.10 cm Ao Asc diam:  3.00 cm MITRAL VALVE               TRICUSPID VALVE MV Area (PHT): 3.72 cm    TR Peak grad:   26.6 mmHg MV Decel Time: 204 msec    TR Vmax:        258.00 cm/s MR Peak grad: 16.6 mmHg MR Vmax:      204.00 cm/s  SHUNTS MV E velocity: 83.00 cm/s  Systemic VTI:  0.19 m MV A velocity: 83.60 cm/s  Systemic Diam: 2.10 cm MV E/A ratio:  0.99 Candee Furbish MD Electronically signed by Candee Furbish MD Signature Date/Time: 11/04/2022/3:43:25 PM    Final    CT Chest High Resolution  Result Date: 10/20/2022 CLINICAL DATA:  Bronchiectasis, stage I breast cancer with ongoing injection every 3 weeks. * Tracking Code: BO * EXAM: CT CHEST WITHOUT CONTRAST TECHNIQUE: Multidetector CT imaging of the chest was performed following the standard protocol without intravenous contrast. High resolution imaging of the lungs, as well as inspiratory and expiratory imaging, was performed. RADIATION DOSE REDUCTION: This exam was performed according to the departmental dose-optimization program which includes automated exposure control, adjustment of the mA and/or kV according to patient size and/or use of iterative reconstruction technique. COMPARISON:  Cardiac CT 08/02/2022. FINDINGS: Cardiovascular: Atherosclerotic calcification of  the aorta. Heart size normal. No pericardial effusion. Mediastinum/Nodes: No pathologically enlarged mediastinal or axillary lymph nodes. Hilar regions are difficult to definitively evaluate without IV contrast. Postoperative scarring in the left breast and left axilla with skin thickening in the left breast. Air is seen in the esophagus which can be seen with dysmotility. Lungs/Pleura: Biapical pleuroparenchymal scarring. Cylindrical bronchiectasis and peribronchovascular nodularity predominate in the right middle lobe and lingula, with associated mild volume loss. Negative for subpleural reticulation, traction bronchiectasis/bronchiolectasis, ground-glass, architectural distortion or honeycombing. No pleural fluid. Airway is otherwise unremarkable. There is air trapping. Upper Abdomen: Visualized portions of the liver, gallbladder, adrenal glands and right kidney are unremarkable. Probable renal sinus cysts on the left. No specific follow-up necessary. Visualized portions of the spleen, pancreas, stomach and bowel are grossly unremarkable. No upper abdominal adenopathy. Musculoskeletal: Degenerative changes in the spine. No worrisome lytic or sclerotic lesions. IMPRESSION: 1. 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 2. No evidence of fibrotic interstitial lung disease. 3. 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 4. No evidence of metastatic breast cancer. 5.  Aortic atherosclerosis (ICD10-I70.0). Electronically Signed   By: Lorin Picket M.D.   On: 10/20/2022 12:45    trastuzumab-hyaluronidase-oysk (HERCEPTIN HYLECTA) 600-10000 MG-UNT/5ML chemo SQ injection 600  mg     Date Action Dose Route User   09/29/2022 1459 Given 600 mg Subcutaneous (Right Anterior Thigh) Tildon Husky, RN      trastuzumab-hyaluronidase-oysk (HERCEPTIN HYLECTA) 600-10000  MG-UNT/5ML chemo SQ injection 600 mg     Date Action Dose Route User   10/20/2022 1047 Given 600 mg Subcutaneous (Left Anterior Thigh) Clyda Hurdle, RN          Latest Ref Rng & Units 11/02/2022    2:19 PM  PFT Results  FVC-Pre L 2.73   FVC-Predicted Pre % 85   FVC-Post L 2.78   FVC-Predicted Post % 87   Pre FEV1/FVC % % 73   Post FEV1/FCV % % 74   FEV1-Pre L 1.99   FEV1-Predicted Pre % 82   FEV1-Post L 2.05   DLCO uncorrected ml/min/mmHg 19.41   DLCO UNC% % 95   DLCO corrected ml/min/mmHg 19.66   DLCO COR %Predicted % 96   DLVA Predicted % 112   TLC L 5.69   TLC % Predicted % 109   RV % Predicted % 130     No results found for: "NITRICOXIDE"      Assessment & Plan:   Bronchiectasis without complication (HCC) High-resolution CT chest shows biapical pleural-parenchymal scarring, bronchiectasis and nodularity in the right middle lobe and lingula associated with mild volume loss.  This was negative for interstitial lung disease.  Concerning for possible chronic MAI.  We discussed her CT results in detail.  Patient is completely asymptomatic.  She has no cough or congestion.  Is very active with no shortness of breath. Pulmonary function testing is normal with no airflow obstruction or restriction.  Diffusing capacity is completely normal.  She is undergoing active treatment for breast cancer with Herceptin .  No evidence of pneumonitis or ILD on CT scan.  Will continue to watch closely.  Would have to undergo bronchoscopy with BAL to check for Mycobacterium and atypical infections.  Will have her return in 4 months with spirometry and DLCO.  Advise if she develops any symptoms such cough congestion frequent infections she is to contact us for sooner follow-up. Could consider immunoglobulin testing on return.   Plan  Patient Instructions  Refer to Dr. Toy Cookey for sleep apnea, oral appliance.  Healthy sleep regimen  Do not drive is sleepy    Flutter valve Twice daily    Activity as tolerated.   Follow in 4 months with Dr. Halford Chessman  with Spirometry with DLCO and As needed      OSA (obstructive sleep apnea) Mild obstructive sleep apnea.  Patient education was given on sleep study results.  She would like to be considered for oral appliance.  Referral to Dr. Toy Cookey orthodontics.   Plan  Patient Instructions  Refer to Dr. Toy Cookey for sleep apnea, oral appliance.  Healthy sleep regimen  Do not drive is sleepy    Flutter valve Twice daily   Activity as tolerated.   Follow in 4 months with Dr. Halford Chessman  with Spirometry with DLCO and As needed       I spent   42 minutes dedicated to the care of this patient on the date of this encounter to include pre-visit review of records, face-to-face time with the patient discussing conditions above, post visit ordering of testing, clinical documentation with the electronic health record, making appropriate referrals as documented, and communicating necessary findings to members of the patients care team.   Rexene Edison, NP 11/09/2022

## 2022-11-10 ENCOUNTER — Other Ambulatory Visit: Payer: Self-pay

## 2022-11-10 ENCOUNTER — Inpatient Hospital Stay: Payer: Medicare Other

## 2022-11-10 VITALS — BP 148/74 | HR 70 | Temp 98.6°F | Resp 18 | Wt 151.0 lb

## 2022-11-10 DIAGNOSIS — Z923 Personal history of irradiation: Secondary | ICD-10-CM | POA: Diagnosis not present

## 2022-11-10 DIAGNOSIS — Z79811 Long term (current) use of aromatase inhibitors: Secondary | ICD-10-CM | POA: Diagnosis not present

## 2022-11-10 DIAGNOSIS — Z170 Estrogen receptor positive status [ER+]: Secondary | ICD-10-CM | POA: Diagnosis not present

## 2022-11-10 DIAGNOSIS — Z79899 Other long term (current) drug therapy: Secondary | ICD-10-CM | POA: Diagnosis not present

## 2022-11-10 DIAGNOSIS — C50412 Malignant neoplasm of upper-outer quadrant of left female breast: Secondary | ICD-10-CM | POA: Diagnosis not present

## 2022-11-10 DIAGNOSIS — Z5112 Encounter for antineoplastic immunotherapy: Secondary | ICD-10-CM | POA: Diagnosis not present

## 2022-11-10 DIAGNOSIS — Z9221 Personal history of antineoplastic chemotherapy: Secondary | ICD-10-CM | POA: Diagnosis not present

## 2022-11-10 MED ORDER — TRASTUZUMAB-HYALURONIDASE-OYSK 600-10000 MG-UNT/5ML ~~LOC~~ SOLN
600.0000 mg | Freq: Once | SUBCUTANEOUS | Status: AC
  Administered 2022-11-10: 600 mg via SUBCUTANEOUS
  Filled 2022-11-10: qty 5

## 2022-11-10 NOTE — Progress Notes (Signed)
Reviewed and agree with assessment/plan.   Chesley Mires, MD [facility] Pulmonary/Critical Care 11/10/2022, 9:20 AM Pager:  [phone]

## 2022-11-10 NOTE — Patient Instructions (Signed)
[facility]  Discharge Instructions: Thank you for choosing [facility] to provide your oncology and hematology care.   If you have a lab appointment with the Cancer Center, please go directly to the Cancer Center and check in at the registration area.   Wear comfortable clothing and clothing appropriate for easy access to any Portacath or PICC line.   We strive to give you quality time with your provider. You may need to reschedule your appointment if you arrive late (15 or more minutes).  Arriving late affects you and other patients whose appointments are after yours.  Also, if you miss three or more appointments without notifying the office, you may be dismissed from the clinic at the provider's discretion.      For prescription refill requests, have your pharmacy contact our office and allow 72 hours for refills to be completed.    Today you received the following chemotherapy and/or immunotherapy agents herceptin hylecta      To help prevent nausea and vomiting after your treatment, we encourage you to take your nausea medication as directed.  BELOW ARE SYMPTOMS THAT SHOULD BE REPORTED IMMEDIATELY: *FEVER GREATER THAN 100.4 F (38 C) OR HIGHER *CHILLS OR SWEATING *NAUSEA AND VOMITING THAT IS NOT CONTROLLED WITH YOUR NAUSEA MEDICATION *UNUSUAL SHORTNESS OF BREATH *UNUSUAL BRUISING OR BLEEDING *URINARY PROBLEMS (pain or burning when urinating, or frequent urination) *BOWEL PROBLEMS (unusual diarrhea, constipation, pain near the anus) TENDERNESS IN MOUTH AND THROAT WITH OR WITHOUT PRESENCE OF ULCERS (sore throat, sores in mouth, or a toothache) UNUSUAL RASH, SWELLING OR PAIN  UNUSUAL VAGINAL DISCHARGE OR ITCHING   Items with * indicate a potential emergency and should be followed up as soon as possible or go to the Emergency Department if any problems should occur.  Please show the CHEMOTHERAPY ALERT CARD or IMMUNOTHERAPY ALERT CARD  at check-in to the Emergency Department and triage nurse.  Should you have questions after your visit or need to cancel or reschedule your appointment, please contact [facility]  Dept: [phone]  and follow the prompts.  Office hours are 8:00 a.m. to 4:30 p.m. Monday - Friday. Please note that voicemails left after 4:00 p.m. may not be returned until the following business day.  We are closed weekends and major holidays. You have access to a nurse at all times for urgent questions. Please call the main number to the clinic Dept: [phone] and follow the prompts.   For any non-urgent questions, you may also contact your provider using MyChart. We now offer e-Visits for anyone 18 and older to request care online for non-urgent symptoms. For details visit [email or web address].   Also download the MyChart app! Go to the app store, search "MyChart", open the app, select , and log in with your MyChart username and password.   

## 2022-11-23 ENCOUNTER — Ambulatory Visit (INDEPENDENT_AMBULATORY_CARE_PROVIDER_SITE_OTHER): Payer: Medicare Other | Admitting: Pulmonary Disease

## 2022-11-23 ENCOUNTER — Encounter (HOSPITAL_BASED_OUTPATIENT_CLINIC_OR_DEPARTMENT_OTHER): Payer: Self-pay | Admitting: Pulmonary Disease

## 2022-11-23 VITALS — BP 126/70 | HR 63 | Wt 151.0 lb

## 2022-11-23 DIAGNOSIS — J479 Bronchiectasis, uncomplicated: Secondary | ICD-10-CM | POA: Diagnosis not present

## 2022-11-23 DIAGNOSIS — G4733 Obstructive sleep apnea (adult) (pediatric): Secondary | ICD-10-CM

## 2022-11-23 NOTE — Progress Notes (Signed)
[facility]  Chief Complaint  Patient presents with   Follow-up    Pt is here for follow up for OSA. Pt states that she has an appt with a doc about her oral appliance next week.     Past Surgical History:  She  has a past surgical history that includes Varicose vein surgery; Breast biopsy; Breast biopsy (Right, 03/12/2004); and Breast lumpectomy with radioactive seed and sentinel lymph node biopsy (Left, 03/16/2022).  Past Medical History:  Melanoma, HLD, Hypothyroidism, Varicose veins, Breast cancer May 2023  Constitutional:  BP 126/70 (BP Location: Left Arm, Patient Position: Sitting, Cuff Size: Normal)   Pulse 63   Wt 151 lb (68.5 kg)   SpO2 98%   BMI 23.65 kg/m   Brief Summary:  Julie Pugh is a 67 y.o. female with obstructive sleep apnea and bronchiectasis.      Subjective:   She was seen most recently by [facility].    She had COVID last year.  She was diagnosed with breast cancer.  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  PFT normal.  No respiratory symptoms.  She is active and plans tennis on regular basis.  Sleep study showed mild sleep apnea.  She has follow up with Dr. Toy Cookey later this month to get an oral appliance.  Physical Exam:   Appearance - well kempt   ENMT - no sinus tenderness, no oral exudate, no LAN, Mallampati 4 airway, no stridor, over-bite  Respiratory - equal breath sounds bilaterally, no wheezing or rales  CV - s1s2 regular rate and rhythm, no murmurs  Ext - no clubbing, no edema  Skin - no rashes  Psych - normal mood and affect   Pulmonary testing:  PFT 11/02/22 >> FEV1 2.05 (84%), FEV1% 74, TLC 5.69 (109%), DLCO 95%  Chest Imaging:  HRCT chest 10/20/22 >> atherosclerosis, apical scarring, cylindrical BTX in RML and lingula, air trapping  Sleep Tests:  HST 10/14/22 >> AHI 10.3, SpO2 low 81%  Cardiac Tests:  Echo 11/04/22 >> EF 60 to 65%, mild LVH, grade 1 DD, RVSP 29.6  mmHg, mild MR  Social History:  She  reports that she has never smoked. She has been exposed to tobacco smoke. She has never used smokeless tobacco. She reports current alcohol use of about 4.0 standard drinks of alcohol per week. She reports that she does not use drugs.  Family History:  Her family history includes Breast cancer in her maternal aunt; COPD in her mother; Congestive Heart Failure in her mother; Heart attack in her father; Hyperlipidemia in her sister; Hypertension in her mother; Pancreatic cancer (age of onset: 72) in her cousin; Panic disorder in her mother; Prostate cancer (age of onset: 73) in her father.     Assessment/Plan:   Obstructive sleep apnea. - reviewed her sleep study - discussed how sleep apnea can impact her health - treatment options discussed - she will follow up with Dr. Augustina Mood to get an oral appliance - she will need a follow up home sleep study once she is established on therapy with the oral appliance  Regional bronchiectasis. - likely related to prior respiratory infection - no symptoms at present - continue clinical observation - discussed symptoms/signs to monitor for that would indicate the need for additional assessment and therapy  Time Spent Involved in Patient Care on Day of Examination:  27 minutes  Follow up:   Patient Instructions  We will cancel the breathing  test schedule in 4 months and change your follow up appointment to 6 months  Medication List:   Allergies as of 11/23/2022   No Known Allergies      Medication List        Accurate as of November 23, 2022  1:28 PM. If you have any questions, ask your nurse or doctor.          amLODipine 2.5 MG tablet Commonly known as: NORVASC Take 1 tablet (2.5 mg total) by mouth daily.   diphenhydramine-acetaminophen 25-500 MG Tabs tablet Commonly known as: TYLENOL PM Take 1 tablet by mouth at bedtime as needed.   Ester-C 500-200-60 MG Tabs Take by mouth.    letrozole 2.5 MG tablet Commonly known as: FEMARA Take 1 tablet (2.5 mg total) by mouth daily.   levothyroxine 100 MCG tablet Commonly known as: SYNTHROID TAKE 1 TABLET(100 MCG) BY MOUTH DAILY   METRONIDAZOLE (TOPICAL) 0.75 % Lotn   Vitamin D 50 MCG (2000 UT) tablet Take 5,000 Units by mouth daily.        Signature:  Chesley Mires, MD [place] Pager - [phone] 11/23/2022, 1:28 PM

## 2022-11-23 NOTE — Patient Instructions (Signed)
We will cancel the breathing test schedule in 4 months and change your follow up appointment to 6 months

## 2022-11-29 NOTE — Progress Notes (Signed)
Patient Care Team: [name], MD as PCP - General (Family Medicine) Philemon Kingdom, MD as Consulting Physician (Internal Medicine) Nicholas Lose, MD as Medical Oncologist (Hematology and Oncology)  DIAGNOSIS: No diagnosis found.  SUMMARY OF ONCOLOGIC HISTORY: Oncology History  Malignant neoplasm of upper-outer quadrant of left breast in female, estrogen receptor positive ([place])  02/12/2022 Initial Diagnosis   Screening mammogram detected left breast mass, by ultrasound measured 0.6 cm, biopsy revealed grade 2 IDC ER 100%, PR 2%, HER2 equivocal by IHC and FISH positive, Ki-67 30%   02/17/2022 Cancer Staging   Staging form: Breast, AJCC 8th Edition - Clinical: Stage IA (cT1b, cN0, cM0, G2, ER+, PR-, HER2+) - Signed by Nicholas Lose, MD on 02/17/2022 Stage prefix: Initial diagnosis Histologic grading system: 3 grade system   03/16/2022 Surgery   Left lumpectomy: Grade 3 IDC 5 mm, negative for lymphovascular invasion, ER 100%, PR 2%, HER2 FISH positive, margins negative, 0/2 lymph nodes negative   03/29/2022 -  Anti-estrogen oral therapy   Letrozole daily   04/07/2022 - 05/19/2022 Chemotherapy   Patient is on Treatment Plan : BREAST Trastuzumab q21d x 13 cycles     04/07/2022 -  Chemotherapy   Patient is on Treatment Plan : BREAST Trastuzumab IV (8/6) or SQ (600) D1 q21d     04/29/2022 - 05/26/2022 Radiation Therapy   Adjuvant Radiation therapy   05/05/2022 Genetic Testing   Referral pending   05/09/2022 Cancer Staging   Staging form: Breast, AJCC 8th Edition - Pathologic: Stage IA (pT1a, pN0, cM0, G3, ER+, PR-, HER2+) - Signed by Gardenia Phlegm, NP on 05/09/2022 Histologic grading system: 3 grade system   05/13/2022 Genetic Testing   Negative genetic testing on the CancerNext-Expanded+RNAinsight panel.  The report date is May 13, 2022.  The CancerNext-Expanded gene panel offered by [facility] and includes sequencing and rearrangement analysis for the following  77 genes: AIP, ALK, APC*, ATM*, AXIN2, BAP1, BARD1, BLM, BMPR1A, BRCA1*, BRCA2*, BRIP1*, CDC73, CDH1*, CDK4, CDKN1B, CDKN2A, CHEK2*, CTNNA1, DICER1, FANCC, FH, FLCN, GALNT12, KIF1B, LZTR1, MAX, MEN1, MET, MLH1*, MSH2*, MSH3, MSH6*, MUTYH*, NBN, NF1*, NF2, NTHL1, PALB2*, PHOX2B, PMS2*, POT1, PRKAR1A, PTCH1, PTEN*, RAD51C*, RAD51D*, RB1, RECQL, RET, SDHA, SDHAF2, SDHB, SDHC, SDHD, SMAD4, SMARCA4, SMARCB1, SMARCE1, STK11, SUFU, TMEM127, TP53*, TSC1, TSC2, VHL and XRCC2 (sequencing and deletion/duplication); EGFR, EGLN1, HOXB13, KIT, MITF, PDGFRA, POLD1, and POLE (sequencing only); EPCAM and GREM1 (deletion/duplication only). DNA and RNA analyses performed for * genes.      CHIEF COMPLIANT: Follow-up left lumpectomy on herceptin and letrozole     INTERVAL HISTORY: Julie Pugh is a 67 y.o with the above mention left lumpectomy on herceptin and letrozole. She presents to the clinic today for a follow-up.     ALLERGIES:  has No Known Allergies.  MEDICATIONS:  Current Outpatient Medications  Medication Sig Dispense Refill   amLODipine (NORVASC) 2.5 MG tablet Take 1 tablet (2.5 mg total) by mouth daily. 90 tablet 1   Bioflavonoid Products (ESTER-C) 500-200-60 MG TABS Take by mouth.     Cholecalciferol (VITAMIN D) 2000 units tablet Take 5,000 Units by mouth daily.     diphenhydramine-acetaminophen (TYLENOL PM) 25-500 MG TABS tablet Take 1 tablet by mouth at bedtime as needed.     letrozole (FEMARA) 2.5 MG tablet Take 1 tablet (2.5 mg total) by mouth daily. 90 tablet 3   levothyroxine (SYNTHROID) 100 MCG tablet TAKE 1 TABLET(100 MCG) BY MOUTH DAILY 90 tablet 3   METRONIDAZOLE, TOPICAL, 0.75 % LOTN  No current facility-administered medications for this visit.    PHYSICAL EXAMINATION: ECOG PERFORMANCE STATUS: {CHL ONC ECOG PS:[id number]}  There were no vitals filed for this visit. There were no vitals filed for this visit.  BREAST:*** No palpable masses or nodules in either right or left  breasts. No palpable axillary supraclavicular or infraclavicular adenopathy no breast tenderness or nipple discharge. (exam performed in the presence of a chaperone)  LABORATORY DATA:  I have reviewed the data as listed    Latest Ref Rng & Units 10/20/2022    9:01 AM 10/22/2021    1:21 PM 10/08/2020    9:07 AM  CMP  Glucose 70 - 99 mg/dL 85  87  89   BUN 8 - 23 mg/dL 19  20  18   $ Creatinine 0.44 - 1.00 mg/dL 0.91  0.95  0.87   Sodium 135 - 145 mmol/L 139  140  140   Potassium 3.5 - 5.1 mmol/L 4.0  4.2  4.4   Chloride 98 - 111 mmol/L 107  105  105   CO2 22 - 32 mmol/L 29  29  27   $ Calcium 8.9 - 10.3 mg/dL 9.1  9.4  9.4   Total Protein 6.5 - 8.1 g/dL 6.8  7.2  7.0   Total Bilirubin 0.3 - 1.2 mg/dL 0.6  0.5  0.7   Alkaline Phos 38 - 126 U/L 63  60  65   AST 15 - 41 U/L 17  20  16   $ ALT 0 - 44 U/L 18  17  16     $ Lab Results  Component Value Date   WBC 4.6 10/20/2022   HGB 13.0 10/20/2022   HCT 37.8 10/20/2022   MCV 87.7 10/20/2022   PLT 220 10/20/2022   NEUTROABS 1.9 10/20/2022    ASSESSMENT & PLAN:  No problem-specific Assessment & Plan notes found for this encounter.    No orders of the defined types were placed in this encounter.  The patient has a good understanding of the overall plan. she agrees with it. she will call with any problems that may develop before the next visit here. Total time spent: 30 mins including face to face time and time spent for planning, charting and co-ordination of care   Suzzette Righter, [place] 11/29/22    I Gardiner Coins am acting as a Education administrator for [employer]  ***

## 2022-11-30 NOTE — Assessment & Plan Note (Signed)
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  03/16/2022 Left lumpectomy: Grade 3 IDC 5 mm (6 mm on biopsy), negative for lymphovascular invasion, ER 100%, PR 2%, HER2 FISH positive, margins negative, 0/2 lymph nodes negative 2.  based on only 6 mm tumor size, we discussed the pros and cons of systemic treatments and decided to treat her with Herceptin with letrozole. 3.  Adjuvant radiation completed 05/26/2022 4.  Followed by adjuvant antiestrogen therapy ---------------------------------------------------------------------------- Current treatment: Herceptin started 04/07/2022 with letrozole Herceptin toxicities: Denies any adverse effects   Letrozole toxicities: Denies any hot flashes or arthralgias Bone density December 2023:T-score 0: Normal    partner Crystal is also a patient of mine. Return to clinic every 3 weeks for Herceptin injections every 6 weeks and follow-up with me.

## 2022-12-01 ENCOUNTER — Inpatient Hospital Stay: Payer: Medicare Other | Attending: Hematology and Oncology

## 2022-12-01 ENCOUNTER — Encounter: Payer: Self-pay | Admitting: Hematology and Oncology

## 2022-12-01 ENCOUNTER — Inpatient Hospital Stay (HOSPITAL_BASED_OUTPATIENT_CLINIC_OR_DEPARTMENT_OTHER): Payer: Medicare Other | Admitting: Hematology and Oncology

## 2022-12-01 VITALS — BP 149/81 | HR 66 | Temp 97.5°F | Resp 18 | Ht 67.0 in | Wt 153.7 lb

## 2022-12-01 DIAGNOSIS — Z170 Estrogen receptor positive status [ER+]: Secondary | ICD-10-CM

## 2022-12-01 DIAGNOSIS — Z79811 Long term (current) use of aromatase inhibitors: Secondary | ICD-10-CM | POA: Diagnosis not present

## 2022-12-01 DIAGNOSIS — C50412 Malignant neoplasm of upper-outer quadrant of left female breast: Secondary | ICD-10-CM | POA: Insufficient documentation

## 2022-12-01 DIAGNOSIS — Z5112 Encounter for antineoplastic immunotherapy: Secondary | ICD-10-CM | POA: Insufficient documentation

## 2022-12-01 DIAGNOSIS — Z79899 Other long term (current) drug therapy: Secondary | ICD-10-CM | POA: Insufficient documentation

## 2022-12-01 DIAGNOSIS — Z923 Personal history of irradiation: Secondary | ICD-10-CM | POA: Diagnosis not present

## 2022-12-01 MED ORDER — TRASTUZUMAB-HYALURONIDASE-OYSK 600-10000 MG-UNT/5ML ~~LOC~~ SOLN
600.0000 mg | Freq: Once | SUBCUTANEOUS | Status: AC
  Administered 2022-12-01: 600 mg via SUBCUTANEOUS
  Filled 2022-12-01: qty 5

## 2022-12-01 NOTE — Patient Instructions (Signed)
Julie Pugh  Discharge Instructions: Thank you for choosing [facility] to provide your oncology and hematology care.   If you have a lab appointment with the [facility], please go directly to the [facility] and check in at the registration area.   Wear comfortable clothing and clothing appropriate for easy access to any Portacath or PICC line.   We strive to give you quality time with your provider. You may need to reschedule your appointment if you arrive late (15 or more minutes).  Arriving late affects you and other patients whose appointments are after yours.  Also, if you miss three or more appointments without notifying the office, you may be dismissed from the clinic at the provider's discretion.      For prescription refill requests, have your pharmacy contact our office and allow 72 hours for refills to be completed.    Today you received the following chemotherapy and/or immunotherapy agents : Herceptin Hylecta      To help prevent nausea and vomiting after your treatment, we encourage you to take your nausea medication as directed.  BELOW ARE SYMPTOMS THAT SHOULD BE REPORTED IMMEDIATELY: *FEVER GREATER THAN 100.4 F (38 C) OR HIGHER *CHILLS OR SWEATING *NAUSEA AND VOMITING THAT IS NOT CONTROLLED WITH YOUR NAUSEA MEDICATION *UNUSUAL SHORTNESS OF BREATH *UNUSUAL BRUISING OR BLEEDING *URINARY PROBLEMS (pain or burning when urinating, or frequent urination) *BOWEL PROBLEMS (unusual diarrhea, constipation, pain near the anus) TENDERNESS IN MOUTH AND THROAT WITH OR WITHOUT PRESENCE OF ULCERS (sore throat, sores in mouth, or a toothache) UNUSUAL RASH, SWELLING OR PAIN  UNUSUAL VAGINAL DISCHARGE OR ITCHING   Items with * indicate a potential emergency and should be followed up as soon as possible or go to the Emergency Department if any problems should occur.  Please show the CHEMOTHERAPY ALERT CARD or IMMUNOTHERAPY ALERT CARD  at check-in to the Emergency Department and triage nurse.  Should you have questions after your visit or need to cancel or reschedule your appointment, please contact [facility]  Dept: [phone]  and follow the prompts.  Office hours are 8:00 a.m. to 4:30 p.m. Monday - Friday. Please note that voicemails left after 4:00 p.m. may not be returned until the following business day.  We are closed weekends and major holidays. You have access to a nurse at all times for urgent questions. Please call the main number to the clinic Dept: [phone] and follow the prompts.   For any non-urgent questions, you may also contact your provider using MyChart. We now offer e-Visits for anyone 93 and older to request care online for non-urgent symptoms. For details visit [email or web address].   Also download the MyChart app! Go to the app store, search "MyChart", open the app, select [facility], and log in with your MyChart username and password.

## 2022-12-03 ENCOUNTER — Ambulatory Visit (INDEPENDENT_AMBULATORY_CARE_PROVIDER_SITE_OTHER): Payer: Medicare Other | Admitting: Family Medicine

## 2022-12-03 ENCOUNTER — Encounter: Payer: Self-pay | Admitting: Family Medicine

## 2022-12-03 ENCOUNTER — Encounter: Payer: Self-pay | Admitting: Hematology and Oncology

## 2022-12-03 VITALS — BP 124/60 | HR 96 | Temp 98.8°F | Resp 17 | Ht 67.0 in | Wt 154.4 lb

## 2022-12-03 DIAGNOSIS — Z0000 Encounter for general adult medical examination without abnormal findings: Secondary | ICD-10-CM

## 2022-12-03 DIAGNOSIS — I10 Essential (primary) hypertension: Secondary | ICD-10-CM

## 2022-12-03 DIAGNOSIS — Z9889 Other specified postprocedural states: Secondary | ICD-10-CM

## 2022-12-03 NOTE — Patient Instructions (Signed)
Follow up in 6 months to recheck BP We'll notify you of your lab results and make any changes if needed Keep up the good work on healthy diet and regular exercise- you look great!!! Call with any questions or concerns Stay Safe!  Stay Healthy! Happy Spring!!!

## 2022-12-03 NOTE — Progress Notes (Signed)
   Subjective:    Patient ID: Julie Pugh, female    DOB: Feb 25, 1956, 67 y.o.   MRN: [id number]  HPI CPE- UTD on mammo, colonoscopy, Tdap,   Patient Care Team    Relationship Specialty Notifications Start End  [name], MD PCP - General Family Medicine  01/30/16   Philemon Kingdom, MD Consulting Physician Internal Medicine  04/02/16   Nicholas Lose, MD Medical Oncologist Hematology and Oncology  02/17/22     Health Maintenance  Topic Date Due   Medicare Annual Wellness (AWV)  Never done   INFLUENZA VACCINE  01/09/2023 (Originally 05/11/2022)   Pneumonia Vaccine 24+ Years old (1 of 1 - PCV) 11/11/2023 (Originally 01/22/2019)   Hepatitis C Screening  10/07/2098 (Originally 01/22/1974)   MAMMOGRAM  01/08/2024   COLONOSCOPY (Pts 45-47yr Insurance coverage will need to be confirmed)  07/18/2029   DEXA SCAN  Completed   HPV VACCINES  Aged Out   DTaP/Tdap/Td  Discontinued   COVID-19 Vaccine  Discontinued   Zoster Vaccines- Shingrix  Discontinued      Review of Systems Patient reports no vision/ hearing changes, adenopathy,fever, weight change,  persistant/recurrent hoarseness , swallowing issues, chest pain, palpitations, edema, persistant/recurrent cough, hemoptysis, dyspnea (rest/exertional/paroxysmal nocturnal), gastrointestinal bleeding (melena, rectal bleeding), abdominal pain, significant heartburn, bowel changes, GU symptoms (dysuria, hematuria, incontinence), Gyn symptoms (abnormal  bleeding, pain),  syncope, focal weakness, memory loss, numbness & tingling, skin/hair/nail changes, abnormal bruising or bleeding, anxiety, or depression.     Objective:   Physical Exam General Appearance:    Alert, cooperative, no distress, appears stated age  Head:    Normocephalic, without obvious abnormality, atraumatic  Eyes:    PERRL, conjunctiva/corneas clear, EOM's intact both eyes  Ears:    Normal TM's and external ear canals, both ears  Nose:   Nares normal, septum midline,  mucosa normal, no drainage    or sinus tenderness  Throat:   Lips, mucosa, and tongue normal; teeth and gums normal  Neck:   Supple, symmetrical, trachea midline, no adenopathy;    Thyroid: no enlargement/tenderness/nodules  Back:     Symmetric, no curvature, ROM normal, no CVA tenderness  Lungs:     Clear to auscultation bilaterally, respirations unlabored  Chest Wall:    No tenderness or deformity   Heart:    Regular rate and rhythm, S1 and S2 normal, no murmur, rub   or gallop  Breast Exam:    Deferred to GYN  Abdomen:     Soft, non-tender, bowel sounds active all four quadrants,    no masses, no organomegaly  Genitalia:    Deferred to GYN  Rectal:    Extremities:   Extremities normal, atraumatic, no cyanosis or edema  Pulses:   2+ and symmetric all extremities  Skin:   Skin color, texture, turgor normal, no rashes or lesions  Lymph nodes:   Cervical, supraclavicular, and axillary nodes normal  Neurologic:   CNII-XII intact, normal strength, sensation and reflexes    throughout          Assessment & Plan:

## 2022-12-03 NOTE — Assessment & Plan Note (Signed)
Pt's PE WNL.  UTD on mammo, colonoscopy, Tdap.  Check labs.  Anticipatory guidance provided.

## 2022-12-03 NOTE — Assessment & Plan Note (Signed)
Chronic problem.  Well controlled.  Currently asymptomatic.  No med changes at this time.

## 2022-12-04 ENCOUNTER — Encounter: Payer: Self-pay | Admitting: Hematology and Oncology

## 2022-12-04 LAB — CBC WITH DIFFERENTIAL/PLATELET
Absolute Monocytes: 510 cells/uL (ref 200–950)
Basophils Absolute: 50 cells/uL (ref 0–200)
Basophils Relative: 1 %
Eosinophils Absolute: 90 cells/uL (ref 15–500)
Eosinophils Relative: 1.8 %
HCT: 37.7 % (ref 35.0–45.0)
Hemoglobin: 13.3 g/dL (ref 11.7–15.5)
Lymphs Abs: 2320 cells/uL (ref 850–3900)
MCH: 30.2 pg (ref 27.0–33.0)
MCHC: 35.3 g/dL (ref 32.0–36.0)
MCV: 85.7 fL (ref 80.0–100.0)
MPV: 9.9 fL (ref 7.5–12.5)
Monocytes Relative: 10.2 %
Neutro Abs: 2030 cells/uL (ref 1500–7800)
Neutrophils Relative %: 40.6 %
Platelets: 256 10*3/uL (ref 140–400)
RBC: 4.4 10*6/uL (ref 3.80–5.10)
RDW: 13 % (ref 11.0–15.0)
Total Lymphocyte: 46.4 %
WBC: 5 10*3/uL (ref 3.8–10.8)

## 2022-12-04 LAB — HEPATIC FUNCTION PANEL
AG Ratio: 1.3 (calc) (ref 1.0–2.5)
ALT: 15 U/L (ref 6–29)
AST: 16 U/L (ref 10–35)
Albumin: 3.9 g/dL (ref 3.6–5.1)
Alkaline phosphatase (APISO): 65 U/L (ref 37–153)
Bilirubin, Direct: 0.1 mg/dL (ref 0.0–0.2)
Globulin: 2.9 g/dL (calc) (ref 1.9–3.7)
Indirect Bilirubin: 0.4 mg/dL (calc) (ref 0.2–1.2)
Total Bilirubin: 0.5 mg/dL (ref 0.2–1.2)
Total Protein: 6.8 g/dL (ref 6.1–8.1)

## 2022-12-04 LAB — LIPID PANEL
Cholesterol: 216 mg/dL — ABNORMAL HIGH
HDL: 59 mg/dL
LDL Cholesterol (Calc): 135 mg/dL — ABNORMAL HIGH
Non-HDL Cholesterol (Calc): 157 mg/dL — ABNORMAL HIGH
Total CHOL/HDL Ratio: 3.7 (calc)
Triglycerides: 110 mg/dL

## 2022-12-04 LAB — BASIC METABOLIC PANEL WITH GFR
BUN: 23 mg/dL (ref 7–25)
CO2: 22 mmol/L (ref 20–32)
Calcium: 9.3 mg/dL (ref 8.6–10.4)
Chloride: 105 mmol/L (ref 98–110)
Creat: 0.92 mg/dL (ref 0.50–1.05)
Glucose, Bld: 84 mg/dL (ref 65–99)
Potassium: 4.5 mmol/L (ref 3.5–5.3)
Sodium: 140 mmol/L (ref 135–146)

## 2022-12-04 LAB — TSH: TSH: 1.9 mIU/L (ref 0.40–4.50)

## 2022-12-06 ENCOUNTER — Telehealth: Payer: Self-pay

## 2022-12-06 NOTE — Telephone Encounter (Signed)
-----   Message from [name], MD sent at 12/04/2022  4:42 PM EST ----- Labs look great!  LDL (bad) cholesterol is mildly elevated but your ratio of good to bad is excellent and no medication needed.

## 2022-12-06 NOTE — Telephone Encounter (Signed)
Left Lab results on pt VM

## 2022-12-16 ENCOUNTER — Telehealth: Payer: Self-pay | Admitting: Hematology and Oncology

## 2022-12-16 NOTE — Telephone Encounter (Signed)
Scheduled appointments per WQ. Patient is aware of the made appointments.

## 2022-12-17 ENCOUNTER — Other Ambulatory Visit: Payer: Self-pay | Admitting: Hematology and Oncology

## 2022-12-17 DIAGNOSIS — Z170 Estrogen receptor positive status [ER+]: Secondary | ICD-10-CM

## 2022-12-22 ENCOUNTER — Inpatient Hospital Stay: Payer: Medicare Other

## 2022-12-24 ENCOUNTER — Inpatient Hospital Stay: Payer: Medicare Other | Attending: Hematology and Oncology

## 2022-12-24 VITALS — BP 138/70 | HR 66 | Temp 97.7°F | Resp 16

## 2022-12-24 DIAGNOSIS — Z79811 Long term (current) use of aromatase inhibitors: Secondary | ICD-10-CM | POA: Insufficient documentation

## 2022-12-24 DIAGNOSIS — Z923 Personal history of irradiation: Secondary | ICD-10-CM | POA: Diagnosis not present

## 2022-12-24 DIAGNOSIS — C50412 Malignant neoplasm of upper-outer quadrant of left female breast: Secondary | ICD-10-CM | POA: Insufficient documentation

## 2022-12-24 DIAGNOSIS — Z5112 Encounter for antineoplastic immunotherapy: Secondary | ICD-10-CM | POA: Diagnosis not present

## 2022-12-24 DIAGNOSIS — Z170 Estrogen receptor positive status [ER+]: Secondary | ICD-10-CM | POA: Diagnosis not present

## 2022-12-24 MED ORDER — TRASTUZUMAB-HYALURONIDASE-OYSK 600-10000 MG-UNT/5ML ~~LOC~~ SOLN
600.0000 mg | Freq: Once | SUBCUTANEOUS | Status: AC
  Administered 2022-12-24: 600 mg via SUBCUTANEOUS
  Filled 2022-12-24: qty 5

## 2022-12-24 NOTE — Patient Instructions (Signed)
[facility]  Discharge Instructions: Thank you for choosing [facility] to provide your oncology and hematology care.   If you have a lab appointment with the Cancer Center, please go directly to the Cancer Center and check in at the registration area.   Wear comfortable clothing and clothing appropriate for easy access to any Portacath or PICC line.   We strive to give you quality time with your provider. You may need to reschedule your appointment if you arrive late (15 or more minutes).  Arriving late affects you and other patients whose appointments are after yours.  Also, if you miss three or more appointments without notifying the office, you may be dismissed from the clinic at the provider's discretion.      For prescription refill requests, have your pharmacy contact our office and allow 72 hours for refills to be completed.    Today you received the following chemotherapy and/or immunotherapy agents: Herceptin Hylecta      To help prevent nausea and vomiting after your treatment, we encourage you to take your nausea medication as directed.  BELOW ARE SYMPTOMS THAT SHOULD BE REPORTED IMMEDIATELY: *FEVER GREATER THAN 100.4 F (38 C) OR HIGHER *CHILLS OR SWEATING *NAUSEA AND VOMITING THAT IS NOT CONTROLLED WITH YOUR NAUSEA MEDICATION *UNUSUAL SHORTNESS OF BREATH *UNUSUAL BRUISING OR BLEEDING *URINARY PROBLEMS (pain or burning when urinating, or frequent urination) *BOWEL PROBLEMS (unusual diarrhea, constipation, pain near the anus) TENDERNESS IN MOUTH AND THROAT WITH OR WITHOUT PRESENCE OF ULCERS (sore throat, sores in mouth, or a toothache) UNUSUAL RASH, SWELLING OR PAIN  UNUSUAL VAGINAL DISCHARGE OR ITCHING   Items with * indicate a potential emergency and should be followed up as soon as possible or go to the Emergency Department if any problems should occur.  Please show the CHEMOTHERAPY ALERT CARD or IMMUNOTHERAPY ALERT CARD  at check-in to the Emergency Department and triage nurse.  Should you have questions after your visit or need to cancel or reschedule your appointment, please contact [facility]  Dept: [phone]  and follow the prompts.  Office hours are 8:00 a.m. to 4:30 p.m. Monday - Friday. Please note that voicemails left after 4:00 p.m. may not be returned until the following business day.  We are closed weekends and major holidays. You have access to a nurse at all times for urgent questions. Please call the main number to the clinic Dept: [phone] and follow the prompts.   For any non-urgent questions, you may also contact your provider using MyChart. We now offer e-Visits for anyone 18 and older to request care online for non-urgent symptoms. For details visit [email or web address].   Also download the MyChart app! Go to the app store, search "MyChart", open the app, select , and log in with your MyChart username and password.   

## 2022-12-30 DIAGNOSIS — G4733 Obstructive sleep apnea (adult) (pediatric): Secondary | ICD-10-CM

## 2022-12-30 DIAGNOSIS — R0683 Snoring: Secondary | ICD-10-CM

## 2022-12-30 NOTE — Telephone Encounter (Signed)
Of course that is fine.

## 2022-12-30 NOTE — Telephone Encounter (Signed)
Mychart message sent by pt:  Julie Pugh [facility] (supporting Tammy S Parrett, NP)2 hours ago (8:20 AM)    Please send a script for my oral appliance and a copy of my sleep study to Dr. Lenard Simmer Email: [email or web address] or fax: [phone].  Thank you.     See that an order was placed 11/09/22 for pt to be referred to Dr. Toy Cookey but pt is now wanting order to go to different office.  Tammy, please advise.

## 2023-01-05 NOTE — Progress Notes (Signed)
Patient Care Team: [name], MD as PCP - General (Family Medicine) Philemon Kingdom, MD as Consulting Physician (Internal Medicine) Nicholas Lose, MD as Medical Oncologist (Hematology and Oncology)  DIAGNOSIS: No diagnosis found.  SUMMARY OF ONCOLOGIC HISTORY: Oncology History  Malignant neoplasm of upper-outer quadrant of left breast in female, estrogen receptor positive ([place])  02/12/2022 Initial Diagnosis   Screening mammogram detected left breast mass, by ultrasound measured 0.6 cm, biopsy revealed grade 2 IDC ER 100%, PR 2%, HER2 equivocal by IHC and FISH positive, Ki-67 30%   02/17/2022 Cancer Staging   Staging form: Breast, AJCC 8th Edition - Clinical: Stage IA (cT1b, cN0, cM0, G2, ER+, PR-, HER2+) - Signed by Nicholas Lose, MD on 02/17/2022 Stage prefix: Initial diagnosis Histologic grading system: 3 grade system   03/16/2022 Surgery   Left lumpectomy: Grade 3 IDC 5 mm, negative for lymphovascular invasion, ER 100%, PR 2%, HER2 FISH positive, margins negative, 0/2 lymph nodes negative   03/29/2022 -  Anti-estrogen oral therapy   Letrozole daily   04/07/2022 - 05/19/2022 Chemotherapy   Patient is on Treatment Plan : BREAST Trastuzumab q21d x 13 cycles     04/07/2022 - 12/01/2022 Chemotherapy   Patient is on Treatment Plan : BREAST Trastuzumab IV (8/6) or SQ (600) D1 q21d     04/29/2022 - 05/26/2022 Radiation Therapy   Adjuvant Radiation therapy   05/05/2022 Genetic Testing   Referral pending   05/09/2022 Cancer Staging   Staging form: Breast, AJCC 8th Edition - Pathologic: Stage IA (pT1a, pN0, cM0, G3, ER+, PR-, HER2+) - Signed by Gardenia Phlegm, NP on 05/09/2022 Histologic grading system: 3 grade system   05/13/2022 Genetic Testing   Negative genetic testing on the CancerNext-Expanded+RNAinsight panel.  The report date is May 13, 2022.  The CancerNext-Expanded gene panel offered by [facility] and includes sequencing and rearrangement analysis for the  following 77 genes: AIP, ALK, APC*, ATM*, AXIN2, BAP1, BARD1, BLM, BMPR1A, BRCA1*, BRCA2*, BRIP1*, CDC73, CDH1*, CDK4, CDKN1B, CDKN2A, CHEK2*, CTNNA1, DICER1, FANCC, FH, FLCN, GALNT12, KIF1B, LZTR1, MAX, MEN1, MET, MLH1*, MSH2*, MSH3, MSH6*, MUTYH*, NBN, NF1*, NF2, NTHL1, PALB2*, PHOX2B, PMS2*, POT1, PRKAR1A, PTCH1, PTEN*, RAD51C*, RAD51D*, RB1, RECQL, RET, SDHA, SDHAF2, SDHB, SDHC, SDHD, SMAD4, SMARCA4, SMARCB1, SMARCE1, STK11, SUFU, TMEM127, TP53*, TSC1, TSC2, VHL and XRCC2 (sequencing and deletion/duplication); EGFR, EGLN1, HOXB13, KIT, MITF, PDGFRA, POLD1, and POLE (sequencing only); EPCAM and GREM1 (deletion/duplication only). DNA and RNA analyses performed for * genes.    12/24/2022 -  Chemotherapy   Patient is on Treatment Plan : BREAST MAINTENANCE Trastuzumab IV (6) or SQ (600) D1 q21d x 13 cycles       CHIEF COMPLIANT: Follow-up left lumpectomy on herceptin and letrozole     INTERVAL HISTORY: Julie Pugh is a 67 y.o with the above mention left lumpectomy on herceptin and letrozole. She presents to the clinic today for a follow-up.    ALLERGIES:  has No Known Allergies.  MEDICATIONS:  Current Outpatient Medications  Medication Sig Dispense Refill   amLODipine (NORVASC) 2.5 MG tablet Take 1 tablet (2.5 mg total) by mouth daily. 90 tablet 1   Bioflavonoid Products (ESTER-C) 500-200-60 MG TABS Take by mouth.     Cholecalciferol (VITAMIN D) 2000 units tablet Take 5,000 Units by mouth daily.     diphenhydramine-acetaminophen (TYLENOL PM) 25-500 MG TABS tablet Take 1 tablet by mouth at bedtime as needed.     letrozole (FEMARA) 2.5 MG tablet Take 1 tablet (2.5 mg total) by mouth  daily. 90 tablet 3   levothyroxine (SYNTHROID) 100 MCG tablet TAKE 1 TABLET(100 MCG) BY MOUTH DAILY 90 tablet 3   METRONIDAZOLE, TOPICAL, 0.75 % LOTN      No current facility-administered medications for this visit.    PHYSICAL EXAMINATION: ECOG PERFORMANCE STATUS: {CHL ONC ECOG PS:[id number]}  There were  no vitals filed for this visit. There were no vitals filed for this visit.  BREAST:*** No palpable masses or nodules in either right or left breasts. No palpable axillary supraclavicular or infraclavicular adenopathy no breast tenderness or nipple discharge. (exam performed in the presence of a chaperone)  LABORATORY DATA:  I have reviewed the data as listed    Latest Ref Rng & Units 12/03/2022    2:09 PM 10/20/2022    9:01 AM 10/22/2021    1:21 PM  CMP  Glucose 65 - 99 mg/dL 84  85  87   BUN 7 - 25 mg/dL 23  19  20    Creatinine 0.50 - 1.05 mg/dL 0.92  0.91  0.95   Sodium 135 - 146 mmol/L 140  139  140   Potassium 3.5 - 5.3 mmol/L 4.5  4.0  4.2   Chloride 98 - 110 mmol/L 105  107  105   CO2 20 - 32 mmol/L 22  29  29    Calcium 8.6 - 10.4 mg/dL 9.3  9.1  9.4   Total Protein 6.1 - 8.1 g/dL 6.8  6.8  7.2   Total Bilirubin 0.2 - 1.2 mg/dL 0.5  0.6  0.5   Alkaline Phos 38 - 126 U/L  63  60   AST 10 - 35 U/L 16  17  20    ALT 6 - 29 U/L 15  18  17      Lab Results  Component Value Date   WBC 5.0 12/03/2022   HGB 13.3 12/03/2022   HCT 37.7 12/03/2022   MCV 85.7 12/03/2022   PLT 256 12/03/2022   NEUTROABS 2,030 12/03/2022    ASSESSMENT & PLAN:  No problem-specific Assessment & Plan notes found for this encounter.    No orders of the defined types were placed in this encounter.  The patient has a good understanding of the overall plan. she agrees with it. she will call with any problems that may develop before the next visit here. Total time spent: 30 mins including face to face time and time spent for planning, charting and co-ordination of care   Suzzette Righter, [place] 01/05/23    I Gardiner Coins am acting as a Education administrator for [employer]  ***

## 2023-01-11 NOTE — Assessment & Plan Note (Signed)
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  03/16/2022 Left lumpectomy: Grade 3 IDC 5 mm (6 mm on biopsy), negative for lymphovascular invasion, ER 100%, PR 2%, HER2 FISH positive, margins negative, 0/2 lymph nodes negative 2.  based on only 6 mm tumor size, we discussed the pros and cons of systemic treatments and decided to treat her with Herceptin with letrozole. 3.  Adjuvant radiation completed 05/26/2022 4.  Followed by adjuvant antiestrogen therapy ---------------------------------------------------------------------------- Current treatment: Herceptin started 04/07/2022 with letrozole Herceptin toxicities: Denies any adverse effects   Letrozole toxicities: Denies any hot flashes or arthralgias Bone density December 2023:T-score 0: Normal    partner Crystal is also a patient of mine.  They are both avid tennis players She is going to [place] in [place] coming up. Return to clinic every 3 weeks for Herceptin injections every 6 weeks and follow-up with me.

## 2023-01-12 ENCOUNTER — Inpatient Hospital Stay: Payer: Medicare Other | Attending: Hematology and Oncology

## 2023-01-12 ENCOUNTER — Inpatient Hospital Stay (HOSPITAL_BASED_OUTPATIENT_CLINIC_OR_DEPARTMENT_OTHER): Payer: Medicare Other | Admitting: Hematology and Oncology

## 2023-01-12 ENCOUNTER — Encounter: Payer: Self-pay | Admitting: *Deleted

## 2023-01-12 VITALS — BP 147/67 | HR 62 | Temp 97.3°F | Resp 18 | Ht 67.0 in | Wt 153.6 lb

## 2023-01-12 DIAGNOSIS — Z9221 Personal history of antineoplastic chemotherapy: Secondary | ICD-10-CM | POA: Insufficient documentation

## 2023-01-12 DIAGNOSIS — Z79899 Other long term (current) drug therapy: Secondary | ICD-10-CM | POA: Diagnosis not present

## 2023-01-12 DIAGNOSIS — Z923 Personal history of irradiation: Secondary | ICD-10-CM | POA: Insufficient documentation

## 2023-01-12 DIAGNOSIS — C50412 Malignant neoplasm of upper-outer quadrant of left female breast: Secondary | ICD-10-CM | POA: Diagnosis not present

## 2023-01-12 DIAGNOSIS — Z170 Estrogen receptor positive status [ER+]: Secondary | ICD-10-CM | POA: Diagnosis not present

## 2023-01-12 DIAGNOSIS — Z79811 Long term (current) use of aromatase inhibitors: Secondary | ICD-10-CM | POA: Insufficient documentation

## 2023-01-12 DIAGNOSIS — Z5112 Encounter for antineoplastic immunotherapy: Secondary | ICD-10-CM | POA: Diagnosis not present

## 2023-01-12 MED ORDER — TRASTUZUMAB-HYALURONIDASE-OYSK 600-10000 MG-UNT/5ML ~~LOC~~ SOLN
600.0000 mg | Freq: Once | SUBCUTANEOUS | Status: AC
  Administered 2023-01-12: 600 mg via SUBCUTANEOUS
  Filled 2023-01-12: qty 5

## 2023-01-12 NOTE — Patient Instructions (Signed)
[facility]  Discharge Instructions: Thank you for choosing [facility] to provide your oncology and hematology care.   If you have a lab appointment with the Cancer Center, please go directly to the Cancer Center and check in at the registration area.   Wear comfortable clothing and clothing appropriate for easy access to any Portacath or PICC line.   We strive to give you quality time with your provider. You may need to reschedule your appointment if you arrive late (15 or more minutes).  Arriving late affects you and other patients whose appointments are after yours.  Also, if you miss three or more appointments without notifying the office, you may be dismissed from the clinic at the provider's discretion.      For prescription refill requests, have your pharmacy contact our office and allow 72 hours for refills to be completed.    Today you received the following chemotherapy and/or immunotherapy agents: Herceptin Hylecta      To help prevent nausea and vomiting after your treatment, we encourage you to take your nausea medication as directed.  BELOW ARE SYMPTOMS THAT SHOULD BE REPORTED IMMEDIATELY: *FEVER GREATER THAN 100.4 F (38 C) OR HIGHER *CHILLS OR SWEATING *NAUSEA AND VOMITING THAT IS NOT CONTROLLED WITH YOUR NAUSEA MEDICATION *UNUSUAL SHORTNESS OF BREATH *UNUSUAL BRUISING OR BLEEDING *URINARY PROBLEMS (pain or burning when urinating, or frequent urination) *BOWEL PROBLEMS (unusual diarrhea, constipation, pain near the anus) TENDERNESS IN MOUTH AND THROAT WITH OR WITHOUT PRESENCE OF ULCERS (sore throat, sores in mouth, or a toothache) UNUSUAL RASH, SWELLING OR PAIN  UNUSUAL VAGINAL DISCHARGE OR ITCHING   Items with * indicate a potential emergency and should be followed up as soon as possible or go to the Emergency Department if any problems should occur.  Please show the CHEMOTHERAPY ALERT CARD or IMMUNOTHERAPY ALERT CARD  at check-in to the Emergency Department and triage nurse.  Should you have questions after your visit or need to cancel or reschedule your appointment, please contact [facility]  Dept: [phone]  and follow the prompts.  Office hours are 8:00 a.m. to 4:30 p.m. Monday - Friday. Please note that voicemails left after 4:00 p.m. may not be returned until the following business day.  We are closed weekends and major holidays. You have access to a nurse at all times for urgent questions. Please call the main number to the clinic Dept: [phone] and follow the prompts.   For any non-urgent questions, you may also contact your provider using MyChart. We now offer e-Visits for anyone 18 and older to request care online for non-urgent symptoms. For details visit [email or web address].   Also download the MyChart app! Go to the app store, search "MyChart", open the app, select [facility], and log in with your MyChart username and password.   

## 2023-01-13 ENCOUNTER — Ambulatory Visit
Admission: RE | Admit: 2023-01-13 | Discharge: 2023-01-13 | Disposition: A | Discharge status: Home / Self Care | Payer: Medicare Other | Source: Ambulatory Visit | Attending: Hematology and Oncology | Admitting: Hematology and Oncology

## 2023-01-13 DIAGNOSIS — Z170 Estrogen receptor positive status [ER+]: Secondary | ICD-10-CM

## 2023-01-13 DIAGNOSIS — Z853 Personal history of malignant neoplasm of breast: Secondary | ICD-10-CM | POA: Diagnosis not present

## 2023-01-13 HISTORY — DX: Malignant neoplasm of unspecified site of unspecified female breast: C50.919

## 2023-01-13 HISTORY — DX: Personal history of irradiation: Z92.3

## 2023-01-27 ENCOUNTER — Encounter: Payer: Self-pay | Admitting: Orthopaedic Surgery

## 2023-01-27 ENCOUNTER — Ambulatory Visit (INDEPENDENT_AMBULATORY_CARE_PROVIDER_SITE_OTHER): Payer: Medicare Other | Admitting: Orthopaedic Surgery

## 2023-01-27 ENCOUNTER — Other Ambulatory Visit (INDEPENDENT_AMBULATORY_CARE_PROVIDER_SITE_OTHER): Payer: Medicare Other

## 2023-01-27 DIAGNOSIS — M25562 Pain in left knee: Secondary | ICD-10-CM

## 2023-01-27 MED ORDER — DICLOFENAC SODIUM 75 MG PO TBEC: 75.0000 mg | DELAYED_RELEASE_TABLET | Freq: Two times a day (BID) | ORAL | 2 refills | Status: DC

## 2023-01-27 NOTE — Progress Notes (Signed)
Office Visit Note   Patient: Julie Pugh           Date of Birth: 1956-10-07           MRN: [id number] Visit Date: 01/27/2023              Requested by: Sheliah Hatch, MD [address] [place],  [place] [address] PCP: Sheliah Hatch, MD   Assessment & Plan: Visit Diagnoses:  1. Acute pain of left knee     Plan: Impression is 67 year old female with acute left knee pain.  Could be arthritis flare or a medial meniscal tear but her symptoms have greatly improved.  She does still have a little bit of an effusion which I think is symptomatic.  Based on treatment options she would like to try a 2-week course of diclofenac and activity modification.  She will let me know if she would like to try an injection or if the symptoms do not improve.  Follow-Up Instructions: No follow-ups on file.   Orders:  Orders Placed This Encounter  Procedures   XR KNEE 3 VIEW LEFT   Meds ordered this encounter  Medications   diclofenac (VOLTAREN) 75 MG EC tablet    Sig: Take 1 tablet (75 mg total) by mouth 2 (two) times daily.    Dispense:  30 tablet    Refill:  2      Procedures: No procedures performed   Clinical Data: No additional findings.   Subjective: Chief Complaint  Patient presents with   Left Knee - Pain    HPI  Julie Pugh is a very pleasant 67 year old female who comes in for acute onset of left knee pain on the medial side since last Thursday.  She and her family were out in [place] visiting [place] parks and had to do a lot of hiking and had sudden onset of medial sided knee pain after one of the days of hiking.  She does not recall any specific injuries or trauma.  Originally she could barely bear weight and her pain was 10 out of 10 but now has decreased to 3 out of 10 and can bear weight.  She does feel some slight swelling in the knee.  She had to [place] early [place].  Review of Systems  Constitutional: Negative.   HENT: Negative.     Eyes: Negative.   Respiratory: Negative.    Cardiovascular: Negative.   Endocrine: Negative.   Musculoskeletal: Negative.   Neurological: Negative.   Hematological: Negative.   Psychiatric/Behavioral: Negative.    All other systems reviewed and are negative.    Objective: Vital Signs: There were no vitals taken for this visit.  Physical Exam Vitals and nursing note reviewed.  Constitutional:      Appearance: She is well-developed.  HENT:     Head: Atraumatic.     Nose: Nose normal.  Eyes:     Extraocular Movements: Extraocular movements intact.  Cardiovascular:     Pulses: Normal pulses.  Pulmonary:     Effort: Pulmonary effort is normal.  Abdominal:     Palpations: Abdomen is soft.  Musculoskeletal:     Cervical back: Neck supple.  Skin:    General: Skin is warm.     Capillary Refill: Capillary refill takes less than 2 seconds.  Neurological:     Mental Status: She is alert. Mental status is at baseline.  Psychiatric:        Behavior:  Behavior normal.        Thought Content: Thought content normal.        Judgment: Judgment normal.     Ortho Exam  Examination of the left knee shows a small residual joint effusion.  She does not have any medial joint line tenderness or McMurray's sign.  Collaterals and cruciates are stable.  Specialty Comments:  No specialty comments available.  Imaging: XR KNEE 3 VIEW LEFT  Result Date: 01/27/2023 Mild spurring of the patella.  No significant degenerative changes.  Well-preserved joint spaces.    PMFS History: Patient Active Problem List   Diagnosis Date Noted   OSA (obstructive sleep apnea) 11/09/2022   Snoring 08/13/2022   Bronchiectasis without complication 08/13/2022   HTN (hypertension) 07/04/2022   Physical exam 05/20/2022   Family history of breast cancer 05/05/2022   Family history of pancreatic cancer 05/05/2022   Family history of prostate cancer 05/05/2022   Elevated blood pressure reading 04/23/2022    Malignant neoplasm of upper-outer quadrant of left breast in female, estrogen receptor positive 02/12/2022   History of COVID-19 06/12/2020   Hyperlipidemia 07/10/2015   Hypothyroidism    History of melanoma    Varicose vein    Past Medical History:  Diagnosis Date   Breast cancer    Family history of breast cancer    Family history of pancreatic cancer    Family history of prostate cancer    Hx of melanoma of skin    Hyperlipidemia    Hypothyroidism    Dr. Asencion Islam managing. armour thyroid 60mg     Melanoma    R shin 2009. sees derm q6 months   Personal history of radiation therapy    Varicose vein     Family History  Problem Relation Age of Onset   Panic disorder Mother    COPD Mother        smoked until 11   Congestive Heart Failure Mother        pacemaker   Hypertension Mother    Heart attack Father        bypass 37, 32 fatal MI, former smoker   Prostate cancer Father 107   Hyperlipidemia Sister    Breast cancer Maternal Aunt    Pancreatic cancer Cousin 66    Past Surgical History:  Procedure Laterality Date   BREAST BIOPSY     benign 2000-stereotactic biopsy   BREAST BIOPSY Right 03/12/2004   BREAST LUMPECTOMY     BREAST LUMPECTOMY WITH RADIOACTIVE SEED AND SENTINEL LYMPH NODE BIOPSY Left 03/16/2022   Procedure: LEFT BREAST LUMPECTOMY WITH RADIOACTIVE SEED AND AXILLARY SENTINEL LYMPH NODE BIOPSY;  Surgeon: Emelia Loron, MD;  Location: [facility];  Service: General;  Laterality: Left;   VARICOSE VEIN SURGERY     2001   Social History   Occupational History   Not on file  Tobacco Use   Smoking status: Never    Passive exposure: Past   Smokeless tobacco: Never  Vaping Use   Vaping Use: Never used  Substance and Sexual Activity   Alcohol use: Yes    Alcohol/week: 4.0 standard drinks of alcohol    Types: 4 Standard drinks or equivalent per week   Drug use: No   Sexual activity: Yes    Birth control/protection: Post-menopausal

## 2023-01-28 NOTE — Progress Notes (Signed)
Patient Care Team: Sheliah Hatch, MD as PCP - General (Family Medicine) Carlus Pavlov, MD as Consulting Physician (Internal Medicine) Serena Croissant, MD as Medical Oncologist (Hematology and Oncology)  DIAGNOSIS:  Encounter Diagnosis  Name Primary?   Malignant neoplasm of upper-outer quadrant of left breast in female, estrogen receptor positive Yes    SUMMARY OF ONCOLOGIC HISTORY: Oncology History  Malignant neoplasm of upper-outer quadrant of left breast in female, estrogen receptor positive  02/12/2022 Initial Diagnosis   Screening mammogram detected left breast mass, by ultrasound measured 0.6 cm, biopsy revealed grade 2 IDC ER 100%, PR 2%, HER2 equivocal by IHC and FISH positive, Ki-67 30%   02/17/2022 Cancer Staging   Staging form: Breast, AJCC 8th Edition - Clinical: Stage IA (cT1b, cN0, cM0, G2, ER+, PR-, HER2+) - Signed by Serena Croissant, MD on 02/17/2022 Stage prefix: Initial diagnosis Histologic grading system: 3 grade system   03/16/2022 Surgery   Left lumpectomy: Grade 3 IDC 5 mm, negative for lymphovascular invasion, ER 100%, PR 2%, HER2 FISH positive, margins negative, 0/2 lymph nodes negative   03/29/2022 -  Anti-estrogen oral therapy   Letrozole daily   04/07/2022 - 05/19/2022 Chemotherapy   Patient is on Treatment Plan : BREAST Trastuzumab q21d x 13 cycles     04/07/2022 - 12/01/2022 Chemotherapy   Patient is on Treatment Plan : BREAST Trastuzumab IV (8/6) or SQ (600) D1 q21d     04/29/2022 - 05/26/2022 Radiation Therapy   Adjuvant Radiation therapy   05/05/2022 Genetic Testing   Referral pending   05/09/2022 Cancer Staging   Staging form: Breast, AJCC 8th Edition - Pathologic: Stage IA (pT1a, pN0, cM0, G3, ER+, PR-, HER2+) - Signed by Loa Socks, NP on 05/09/2022 Histologic grading system: 3 grade system   05/13/2022 Genetic Testing   Negative genetic testing on the CancerNext-Expanded+RNAinsight panel.  The report date is May 13, 2022.  The  CancerNext-Expanded gene panel offered by [facility] and includes sequencing and rearrangement analysis for the following 77 genes: AIP, ALK, APC*, ATM*, AXIN2, BAP1, BARD1, BLM, BMPR1A, BRCA1*, BRCA2*, BRIP1*, CDC73, CDH1*, CDK4, CDKN1B, CDKN2A, CHEK2*, CTNNA1, DICER1, FANCC, FH, FLCN, GALNT12, KIF1B, LZTR1, MAX, MEN1, MET, MLH1*, MSH2*, MSH3, MSH6*, MUTYH*, NBN, NF1*, NF2, NTHL1, PALB2*, PHOX2B, PMS2*, POT1, PRKAR1A, PTCH1, PTEN*, RAD51C*, RAD51D*, RB1, RECQL, RET, SDHA, SDHAF2, SDHB, SDHC, SDHD, SMAD4, SMARCA4, SMARCB1, SMARCE1, STK11, SUFU, TMEM127, TP53*, TSC1, TSC2, VHL and XRCC2 (sequencing and deletion/duplication); EGFR, EGLN1, HOXB13, KIT, MITF, PDGFRA, POLD1, and POLE (sequencing only); EPCAM and GREM1 (deletion/duplication only). DNA and RNA analyses performed for * genes.    12/24/2022 -  Chemotherapy   Patient is on Treatment Plan : BREAST MAINTENANCE Trastuzumab IV (6) or SQ (600) D1 q21d x 13 cycles       CHIEF COMPLIANT: Herceptin and letrozole follow-up  INTERVAL HISTORY: Julie Pugh is a 67 y.o with the above mentioned left lumpectomy on herceptin and letrozole. She presents to the clinic today for a follow-up.    ALLERGIES:  has No Known Allergies.  MEDICATIONS:  Current Outpatient Medications  Medication Sig Dispense Refill   amLODipine (NORVASC) 2.5 MG tablet Take 1 tablet (2.5 mg total) by mouth daily. 90 tablet 1   Bioflavonoid Products (ESTER-C) 500-200-60 MG TABS Take by mouth.     Cholecalciferol (VITAMIN D) 2000 units tablet Take 5,000 Units by mouth daily.     diclofenac (VOLTAREN) 75 MG EC tablet Take 1 tablet (75 mg total) by mouth 2 (two) times daily. 30  tablet 2   diphenhydramine-acetaminophen (TYLENOL PM) 25-500 MG TABS tablet Take 1 tablet by mouth at bedtime as needed.     letrozole (FEMARA) 2.5 MG tablet Take 1 tablet (2.5 mg total) by mouth daily. 90 tablet 3   levothyroxine (SYNTHROID) 100 MCG tablet TAKE 1 TABLET(100 MCG) BY MOUTH DAILY 90 tablet  3   METRONIDAZOLE, TOPICAL, 0.75 % LOTN      No current facility-administered medications for this visit.    PHYSICAL EXAMINATION: ECOG PERFORMANCE STATUS: {CHL ONC ECOG PS:[id number]}  There were no vitals filed for this visit. There were no vitals filed for this visit.  BREAST:*** No palpable masses or nodules in either right or left breasts. No palpable axillary supraclavicular or infraclavicular adenopathy no breast tenderness or nipple discharge. (exam performed in the presence of a chaperone)  LABORATORY DATA:  I have reviewed the data as listed    Latest Ref Rng & Units 12/03/2022    2:09 PM 10/20/2022    9:01 AM 10/22/2021    1:21 PM  CMP  Glucose 65 - 99 mg/dL 84  85  87   BUN 7 - 25 mg/dL Creatinine 0.50 - 1.05 mg/dL 4.09  8.11  9.14   Sodium 135 - 146 mmol/L 140  139  140   Potassium 3.5 - 5.3 mmol/L 4.5  4.0  4.2   Chloride 98 - 110 mmol/L 105  107  105   CO2 20 - 32 mmol/L Calcium 8.6 - 10.4 mg/dL 9.3  9.1  9.4   Total Protein 6.1 - 8.1 g/dL 6.8  6.8  7.2   Total Bilirubin 0.2 - 1.2 mg/dL 0.5  0.6  0.5   Alkaline Phos 38 - 126 U/L  63  60   AST 10 - 35 U/L ALT 6 - 29 U/L Lab Results  Component Value Date   WBC 5.0 12/03/2022   HGB 13.3 12/03/2022   HCT 37.7 12/03/2022   MCV 85.7 12/03/2022   PLT 256 12/03/2022   NEUTROABS 2,030 12/03/2022    ASSESSMENT & PLAN:  No problem-specific Assessment & Plan notes found for this encounter.    No orders of the defined types were placed in this encounter.  The patient has a good understanding of the overall plan. she agrees with it. she will call with any problems that may develop before the next visit here. Total time spent: 30 mins including face to face time and time spent for planning, charting and co-ordination of care   Sherlyn Lick, CMA 01/28/23    I Janan Ridge am acting as a Neurosurgeon for [employer]  ***

## 2023-01-31 ENCOUNTER — Ambulatory Visit: Payer: Medicare Other | Attending: General Surgery

## 2023-01-31 VITALS — Wt 152.2 lb

## 2023-01-31 DIAGNOSIS — Z483 Aftercare following surgery for neoplasm: Secondary | ICD-10-CM

## 2023-01-31 NOTE — Therapy (Signed)
OUTPATIENT PHYSICAL THERAPY SOZO SCREENING NOTE   Patient Name: Julie Pugh MRN: [id number] DOB:08/02/1956, 67 y.o., female Today's Date: 01/31/2023  PCP: Sheliah Hatch, MD REFERRING PROVIDER: Emelia Loron, MD   PT End of Session - 01/31/23 1513     Visit Number 2   # unchanged due to screen only   PT Start Time 1511    PT Stop Time 1516    PT Time Calculation (min) 5 min    Activity Tolerance Patient tolerated treatment well    Behavior During Therapy WFL for tasks assessed/performed             Past Medical History:  Diagnosis Date   Breast cancer    Family history of breast cancer    Family history of pancreatic cancer    Family history of prostate cancer    Hx of melanoma of skin    Hyperlipidemia    Hypothyroidism    Dr. Asencion Islam managing. armour thyroid     Melanoma    R shin 2009. sees derm q6 months   Personal history of radiation therapy    Varicose vein    Past Surgical History:  Procedure Laterality Date   BREAST BIOPSY     benign 2000-stereotactic biopsy   BREAST BIOPSY Right 03/12/2004   BREAST LUMPECTOMY     BREAST LUMPECTOMY WITH RADIOACTIVE SEED AND SENTINEL LYMPH NODE BIOPSY Left 03/16/2022   Procedure: LEFT BREAST LUMPECTOMY WITH RADIOACTIVE SEED AND AXILLARY SENTINEL LYMPH NODE BIOPSY;  Surgeon: Emelia Loron, MD;  Location: [facility];  Service: General;  Laterality: Left;   VARICOSE VEIN SURGERY     2001   Patient Active Problem List   Diagnosis Date Noted   OSA (obstructive sleep apnea) 11/09/2022   Snoring 08/13/2022   Bronchiectasis without complication 08/13/2022   HTN (hypertension) 07/04/2022   Physical exam 05/20/2022   Family history of breast cancer 05/05/2022   Family history of pancreatic cancer 05/05/2022   Family history of prostate cancer 05/05/2022   Elevated blood pressure reading 04/23/2022   Malignant neoplasm of upper-outer quadrant of left breast in female, estrogen receptor  positive 02/12/2022   History of COVID-19 06/12/2020   Hyperlipidemia 07/10/2015   Hypothyroidism    History of melanoma    Varicose vein     REFERRING DIAG: left breast cancer at risk for lymphedema  THERAPY DIAG: Aftercare following surgery for neoplasm  PERTINENT HISTORY: Patient was diagnosed on 02/08/22 with left grade 2. It measures less than 1 cm and is located in the upper outer quadrant. It is ER/PR+ (HER2 equivocal - waiting on results) with a Ki67 of 30%. L Lumpectomy and SLNB (0/2) on 03/16/22  PRECAUTIONS: left UE Lymphedema risk, None  SUBJECTIVE: Pt returns for her 3 month L-Dex screen.   PAIN:  Are you having pain? No  SOZO SCREENING: Patient was assessed today using the SOZO machine to determine the lymphedema index score. This was compared to her baseline score. It was determined that she is within the recommended range when compared to her baseline and no further action is needed at this time. She will continue SOZO screenings. These are done every 3 months for 2 years post operatively followed by every 6 months for 2 years, and then annually.   L-DEX FLOWSHEETS - 01/31/23 1500       L-DEX LYMPHEDEMA SCREENING   Measurement Type Unilateral    L-DEX MEASUREMENT EXTREMITY Upper Extremity    POSITION  Standing  DOMINANT SIDE Right    At Risk Side Left    BASELINE SCORE (UNILATERAL) -2.7    L-DEX SCORE (UNILATERAL) 0.8    VALUE CHANGE (UNILAT) 3.5               Hermenia Bers, PTA 01/31/2023, 3:16 PM

## 2023-02-02 ENCOUNTER — Inpatient Hospital Stay: Payer: Medicare Other

## 2023-02-02 ENCOUNTER — Inpatient Hospital Stay (HOSPITAL_BASED_OUTPATIENT_CLINIC_OR_DEPARTMENT_OTHER): Payer: Medicare Other | Admitting: Hematology and Oncology

## 2023-02-02 VITALS — BP 145/63 | HR 68 | Temp 97.9°F | Resp 18 | Ht 67.0 in | Wt 155.8 lb

## 2023-02-02 DIAGNOSIS — C50412 Malignant neoplasm of upper-outer quadrant of left female breast: Secondary | ICD-10-CM | POA: Diagnosis not present

## 2023-02-02 DIAGNOSIS — Z9221 Personal history of antineoplastic chemotherapy: Secondary | ICD-10-CM | POA: Diagnosis not present

## 2023-02-02 DIAGNOSIS — Z79811 Long term (current) use of aromatase inhibitors: Secondary | ICD-10-CM | POA: Diagnosis not present

## 2023-02-02 DIAGNOSIS — Z5112 Encounter for antineoplastic immunotherapy: Secondary | ICD-10-CM | POA: Diagnosis not present

## 2023-02-02 DIAGNOSIS — Z79899 Other long term (current) drug therapy: Secondary | ICD-10-CM | POA: Diagnosis not present

## 2023-02-02 DIAGNOSIS — Z923 Personal history of irradiation: Secondary | ICD-10-CM | POA: Diagnosis not present

## 2023-02-02 DIAGNOSIS — Z170 Estrogen receptor positive status [ER+]: Secondary | ICD-10-CM | POA: Diagnosis not present

## 2023-02-02 MED ORDER — LETROZOLE 2.5 MG PO TABS: 2.5000 mg | ORAL_TABLET | Freq: Every day | ORAL | 3 refills | Status: DC

## 2023-02-02 MED ORDER — TRASTUZUMAB-HYALURONIDASE-OYSK 600-10000 MG-UNT/5ML ~~LOC~~ SOLN
600.0000 mg | Freq: Once | SUBCUTANEOUS | Status: AC
  Administered 2023-02-02: 600 mg via SUBCUTANEOUS
  Filled 2023-02-02: qty 5

## 2023-02-02 NOTE — Assessment & Plan Note (Signed)
02/12/2022:Screening mammogram detected left breast mass, by ultrasound measured 0.6 cm, biopsy revealed grade 2 IDC ER 100%, PR 2%, HER2 equivocal by IHC and FISH positive, Ki-67 30%    Treatment plan: 1.  03/16/2022 Left lumpectomy: Grade 3 IDC 5 mm (6 mm on biopsy), negative for lymphovascular invasion, ER 100%, PR 2%, HER2 FISH positive, margins negative, 0/2 lymph nodes negative 2.  based on only 6 mm tumor size, we discussed the pros and cons of systemic treatments and decided to treat her with Herceptin with letrozole. 3.  Adjuvant radiation completed 05/26/2022 4.  Followed by adjuvant antiestrogen therapy with letrozole ---------------------------------------------------------------------------- Current treatment: Herceptin started 04/07/2022 with letrozole Herceptin toxicities: Denies any adverse effects   Letrozole toxicities: Denies any hot flashes or arthralgias Bone density December 2023:T-score 0: Normal   partner Crystal is also a patient of mine.  They are both avid tennis players She is going to [place] in the next month to go [place] the [place]. Return to clinic every 3 weeks for Herceptin injections every 6 weeks and follow-up with me.

## 2023-02-02 NOTE — Patient Instructions (Signed)
[facility]  Discharge Instructions: Thank you for choosing [facility] to provide your oncology and hematology care.   If you have a lab appointment with the Cancer Center, please go directly to the Cancer Center and check in at the registration area.   Wear comfortable clothing and clothing appropriate for easy access to any Portacath or PICC line.   We strive to give you quality time with your provider. You may need to reschedule your appointment if you arrive late (15 or more minutes).  Arriving late affects you and other patients whose appointments are after yours.  Also, if you miss three or more appointments without notifying the office, you may be dismissed from the clinic at the provider's discretion.      For prescription refill requests, have your pharmacy contact our office and allow 72 hours for refills to be completed.    Today you received the following chemotherapy and/or immunotherapy agents: Herceptin Hylecta      To help prevent nausea and vomiting after your treatment, we encourage you to take your nausea medication as directed.  BELOW ARE SYMPTOMS THAT SHOULD BE REPORTED IMMEDIATELY: *FEVER GREATER THAN 100.4 F (38 C) OR HIGHER *CHILLS OR SWEATING *NAUSEA AND VOMITING THAT IS NOT CONTROLLED WITH YOUR NAUSEA MEDICATION *UNUSUAL SHORTNESS OF BREATH *UNUSUAL BRUISING OR BLEEDING *URINARY PROBLEMS (pain or burning when urinating, or frequent urination) *BOWEL PROBLEMS (unusual diarrhea, constipation, pain near the anus) TENDERNESS IN MOUTH AND THROAT WITH OR WITHOUT PRESENCE OF ULCERS (sore throat, sores in mouth, or a toothache) UNUSUAL RASH, SWELLING OR PAIN  UNUSUAL VAGINAL DISCHARGE OR ITCHING   Items with * indicate a potential emergency and should be followed up as soon as possible or go to the Emergency Department if any problems should occur.  Please show the CHEMOTHERAPY ALERT CARD or IMMUNOTHERAPY ALERT CARD  at check-in to the Emergency Department and triage nurse.  Should you have questions after your visit or need to cancel or reschedule your appointment, please contact [facility]  Dept: [phone]  and follow the prompts.  Office hours are 8:00 a.m. to 4:30 p.m. Monday - Friday. Please note that voicemails left after 4:00 p.m. may not be returned until the following business day.  We are closed weekends and major holidays. You have access to a nurse at all times for urgent questions. Please call the main number to the clinic Dept: [phone] and follow the prompts.   For any non-urgent questions, you may also contact your provider using MyChart. We now offer e-Visits for anyone 18 and older to request care online for non-urgent symptoms. For details visit [email or web address].   Also download the MyChart app! Go to the app store, search "MyChart", open the app, select [facility], and log in with your MyChart username and password.   

## 2023-02-08 ENCOUNTER — Encounter: Payer: Self-pay | Admitting: Orthopaedic Surgery

## 2023-02-08 ENCOUNTER — Ambulatory Visit: Payer: Medicare Other | Admitting: Orthopaedic Surgery

## 2023-02-08 DIAGNOSIS — M25562 Pain in left knee: Secondary | ICD-10-CM | POA: Diagnosis not present

## 2023-02-08 MED ORDER — LIDOCAINE HCL 1 % IJ SOLN
2.0000 mL | INTRAMUSCULAR | Status: AC | PRN
Start: 2023-02-08 — End: 2023-02-08
  Administered 2023-02-08: 2 mL

## 2023-02-08 MED ORDER — METHYLPREDNISOLONE ACETATE 40 MG/ML IJ SUSP
40.0000 mg | INTRAMUSCULAR | Status: AC | PRN
Start: 2023-02-08 — End: 2023-02-08
  Administered 2023-02-08: 40 mg via INTRA_ARTICULAR

## 2023-02-08 MED ORDER — BUPIVACAINE HCL 0.5 % IJ SOLN
2.0000 mL | INTRAMUSCULAR | Status: AC | PRN
Start: 2023-02-08 — End: 2023-02-08
  Administered 2023-02-08: 2 mL via INTRA_ARTICULAR

## 2023-02-08 NOTE — Progress Notes (Signed)
Office Visit Note   Patient: Julie Pugh           Date of Birth: [birth year]           MRN: [id number] Visit Date: 02/08/2023              Requested by: Sheliah Hatch, MD [address] [place],  [place] [address] PCP: Sheliah Hatch, MD   Assessment & Plan: Visit Diagnoses:  1. Acute pain of left knee     Plan: After further discussion Julie Pugh did recall that she had a fall while she was on her trip therefore I would like to get an MRI to rule out fracture.  In the meantime she did want to try a steroid injection today which she tolerated well.  Follow-up after the MRI.  Follow-Up Instructions: No follow-ups on file.   Orders:  No orders of the defined types were placed in this encounter.  No orders of the defined types were placed in this encounter.     Procedures: Large Joint Inj: L knee on 02/08/2023 9:25 AM Details: 22 G needle Medications: 2 mL bupivacaine 0.5 %; 2 mL lidocaine 1 %; 40 mg methylPREDNISolone acetate 40 MG/ML Outcome: tolerated well, no immediate complications Patient was prepped and draped in the usual sterile fashion.       Clinical Data: No additional findings.   Subjective: No chief complaint on file.   HPI  Julie Pugh returns today for continued left knee pain.  The 2-week course of diclofenac did not improve her symptoms.  Review of Systems   Objective: Vital Signs: There were no vitals taken for this visit.  Physical Exam  Ortho Exam  Examination left knee shows medial joint line tenderness.  Trace effusion.  Specialty Comments:  No specialty comments available.  Imaging: No results found.   PMFS History: Patient Active Problem List   Diagnosis Date Noted   OSA (obstructive sleep apnea) 11/09/2022   Snoring 08/13/2022   Bronchiectasis without complication (HCC) 08/13/2022   HTN (hypertension) 07/04/2022   Physical exam 05/20/2022   Family history of breast cancer 05/05/2022   Family history of  pancreatic cancer 05/05/2022   Family history of prostate cancer 05/05/2022   Elevated blood pressure reading 04/23/2022   Malignant neoplasm of upper-outer quadrant of left breast in female, estrogen receptor positive (HCC) 02/12/2022   History of COVID-19 06/12/2020   Hyperlipidemia 07/10/2015   Hypothyroidism    History of melanoma    Varicose vein    Past Medical History:  Diagnosis Date   Breast cancer (HCC)    Family history of breast cancer    Family history of pancreatic cancer    Family history of prostate cancer    Hx of melanoma of skin    Hyperlipidemia    Hypothyroidism    Dr. Asencion Islam managing. armour thyroid 60mg     Melanoma (HCC)    R shin 2009. sees derm q6 months   Personal history of radiation therapy    Varicose vein     Family History  Problem Relation Age of Onset   Panic disorder Mother    COPD Mother        smoked until 29   Congestive Heart Failure Mother        pacemaker   Hypertension Mother    Heart attack Father        bypass 62, 79 fatal MI, former smoker   Prostate cancer Father 28  Hyperlipidemia Sister    Breast cancer Maternal Aunt    Pancreatic cancer Cousin 84    Past Surgical History:  Procedure Laterality Date   BREAST BIOPSY     benign 2000-stereotactic biopsy   BREAST BIOPSY Right 03/12/2004   BREAST LUMPECTOMY     BREAST LUMPECTOMY WITH RADIOACTIVE SEED AND SENTINEL LYMPH NODE BIOPSY Left 03/16/2022   Procedure: LEFT BREAST LUMPECTOMY WITH RADIOACTIVE SEED AND AXILLARY SENTINEL LYMPH NODE BIOPSY;  Surgeon: Emelia Loron, MD;  Location: [facility];  Service: General;  Laterality: Left;   VARICOSE VEIN SURGERY     2001   Social History   Occupational History   Not on file  Tobacco Use   Smoking status: Never    Passive exposure: Past   Smokeless tobacco: Never  Vaping Use   Vaping Use: Never used  Substance and Sexual Activity   Alcohol use: Yes    Alcohol/week: 4.0 standard drinks of alcohol     Types: 4 Standard drinks or equivalent per week   Drug use: No   Sexual activity: Yes    Birth control/protection: Post-menopausal

## 2023-02-09 ENCOUNTER — Telehealth: Payer: Self-pay | Admitting: Pulmonary Disease

## 2023-02-09 NOTE — Telephone Encounter (Signed)
I am calling on Recalls to sched appts for PFT's and FU appts. This PT's AVS notes stated the following:  We will cancel the breathing test schedule in 4 months and change your follow up appointment to 6 months   Did Dr. Rosalene Billings a PFT at the next FU in "6 months"  or no PFT at all?. Please advise. Thanks.

## 2023-02-11 ENCOUNTER — Ambulatory Visit: Payer: Medicare Other | Admitting: Orthopaedic Surgery

## 2023-02-15 ENCOUNTER — Encounter: Payer: Self-pay | Admitting: Orthopaedic Surgery

## 2023-02-16 ENCOUNTER — Telehealth: Payer: Self-pay | Admitting: Pulmonary Disease

## 2023-02-16 DIAGNOSIS — R0683 Snoring: Secondary | ICD-10-CM

## 2023-02-16 DIAGNOSIS — G4733 Obstructive sleep apnea (adult) (pediatric): Secondary | ICD-10-CM

## 2023-02-16 NOTE — Telephone Encounter (Signed)
Order placed for pt to be referred to Dr. Myrtis Ser for oral appliance. Attempted to call pt to let her know this had been done but unable to reach. Left detailed message for pt letting her know this had been done. Nothing further needed.

## 2023-02-16 NOTE — Telephone Encounter (Signed)
Dr. Craige Cotta, please advise if pt needs to have a PFT in 6 months or if this will just be for an OV?

## 2023-02-16 NOTE — Telephone Encounter (Signed)
Patient would like referral to Dr, Althea Grimmer for oral appliance. Dr. Myrtis Ser fax number is [phone]. Patient phone number is [phone].

## 2023-02-17 NOTE — Telephone Encounter (Signed)
She does not need a PFT. 

## 2023-02-17 NOTE — Telephone Encounter (Signed)
She does not need a PFT.

## 2023-02-18 ENCOUNTER — Encounter (HOSPITAL_BASED_OUTPATIENT_CLINIC_OR_DEPARTMENT_OTHER): Payer: Self-pay

## 2023-02-18 ENCOUNTER — Ambulatory Visit (HOSPITAL_BASED_OUTPATIENT_CLINIC_OR_DEPARTMENT_OTHER)
Admission: RE | Admit: 2023-02-18 | Discharge: 2023-02-18 | Disposition: A | Discharge status: Home / Self Care | Payer: Medicare Other | Source: Ambulatory Visit | Attending: Orthopaedic Surgery | Admitting: Orthopaedic Surgery

## 2023-02-18 DIAGNOSIS — M25562 Pain in left knee: Secondary | ICD-10-CM | POA: Insufficient documentation

## 2023-02-18 HISTORY — DX: Essential (primary) hypertension: I10

## 2023-02-21 ENCOUNTER — Telehealth: Payer: Self-pay | Admitting: Hematology and Oncology

## 2023-02-23 ENCOUNTER — Telehealth: Payer: Self-pay | Admitting: Orthopaedic Surgery

## 2023-02-23 ENCOUNTER — Other Ambulatory Visit: Payer: Medicare Other

## 2023-02-23 ENCOUNTER — Inpatient Hospital Stay: Payer: Medicare Other | Attending: Hematology and Oncology

## 2023-02-23 ENCOUNTER — Telehealth (INDEPENDENT_AMBULATORY_CARE_PROVIDER_SITE_OTHER): Payer: Medicare Other | Admitting: Orthopaedic Surgery

## 2023-02-23 VITALS — BP 145/72 | HR 73 | Temp 98.0°F | Resp 16

## 2023-02-23 DIAGNOSIS — Z923 Personal history of irradiation: Secondary | ICD-10-CM | POA: Insufficient documentation

## 2023-02-23 DIAGNOSIS — S83242A Other tear of medial meniscus, current injury, left knee, initial encounter: Secondary | ICD-10-CM | POA: Insufficient documentation

## 2023-02-23 DIAGNOSIS — Z170 Estrogen receptor positive status [ER+]: Secondary | ICD-10-CM | POA: Diagnosis not present

## 2023-02-23 DIAGNOSIS — Z79811 Long term (current) use of aromatase inhibitors: Secondary | ICD-10-CM | POA: Diagnosis not present

## 2023-02-23 DIAGNOSIS — Z9221 Personal history of antineoplastic chemotherapy: Secondary | ICD-10-CM | POA: Insufficient documentation

## 2023-02-23 DIAGNOSIS — C50412 Malignant neoplasm of upper-outer quadrant of left female breast: Secondary | ICD-10-CM | POA: Diagnosis not present

## 2023-02-23 DIAGNOSIS — Z5112 Encounter for antineoplastic immunotherapy: Secondary | ICD-10-CM | POA: Diagnosis not present

## 2023-02-23 DIAGNOSIS — M25562 Pain in left knee: Secondary | ICD-10-CM

## 2023-02-23 MED ORDER — TRASTUZUMAB-HYALURONIDASE-OYSK 600-10000 MG-UNT/5ML ~~LOC~~ SOLN
600.0000 mg | Freq: Once | SUBCUTANEOUS | Status: AC
  Administered 2023-02-23: 600 mg via SUBCUTANEOUS
  Filled 2023-02-23: qty 5

## 2023-02-23 NOTE — Patient Instructions (Signed)
[facility]  Discharge Instructions: Thank you for choosing [facility] to provide your oncology and hematology care.   If you have a lab appointment with the Cancer Center, please go directly to the Cancer Center and check in at the registration area.   Wear comfortable clothing and clothing appropriate for easy access to any Portacath or PICC line.   We strive to give you quality time with your provider. You may need to reschedule your appointment if you arrive late (15 or more minutes).  Arriving late affects you and other patients whose appointments are after yours.  Also, if you miss three or more appointments without notifying the office, you may be dismissed from the clinic at the provider's discretion.      For prescription refill requests, have your pharmacy contact our office and allow 72 hours for refills to be completed.    Today you received the following chemotherapy and/or immunotherapy agents herceptin hylecta      To help prevent nausea and vomiting after your treatment, we encourage you to take your nausea medication as directed.  BELOW ARE SYMPTOMS THAT SHOULD BE REPORTED IMMEDIATELY: *FEVER GREATER THAN 100.4 F (38 C) OR HIGHER *CHILLS OR SWEATING *NAUSEA AND VOMITING THAT IS NOT CONTROLLED WITH YOUR NAUSEA MEDICATION *UNUSUAL SHORTNESS OF BREATH *UNUSUAL BRUISING OR BLEEDING *URINARY PROBLEMS (pain or burning when urinating, or frequent urination) *BOWEL PROBLEMS (unusual diarrhea, constipation, pain near the anus) TENDERNESS IN MOUTH AND THROAT WITH OR WITHOUT PRESENCE OF ULCERS (sore throat, sores in mouth, or a toothache) UNUSUAL RASH, SWELLING OR PAIN  UNUSUAL VAGINAL DISCHARGE OR ITCHING   Items with * indicate a potential emergency and should be followed up as soon as possible or go to the Emergency Department if any problems should occur.  Please show the CHEMOTHERAPY ALERT CARD or IMMUNOTHERAPY ALERT CARD  at check-in to the Emergency Department and triage nurse.  Should you have questions after your visit or need to cancel or reschedule your appointment, please contact [facility]  Dept: [phone]  and follow the prompts.  Office hours are 8:00 a.m. to 4:30 p.m. Monday - Friday. Please note that voicemails left after 4:00 p.m. may not be returned until the following business day.  We are closed weekends and major holidays. You have access to a nurse at all times for urgent questions. Please call the main number to the clinic Dept: [phone] and follow the prompts.   For any non-urgent questions, you may also contact your provider using MyChart. We now offer e-Visits for anyone 18 and older to request care online for non-urgent symptoms. For details visit [email or web address].   Also download the MyChart app! Go to the app store, search "MyChart", open the app, select , and log in with your MyChart username and password.   

## 2023-02-23 NOTE — Progress Notes (Signed)
I spoke to the patient about her MRI and recommended surgery.  She has elected to move forward with surgery.  I will give you a surgery sheet tomorrow.  The patient did request a copy of the MRI report be emailed to her.  Thank you.

## 2023-02-23 NOTE — Telephone Encounter (Signed)
Pt called requesting MRI results be read over the phone or call with permission while she is at here appt [facility] for them to read results now. Please call pt at [phone]

## 2023-02-23 NOTE — Telephone Encounter (Signed)
I called the patient this afternoon to go over the MRI results.  I explained that the MRI did show a displaced medial meniscus tear that flipped into the inferior gutter.  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  Based on the acute meniscal tear I have recommended arthroscopic partial medial meniscectomy.  We talked about the risks benefits and prognosis of the surgery.  Based on her options she has elected to move forward with a left knee scope.  I will forward her information to my scheduler at this point.  All questions answered.

## 2023-02-23 NOTE — Progress Notes (Deleted)
Office Visit Note   Patient: Julie Pugh           Date of Birth: [birth year]           MRN: [id number] Visit Date: 02/24/2023              Requested by: Sheliah Hatch, MD [address] [place],  [place] [address] PCP: Sheliah Hatch, MD   Assessment & Plan: Visit Diagnoses:  1. Acute medial meniscus tear of left knee, initial encounter     Plan: Impression is 67 year old female with displaced acute medial meniscus tear.  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  Results reviewed with the patient.  Based on the findings and her options, she has elected to ***  Follow-Up Instructions: No follow-ups on file.   Orders:  No orders of the defined types were placed in this encounter.  No orders of the defined types were placed in this encounter.     Procedures: No procedures performed   Clinical Data: No additional findings.   Subjective: No chief complaint on file.   HPI Omari returns today for MRI review.  Denies any changes in symptoms. Review of Systems   Objective: Vital Signs: There were no vitals taken for this visit.  Physical Exam  Ortho Exam Left knee - medial joint line tenderness and pain with [facility] Specialty Comments:  No specialty comments available.  Imaging: No results found.   PMFS History: Patient Active Problem List   Diagnosis Date Noted   Acute medial meniscus tear of left knee 02/23/2023   OSA (obstructive sleep apnea) 11/09/2022   Snoring 08/13/2022   Bronchiectasis without complication (HCC) 08/13/2022   HTN (hypertension) 07/04/2022   Physical exam 05/20/2022   Family history of breast cancer 05/05/2022   Family history of pancreatic cancer 05/05/2022   Family history of prostate cancer 05/05/2022   Elevated blood pressure reading 04/23/2022   Malignant neoplasm of upper-outer quadrant of left breast in female, estrogen receptor positive (HCC) 02/12/2022   History of COVID-19 06/12/2020    Hyperlipidemia 07/10/2015   Hypothyroidism    History of melanoma    Varicose vein    Past Medical History:  Diagnosis Date   Breast cancer (HCC)    Family history of breast cancer    Family history of pancreatic cancer    Family history of prostate cancer    Hx of melanoma of skin    Hyperlipidemia    Hypertension    Hypothyroidism    Dr. Asencion Islam managing. armour thyroid 60mg     Melanoma (HCC)    R shin 2009. sees derm q6 months   Personal history of radiation therapy    Varicose vein     Family History  Problem Relation Age of Onset   Panic disorder Mother    COPD Mother        smoked until 36   Congestive Heart Failure Mother        pacemaker   Hypertension Mother    Heart attack Father        bypass 49, 53 fatal MI, former smoker   Prostate cancer Father 74   Hyperlipidemia Sister    Breast cancer Maternal Aunt    Pancreatic cancer Cousin 16    Past Surgical History:  Procedure Laterality Date   BREAST BIOPSY     benign 2000-stereotactic biopsy   BREAST BIOPSY Right 03/12/2004   BREAST LUMPECTOMY     BREAST LUMPECTOMY  WITH RADIOACTIVE SEED AND SENTINEL LYMPH NODE BIOPSY Left 03/16/2022   Procedure: LEFT BREAST LUMPECTOMY WITH RADIOACTIVE SEED AND AXILLARY SENTINEL LYMPH NODE BIOPSY;  Surgeon: Emelia Loron, MD;  Location: [facility];  Service: General;  Laterality: Left;   VARICOSE VEIN SURGERY     2001   Social History   Occupational History   Not on file  Tobacco Use   Smoking status: Never    Passive exposure: Past   Smokeless tobacco: Never  Vaping Use   Vaping Use: Never used  Substance and Sexual Activity   Alcohol use: Yes    Alcohol/week: 4.0 standard drinks of alcohol    Types: 4 Standard drinks or equivalent per week   Drug use: No   Sexual activity: Yes    Birth control/protection: Post-menopausal

## 2023-02-24 ENCOUNTER — Ambulatory Visit (INDEPENDENT_AMBULATORY_CARE_PROVIDER_SITE_OTHER): Payer: Medicare Other | Admitting: Orthopaedic Surgery

## 2023-02-24 DIAGNOSIS — M25562 Pain in left knee: Secondary | ICD-10-CM

## 2023-02-24 NOTE — Progress Notes (Signed)
Called patient about MRI results. No answer. LMOM that they can be accessed through MyChart, but I would be happy to email them if she calls with an email address.

## 2023-02-25 ENCOUNTER — Inpatient Hospital Stay: Payer: Medicare Other

## 2023-02-25 ENCOUNTER — Inpatient Hospital Stay: Payer: Medicare Other | Admitting: Adult Health

## 2023-02-25 NOTE — Progress Notes (Signed)
Cancelled.  

## 2023-03-01 ENCOUNTER — Ambulatory Visit: Payer: Medicare Other | Admitting: Orthopaedic Surgery

## 2023-03-01 DIAGNOSIS — S83242A Other tear of medial meniscus, current injury, left knee, initial encounter: Secondary | ICD-10-CM | POA: Diagnosis not present

## 2023-03-01 NOTE — Progress Notes (Signed)
Office Visit Note   Patient: Julie Pugh           Date of Birth: 06-10-1956           MRN: [id number] Visit Date: 03/01/2023              Requested by: Sheliah Hatch, MD [address] [place],  [place] [address] PCP: Sheliah Hatch, MD   Assessment & Plan: Visit Diagnoses:  1. Acute medial meniscus tear of left knee, initial encounter     Plan: Impression is 67 year old female with acute left medial meniscal tear.  She has a fairly complex tear with a displaced fragment in the inferior gutter.  She has mild chondromalacia of the femoral-tibial compartments and slightly more advanced in the patellofemoral compartment.  Her symptoms are consistent with the meniscal tear.  Treatment options were explained and questions were answered to their satisfaction.  Plan is for arthroscopic partial medial meniscectomy.  Risk benefits prognosis reviewed with the patient.  Follow-Up Instructions: No follow-ups on file.   Orders:  No orders of the defined types were placed in this encounter.  No orders of the defined types were placed in this encounter.     Procedures: No procedures performed   Clinical Data: No additional findings.   Subjective: Chief Complaint  Patient presents with   Left Knee - Pain    HPI Julie Pugh comes in today with her husband to discuss MRI scan and also to talk about upcoming surgery. Review of Systems  Constitutional: Negative.   HENT: Negative.    Eyes: Negative.   Respiratory: Negative.    Cardiovascular: Negative.   Endocrine: Negative.   Musculoskeletal: Negative.   Neurological: Negative.   Hematological: Negative.   Psychiatric/Behavioral: Negative.    All other systems reviewed and are negative.    Objective: Vital Signs: There were no vitals taken for this visit.  Physical Exam Vitals and nursing note reviewed.  Constitutional:      Appearance: She is well-developed.  HENT:     Head: Normocephalic and atraumatic.   Pulmonary:     Effort: Pulmonary effort is normal.  Abdominal:     Palpations: Abdomen is soft.  Musculoskeletal:     Cervical back: Neck supple.  Skin:    General: Skin is warm.     Capillary Refill: Capillary refill takes less than 2 seconds.  Neurological:     Mental Status: She is alert and oriented to person, place, and time.  Psychiatric:        Behavior: Behavior normal.        Thought Content: Thought content normal.        Judgment: Judgment normal.     Ortho Exam Exam nation of the left knee is unchanged. Specialty Comments:  No specialty comments available.  Imaging: No results found.   PMFS History: Patient Active Problem List   Diagnosis Date Noted   Acute medial meniscus tear of left knee 02/23/2023   OSA (obstructive sleep apnea) 11/09/2022   Snoring 08/13/2022   Bronchiectasis without complication (HCC) 08/13/2022   HTN (hypertension) 07/04/2022   Physical exam 05/20/2022   Family history of breast cancer 05/05/2022   Family history of pancreatic cancer 05/05/2022   Family history of prostate cancer 05/05/2022   Elevated blood pressure reading 04/23/2022   Malignant neoplasm of upper-outer quadrant of left breast in female, estrogen receptor positive (HCC) 02/12/2022   History of COVID-19 06/12/2020   Hyperlipidemia 07/10/2015  Hypothyroidism    History of melanoma    Varicose vein    Past Medical History:  Diagnosis Date   Breast cancer (HCC)    Family history of breast cancer    Family history of pancreatic cancer    Family history of prostate cancer    Hx of melanoma of skin    Hyperlipidemia    Hypertension    Hypothyroidism    Dr. Asencion Islam managing. armour thyroid 60mg     Melanoma (HCC)    R shin 2009. sees derm q6 months   Personal history of radiation therapy    Varicose vein     Family History  Problem Relation Age of Onset   Panic disorder Mother    COPD Mother        smoked until 7   Congestive Heart Failure Mother         pacemaker   Hypertension Mother    Heart attack Father        bypass 35, 32 fatal MI, former smoker   Prostate cancer Father 52   Hyperlipidemia Sister    Breast cancer Maternal Aunt    Pancreatic cancer Cousin 37    Past Surgical History:  Procedure Laterality Date   BREAST BIOPSY     benign 2000-stereotactic biopsy   BREAST BIOPSY Right 03/12/2004   BREAST LUMPECTOMY     BREAST LUMPECTOMY WITH RADIOACTIVE SEED AND SENTINEL LYMPH NODE BIOPSY Left 03/16/2022   Procedure: LEFT BREAST LUMPECTOMY WITH RADIOACTIVE SEED AND AXILLARY SENTINEL LYMPH NODE BIOPSY;  Surgeon: Emelia Loron, MD;  Location: [facility];  Service: General;  Laterality: Left;   VARICOSE VEIN SURGERY     2001   Social History   Occupational History   Not on file  Tobacco Use   Smoking status: Never    Passive exposure: Past   Smokeless tobacco: Never  Vaping Use   Vaping Use: Never used  Substance and Sexual Activity   Alcohol use: Yes    Alcohol/week: 4.0 standard drinks of alcohol    Types: 4 Standard drinks or equivalent per week   Drug use: No   Sexual activity: Yes    Birth control/protection: Post-menopausal

## 2023-03-08 ENCOUNTER — Other Ambulatory Visit: Payer: Self-pay | Admitting: Physician Assistant

## 2023-03-08 MED ORDER — HYDROCODONE-ACETAMINOPHEN 5-325 MG PO TABS: 1.0000 | ORAL_TABLET | Freq: Three times a day (TID) | ORAL | 0 refills | Status: DC | PRN

## 2023-03-08 MED ORDER — ONDANSETRON HCL 4 MG PO TABS: 4.0000 mg | ORAL_TABLET | Freq: Three times a day (TID) | ORAL | 0 refills | Status: DC | PRN

## 2023-03-10 ENCOUNTER — Encounter: Payer: Self-pay | Admitting: Orthopaedic Surgery

## 2023-03-10 DIAGNOSIS — M948X6 Other specified disorders of cartilage, lower leg: Secondary | ICD-10-CM | POA: Diagnosis not present

## 2023-03-10 DIAGNOSIS — S83282A Other tear of lateral meniscus, current injury, left knee, initial encounter: Secondary | ICD-10-CM | POA: Diagnosis not present

## 2023-03-10 DIAGNOSIS — S83242A Other tear of medial meniscus, current injury, left knee, initial encounter: Secondary | ICD-10-CM | POA: Diagnosis not present

## 2023-03-12 HISTORY — PX: MENISCUS REPAIR: SHX5179

## 2023-03-17 ENCOUNTER — Encounter: Payer: Self-pay | Admitting: *Deleted

## 2023-03-17 ENCOUNTER — Ambulatory Visit (INDEPENDENT_AMBULATORY_CARE_PROVIDER_SITE_OTHER): Payer: Medicare Other | Admitting: Physician Assistant

## 2023-03-17 DIAGNOSIS — Z9889 Other specified postprocedural states: Secondary | ICD-10-CM

## 2023-03-17 NOTE — Addendum Note (Signed)
Addended by: Cristie Hem on: 03/17/2023 11:15 AM   Modules accepted: Orders

## 2023-03-17 NOTE — Progress Notes (Signed)
Post-Op Visit Note   Patient: Julie Pugh           Date of Birth: Jun 15, 1956           MRN: [id number] Visit Date: 03/17/2023 PCP: Sheliah Hatch, MD   Assessment & Plan:  Chief Complaint:  Chief Complaint  Patient presents with   Left Knee - Routine Post Op    S/p knee scope 03/10/23   Visit Diagnoses:  1. S/P left knee arthroscopy     Plan: Patient is a pleasant 67 year old female comes in today 1 week status post left knee arthroscopic debridement medial meniscus 03/10/2023.  She has been doing well.  She is not taking anything for the pain.  Examination of the left knee reveals well-healed surgical portals.  She does have slight swelling to the lateral portal.  No evidence of infection or cellulitis.  Calves are soft nontender.  She is neurovascular intact distally.  Today, sutures were removed and Steri-Strips applied.  Intraoperative pictures reviewed.  Home exercise program provided.  Patient would like to attend outpatient physical therapy to have made a an external referral to [facility] on [address].  She will follow-up with [place] in 5 weeks for recheck.  Call with concerns or questions in the meantime.  Follow-Up Instructions: Return in about 5 weeks (around 04/21/2023).   Orders:  No orders of the defined types were placed in this encounter.  No orders of the defined types were placed in this encounter.   Imaging: No new imaging  PMFS History: Patient Active Problem List   Diagnosis Date Noted   Acute medial meniscus tear of left knee 02/23/2023   OSA (obstructive sleep apnea) 11/09/2022   Snoring 08/13/2022   Bronchiectasis without complication (HCC) 08/13/2022   HTN (hypertension) 07/04/2022   Physical exam 05/20/2022   Family history of breast cancer 05/05/2022   Family history of pancreatic cancer 05/05/2022   Family history of prostate cancer 05/05/2022   Elevated blood pressure reading 04/23/2022   Malignant neoplasm of upper-outer quadrant of  left breast in female, estrogen receptor positive (HCC) 02/12/2022   History of COVID-19 06/12/2020   Hyperlipidemia 07/10/2015   Hypothyroidism    History of melanoma    Varicose vein    Past Medical History:  Diagnosis Date   Breast cancer (HCC)    Family history of breast cancer    Family history of pancreatic cancer    Family history of prostate cancer    Hx of melanoma of skin    Hyperlipidemia    Hypertension    Hypothyroidism    Dr. Asencion Islam managing. armour thyroid 60mg     Melanoma (HCC)    R shin 2009. sees derm q6 months   Personal history of radiation therapy    Varicose vein     Family History  Problem Relation Age of Onset   Panic disorder Mother    COPD Mother        smoked until 32   Congestive Heart Failure Mother        pacemaker   Hypertension Mother    Heart attack Father        bypass 52, 41 fatal MI, former smoker   Prostate cancer Father 71   Hyperlipidemia Sister    Breast cancer Maternal Aunt    Pancreatic cancer Cousin 52    Past Surgical History:  Procedure Laterality Date   BREAST BIOPSY     benign 2000-stereotactic biopsy   BREAST BIOPSY Right  03/12/2004   BREAST LUMPECTOMY     BREAST LUMPECTOMY WITH RADIOACTIVE SEED AND SENTINEL LYMPH NODE BIOPSY Left 03/16/2022   Procedure: LEFT BREAST LUMPECTOMY WITH RADIOACTIVE SEED AND AXILLARY SENTINEL LYMPH NODE BIOPSY;  Surgeon: Emelia Loron, MD;  Location: [facility];  Service: General;  Laterality: Left;   VARICOSE VEIN SURGERY     2001   Social History   Occupational History   Not on file  Tobacco Use   Smoking status: Never    Passive exposure: Past   Smokeless tobacco: Never  Vaping Use   Vaping Use: Never used  Substance and Sexual Activity   Alcohol use: Yes    Alcohol/week: 4.0 standard drinks of alcohol    Types: 4 Standard drinks or equivalent per week   Drug use: No   Sexual activity: Yes    Birth control/protection: Post-menopausal

## 2023-03-18 ENCOUNTER — Ambulatory Visit: Payer: Medicare Other

## 2023-03-18 ENCOUNTER — Ambulatory Visit: Payer: Medicare Other | Admitting: Hematology and Oncology

## 2023-03-18 ENCOUNTER — Inpatient Hospital Stay: Payer: Medicare Other | Attending: Hematology and Oncology

## 2023-03-18 VITALS — BP 153/79 | HR 88 | Temp 98.2°F | Resp 16 | Wt 154.8 lb

## 2023-03-18 DIAGNOSIS — Z79811 Long term (current) use of aromatase inhibitors: Secondary | ICD-10-CM | POA: Insufficient documentation

## 2023-03-18 DIAGNOSIS — Z170 Estrogen receptor positive status [ER+]: Secondary | ICD-10-CM | POA: Insufficient documentation

## 2023-03-18 DIAGNOSIS — Z5112 Encounter for antineoplastic immunotherapy: Secondary | ICD-10-CM | POA: Insufficient documentation

## 2023-03-18 DIAGNOSIS — Z923 Personal history of irradiation: Secondary | ICD-10-CM | POA: Diagnosis not present

## 2023-03-18 DIAGNOSIS — C50412 Malignant neoplasm of upper-outer quadrant of left female breast: Secondary | ICD-10-CM | POA: Insufficient documentation

## 2023-03-18 MED ORDER — TRASTUZUMAB-HYALURONIDASE-OYSK 600-10000 MG-UNT/5ML ~~LOC~~ SOLN
600.0000 mg | Freq: Once | SUBCUTANEOUS | Status: AC
  Administered 2023-03-18: 600 mg via SUBCUTANEOUS
  Filled 2023-03-18: qty 5

## 2023-03-18 NOTE — Patient Instructions (Signed)
[facility]  Discharge Instructions: Thank you for choosing [facility] to provide your oncology and hematology care.   If you have a lab appointment with the Cancer Center, please go directly to the Cancer Center and check in at the registration area.   Wear comfortable clothing and clothing appropriate for easy access to any Portacath or PICC line.   We strive to give you quality time with your provider. You may need to reschedule your appointment if you arrive late (15 or more minutes).  Arriving late affects you and other patients whose appointments are after yours.  Also, if you miss three or more appointments without notifying the office, you may be dismissed from the clinic at the provider's discretion.      For prescription refill requests, have your pharmacy contact our office and allow 72 hours for refills to be completed.    Today you received the following chemotherapy and/or immunotherapy agents: Herceptin Hylecta      To help prevent nausea and vomiting after your treatment, we encourage you to take your nausea medication as directed.  BELOW ARE SYMPTOMS THAT SHOULD BE REPORTED IMMEDIATELY: *FEVER GREATER THAN 100.4 F (38 C) OR HIGHER *CHILLS OR SWEATING *NAUSEA AND VOMITING THAT IS NOT CONTROLLED WITH YOUR NAUSEA MEDICATION *UNUSUAL SHORTNESS OF BREATH *UNUSUAL BRUISING OR BLEEDING *URINARY PROBLEMS (pain or burning when urinating, or frequent urination) *BOWEL PROBLEMS (unusual diarrhea, constipation, pain near the anus) TENDERNESS IN MOUTH AND THROAT WITH OR WITHOUT PRESENCE OF ULCERS (sore throat, sores in mouth, or a toothache) UNUSUAL RASH, SWELLING OR PAIN  UNUSUAL VAGINAL DISCHARGE OR ITCHING   Items with * indicate a potential emergency and should be followed up as soon as possible or go to the Emergency Department if any problems should occur.  Please show the CHEMOTHERAPY ALERT CARD or IMMUNOTHERAPY ALERT CARD  at check-in to the Emergency Department and triage nurse.  Should you have questions after your visit or need to cancel or reschedule your appointment, please contact [facility]  Dept: [phone]  and follow the prompts.  Office hours are 8:00 a.m. to 4:30 p.m. Monday - Friday. Please note that voicemails left after 4:00 p.m. may not be returned until the following business day.  We are closed weekends and major holidays. You have access to a nurse at all times for urgent questions. Please call the main number to the clinic Dept: [phone] and follow the prompts.   For any non-urgent questions, you may also contact your provider using MyChart. We now offer e-Visits for anyone 18 and older to request care online for non-urgent symptoms. For details visit [email or web address].   Also download the MyChart app! Go to the app store, search "MyChart", open the app, select [facility], and log in with your MyChart username and password.   

## 2023-03-23 DIAGNOSIS — M25662 Stiffness of left knee, not elsewhere classified: Secondary | ICD-10-CM | POA: Diagnosis not present

## 2023-03-23 DIAGNOSIS — Z4889 Encounter for other specified surgical aftercare: Secondary | ICD-10-CM | POA: Diagnosis not present

## 2023-03-23 DIAGNOSIS — M2342 Loose body in knee, left knee: Secondary | ICD-10-CM | POA: Diagnosis not present

## 2023-03-23 DIAGNOSIS — R262 Difficulty in walking, not elsewhere classified: Secondary | ICD-10-CM | POA: Diagnosis not present

## 2023-03-25 DIAGNOSIS — Z4889 Encounter for other specified surgical aftercare: Secondary | ICD-10-CM | POA: Diagnosis not present

## 2023-03-25 DIAGNOSIS — M2342 Loose body in knee, left knee: Secondary | ICD-10-CM | POA: Diagnosis not present

## 2023-03-25 DIAGNOSIS — M25662 Stiffness of left knee, not elsewhere classified: Secondary | ICD-10-CM | POA: Diagnosis not present

## 2023-03-25 DIAGNOSIS — R262 Difficulty in walking, not elsewhere classified: Secondary | ICD-10-CM | POA: Diagnosis not present

## 2023-03-30 DIAGNOSIS — M2342 Loose body in knee, left knee: Secondary | ICD-10-CM | POA: Diagnosis not present

## 2023-03-30 DIAGNOSIS — Z4889 Encounter for other specified surgical aftercare: Secondary | ICD-10-CM | POA: Diagnosis not present

## 2023-03-30 DIAGNOSIS — M25662 Stiffness of left knee, not elsewhere classified: Secondary | ICD-10-CM | POA: Diagnosis not present

## 2023-03-30 DIAGNOSIS — R262 Difficulty in walking, not elsewhere classified: Secondary | ICD-10-CM | POA: Diagnosis not present

## 2023-04-01 DIAGNOSIS — R262 Difficulty in walking, not elsewhere classified: Secondary | ICD-10-CM | POA: Diagnosis not present

## 2023-04-01 DIAGNOSIS — M25662 Stiffness of left knee, not elsewhere classified: Secondary | ICD-10-CM | POA: Diagnosis not present

## 2023-04-01 DIAGNOSIS — Z4889 Encounter for other specified surgical aftercare: Secondary | ICD-10-CM | POA: Diagnosis not present

## 2023-04-01 DIAGNOSIS — M2342 Loose body in knee, left knee: Secondary | ICD-10-CM | POA: Diagnosis not present

## 2023-04-03 NOTE — Progress Notes (Signed)
Patient Care Team: Julie Hatch, MD as PCP - General (Family Medicine) Carlus Pavlov, MD as Consulting Physician (Internal Medicine) Serena Croissant, MD as Medical Oncologist (Hematology and Oncology)  DIAGNOSIS:  Encounter Diagnosis  Name Primary?   Malignant neoplasm of upper-outer quadrant of left breast in female, estrogen receptor positive (HCC) Yes    SUMMARY OF ONCOLOGIC HISTORY: Oncology History  Malignant neoplasm of upper-outer quadrant of left breast in female, estrogen receptor positive (HCC)  02/12/2022 Initial Diagnosis   Screening mammogram detected left breast mass, by ultrasound measured 0.6 cm, biopsy revealed grade 2 IDC ER 100%, PR 2%, HER2 equivocal by IHC and FISH positive, Ki-67 30%   02/17/2022 Cancer Staging   Staging form: Breast, AJCC 8th Edition - Clinical: Stage IA (cT1b, cN0, cM0, G2, ER+, PR-, HER2+) - Signed by Serena Croissant, MD on 02/17/2022 Stage prefix: Initial diagnosis Histologic grading system: 3 grade system   03/16/2022 Surgery   Left lumpectomy: Grade 3 IDC 5 mm, negative for lymphovascular invasion, ER 100%, PR 2%, HER2 FISH positive, margins negative, 0/2 lymph nodes negative   03/29/2022 -  Anti-estrogen oral therapy   Letrozole daily   04/07/2022 - 05/19/2022 Chemotherapy   Patient is on Treatment Plan : BREAST Trastuzumab q21d x 13 cycles     04/07/2022 - 12/01/2022 Chemotherapy   Patient is on Treatment Plan : BREAST Trastuzumab IV (8/6) or SQ (600) D1 q21d     04/29/2022 - 05/26/2022 Radiation Therapy   Adjuvant Radiation therapy   05/05/2022 Genetic Testing   Referral pending   05/09/2022 Cancer Staging   Staging form: Breast, AJCC 8th Edition - Pathologic: Stage IA (pT1a, pN0, cM0, G3, ER+, PR-, HER2+) - Signed by Loa Socks, NP on 05/09/2022 Histologic grading system: 3 grade system   05/13/2022 Genetic Testing   Negative genetic testing on the CancerNext-Expanded+RNAinsight panel.  The report date is May 13, 2022.  The CancerNext-Expanded gene panel offered by [facility] and includes sequencing and rearrangement analysis for the following 77 genes: AIP, ALK, APC*, ATM*, AXIN2, BAP1, BARD1, BLM, BMPR1A, BRCA1*, BRCA2*, BRIP1*, CDC73, CDH1*, CDK4, CDKN1B, CDKN2A, CHEK2*, CTNNA1, DICER1, FANCC, FH, FLCN, GALNT12, KIF1B, LZTR1, MAX, MEN1, MET, MLH1*, MSH2*, MSH3, MSH6*, MUTYH*, NBN, NF1*, NF2, NTHL1, PALB2*, PHOX2B, PMS2*, POT1, PRKAR1A, PTCH1, PTEN*, RAD51C*, RAD51D*, RB1, RECQL, RET, SDHA, SDHAF2, SDHB, SDHC, SDHD, SMAD4, SMARCA4, SMARCB1, SMARCE1, STK11, SUFU, TMEM127, TP53*, TSC1, TSC2, VHL and XRCC2 (sequencing and deletion/duplication); EGFR, EGLN1, HOXB13, KIT, MITF, PDGFRA, POLD1, and POLE (sequencing only); EPCAM and GREM1 (deletion/duplication only). DNA and RNA analyses performed for * genes.    12/24/2022 -  Chemotherapy   Patient is on Treatment Plan : BREAST MAINTENANCE Trastuzumab IV (6) or SQ (600) D1 q21d x 13 cycles       CHIEF COMPLIANT: Herceptin and letrozole follow-up   INTERVAL HISTORY: Erik Mesaros is a 67 y.o with the above mentioned left lumpectomy on herceptin and letrozole. She presents to the clinic today for a follow-up. She reports left leg is doing better. Denies any pain. She does have foggy brain. She is tolerating the letrozole extremely well.   ALLERGIES:  has No Known Allergies.  MEDICATIONS:  Current Outpatient Medications  Medication Sig Dispense Refill   HYDROcodone-acetaminophen (NORCO) 5-325 MG tablet Take 1 tablet by mouth 3 (three) times daily as needed. 20 tablet 0   ondansetron (ZOFRAN) 4 MG tablet Take 1 tablet (4 mg total) by mouth every 8 (eight) hours as needed for nausea or  vomiting. 40 tablet 0   amLODipine (NORVASC) 2.5 MG tablet Take 1 tablet (2.5 mg total) by mouth daily. 90 tablet 1   Bioflavonoid Products (ESTER-C) 500-200-60 MG TABS Take by mouth.     Cholecalciferol (VITAMIN D) 2000 units tablet Take 5,000 Units by mouth daily.      diclofenac (VOLTAREN) 75 MG EC tablet Take 1 tablet (75 mg total) by mouth 2 (two) times daily. 30 tablet 2   diphenhydramine-acetaminophen (TYLENOL PM) 25-500 MG TABS tablet Take 1 tablet by mouth at bedtime as needed.     letrozole (FEMARA) 2.5 MG tablet Take 1 tablet (2.5 mg total) by mouth daily. 90 tablet 3   levothyroxine (SYNTHROID) 100 MCG tablet TAKE 1 TABLET(100 MCG) BY MOUTH DAILY 90 tablet 3   METRONIDAZOLE, TOPICAL, 0.75 % LOTN      No current facility-administered medications for this visit.    PHYSICAL EXAMINATION: ECOG PERFORMANCE STATUS: 1 - Symptomatic but completely ambulatory  Vitals:   04/08/23 1138  BP: 138/77  Pulse: 65  Resp: 18  Temp: 98.4 F (36.9 C)  SpO2: 99%   Filed Weights   04/08/23 1138  Weight: 153 lb 1.6 oz (69.4 kg)      LABORATORY DATA:  I have reviewed the data as listed    Latest Ref Rng & Units 12/03/2022    2:09 PM 10/20/2022    9:01 AM 10/22/2021    1:21 PM  CMP  Glucose 65 - 99 mg/dL 84  85  87   BUN 7 - 25 mg/dL 23  19  20    Creatinine 0.50 - 1.05 mg/dL 1.61  0.96  0.45   Sodium 135 - 146 mmol/L 140  139  140   Potassium 3.5 - 5.3 mmol/L 4.5  4.0  4.2   Chloride 98 - 110 mmol/L 105  107  105   CO2 20 - 32 mmol/L 22  29  29    Calcium 8.6 - 10.4 mg/dL 9.3  9.1  9.4   Total Protein 6.1 - 8.1 g/dL 6.8  6.8  7.2   Total Bilirubin 0.2 - 1.2 mg/dL 0.5  0.6  0.5   Alkaline Phos 38 - 126 U/L  63  60   AST 10 - 35 U/L 16  17  20    ALT 6 - 29 U/L 15  18  17      Lab Results  Component Value Date   WBC 5.0 12/03/2022   HGB 13.3 12/03/2022   HCT 37.7 12/03/2022   MCV 85.7 12/03/2022   PLT 256 12/03/2022   NEUTROABS 2,030 12/03/2022    ASSESSMENT & PLAN:  Malignant neoplasm of upper-outer quadrant of left breast in female, estrogen receptor positive (HCC) 02/12/2022:Screening mammogram detected left breast mass, by ultrasound measured 0.6 cm, biopsy revealed grade 2 IDC ER 100%, PR 2%, HER2 equivocal by IHC and FISH positive, Ki-67  30%    Treatment plan: 1.  03/16/2022 Left lumpectomy: Grade 3 IDC 5 mm (6 mm on biopsy), negative for lymphovascular invasion, ER 100%, PR 2%, HER2 FISH positive, margins negative, 0/2 lymph nodes negative 2.  based on only 6 mm tumor size, we discussed the pros and cons of systemic treatments and decided to treat her with Herceptin with letrozole. 3.  Adjuvant radiation completed 05/26/2022 4.  Followed by adjuvant antiestrogen therapy with letrozole ---------------------------------------------------------------------------- Current treatment: Herceptin started 04/07/2022 with letrozole, today is her last dose of Herceptin Herceptin toxicities: Denies any adverse effects  Letrozole toxicities: Denies any  hot flashes or arthralgias  Bone density December 2023:T-score 0: Normal partner Aggie Cosier is also a patient of mine.  They are both avid tennis players   Knee pain: Resolved after minor surgery on the knee We will set her up for Signatera testing Return to clinic in 1 year for follow-up    No orders of the defined types were placed in this encounter.  The patient has a good understanding of the overall plan. she agrees with it. she will call with any problems that may develop before the next visit here. Total time spent: 30 mins including face to face time and time spent for planning, charting and co-ordination of care   Tamsen Meek, MD 04/08/23    I Janan Ridge am acting as a Neurosurgeon for [employer]  I have reviewed the above documentation for accuracy and completeness, and I agree with the above.

## 2023-04-05 DIAGNOSIS — Z4889 Encounter for other specified surgical aftercare: Secondary | ICD-10-CM | POA: Diagnosis not present

## 2023-04-05 DIAGNOSIS — M2342 Loose body in knee, left knee: Secondary | ICD-10-CM | POA: Diagnosis not present

## 2023-04-05 DIAGNOSIS — M25662 Stiffness of left knee, not elsewhere classified: Secondary | ICD-10-CM | POA: Diagnosis not present

## 2023-04-05 DIAGNOSIS — R262 Difficulty in walking, not elsewhere classified: Secondary | ICD-10-CM | POA: Diagnosis not present

## 2023-04-07 DIAGNOSIS — M25662 Stiffness of left knee, not elsewhere classified: Secondary | ICD-10-CM | POA: Diagnosis not present

## 2023-04-07 DIAGNOSIS — R262 Difficulty in walking, not elsewhere classified: Secondary | ICD-10-CM | POA: Diagnosis not present

## 2023-04-07 DIAGNOSIS — Z4889 Encounter for other specified surgical aftercare: Secondary | ICD-10-CM | POA: Diagnosis not present

## 2023-04-07 DIAGNOSIS — M2342 Loose body in knee, left knee: Secondary | ICD-10-CM | POA: Diagnosis not present

## 2023-04-08 ENCOUNTER — Telehealth: Payer: Self-pay

## 2023-04-08 ENCOUNTER — Inpatient Hospital Stay: Payer: Medicare Other | Admitting: Hematology and Oncology

## 2023-04-08 ENCOUNTER — Inpatient Hospital Stay: Payer: Medicare Other

## 2023-04-08 ENCOUNTER — Other Ambulatory Visit: Payer: Self-pay

## 2023-04-08 VITALS — BP 138/77 | HR 65 | Temp 98.4°F | Resp 18 | Ht 67.0 in | Wt 153.1 lb

## 2023-04-08 DIAGNOSIS — C50412 Malignant neoplasm of upper-outer quadrant of left female breast: Secondary | ICD-10-CM

## 2023-04-08 DIAGNOSIS — Z923 Personal history of irradiation: Secondary | ICD-10-CM | POA: Diagnosis not present

## 2023-04-08 DIAGNOSIS — Z5112 Encounter for antineoplastic immunotherapy: Secondary | ICD-10-CM | POA: Diagnosis not present

## 2023-04-08 DIAGNOSIS — Z170 Estrogen receptor positive status [ER+]: Secondary | ICD-10-CM | POA: Diagnosis not present

## 2023-04-08 DIAGNOSIS — Z79811 Long term (current) use of aromatase inhibitors: Secondary | ICD-10-CM | POA: Diagnosis not present

## 2023-04-08 MED ORDER — TRASTUZUMAB-HYALURONIDASE-OYSK 600-10000 MG-UNT/5ML ~~LOC~~ SOLN
600.0000 mg | Freq: Once | SUBCUTANEOUS | Status: AC
  Administered 2023-04-08: 600 mg via SUBCUTANEOUS
  Filled 2023-04-08: qty 5

## 2023-04-08 NOTE — Assessment & Plan Note (Addendum)
02/12/2022:Screening mammogram detected left breast mass, by ultrasound measured 0.6 cm, biopsy revealed grade 2 IDC ER 100%, PR 2%, HER2 equivocal by IHC and FISH positive, Ki-67 30%    Treatment plan: 1.  03/16/2022 Left lumpectomy: Grade 3 IDC 5 mm (6 mm on biopsy), negative for lymphovascular invasion, ER 100%, PR 2%, HER2 FISH positive, margins negative, 0/2 lymph nodes negative 2.  based on only 6 mm tumor size, we discussed the pros and cons of systemic treatments and decided to treat her with Herceptin with letrozole. 3.  Adjuvant radiation completed 05/26/2022 4.  Followed by adjuvant antiestrogen therapy with letrozole ---------------------------------------------------------------------------- Current treatment: Herceptin started 04/07/2022 with letrozole, today is her last dose of Herceptin Herceptin toxicities: Denies any adverse effects  Letrozole toxicities: Denies any hot flashes or arthralgias  Bone density December 2023:T-score 0: Normal partner Crystal is also a patient of mine.  They are both avid tennis players   Knee pain: Resolved after minor surgery on the knee We will set her up for Signatera testing Return to clinic in 1 year for follow-up

## 2023-04-08 NOTE — Telephone Encounter (Signed)
Per md orders entered for signatera. Requisition and all supported documents faxed to [phone]. Faxed confirmation was received.  

## 2023-04-08 NOTE — Patient Instructions (Signed)
[facility]  Discharge Instructions: Thank you for choosing [facility] to provide your oncology and hematology care.   If you have a lab appointment with the Cancer Center, please go directly to the Cancer Center and check in at the registration area.   Wear comfortable clothing and clothing appropriate for easy access to any Portacath or PICC line.   We strive to give you quality time with your provider. You may need to reschedule your appointment if you arrive late (15 or more minutes).  Arriving late affects you and other patients whose appointments are after yours.  Also, if you miss three or more appointments without notifying the office, you may be dismissed from the clinic at the provider's discretion.      For prescription refill requests, have your pharmacy contact our office and allow 72 hours for refills to be completed.    Today you received the following chemotherapy and/or immunotherapy agents: Herceptin Hylecta      To help prevent nausea and vomiting after your treatment, we encourage you to take your nausea medication as directed.  BELOW ARE SYMPTOMS THAT SHOULD BE REPORTED IMMEDIATELY: *FEVER GREATER THAN 100.4 F (38 C) OR HIGHER *CHILLS OR SWEATING *NAUSEA AND VOMITING THAT IS NOT CONTROLLED WITH YOUR NAUSEA MEDICATION *UNUSUAL SHORTNESS OF BREATH *UNUSUAL BRUISING OR BLEEDING *URINARY PROBLEMS (pain or burning when urinating, or frequent urination) *BOWEL PROBLEMS (unusual diarrhea, constipation, pain near the anus) TENDERNESS IN MOUTH AND THROAT WITH OR WITHOUT PRESENCE OF ULCERS (sore throat, sores in mouth, or a toothache) UNUSUAL RASH, SWELLING OR PAIN  UNUSUAL VAGINAL DISCHARGE OR ITCHING   Items with * indicate a potential emergency and should be followed up as soon as possible or go to the Emergency Department if any problems should occur.  Please show the CHEMOTHERAPY ALERT CARD or IMMUNOTHERAPY ALERT CARD  at check-in to the Emergency Department and triage nurse.  Should you have questions after your visit or need to cancel or reschedule your appointment, please contact [facility]  Dept: [phone]  and follow the prompts.  Office hours are 8:00 a.m. to 4:30 p.m. Monday - Friday. Please note that voicemails left after 4:00 p.m. may not be returned until the following business day.  We are closed weekends and major holidays. You have access to a nurse at all times for urgent questions. Please call the main number to the clinic Dept: [phone] and follow the prompts.   For any non-urgent questions, you may also contact your provider using MyChart. We now offer e-Visits for anyone 18 and older to request care online for non-urgent symptoms. For details visit [email or web address].   Also download the MyChart app! Go to the app store, search "MyChart", open the app, select [facility], and log in with your MyChart username and password.   

## 2023-04-12 DIAGNOSIS — Z4889 Encounter for other specified surgical aftercare: Secondary | ICD-10-CM | POA: Diagnosis not present

## 2023-04-12 DIAGNOSIS — M25662 Stiffness of left knee, not elsewhere classified: Secondary | ICD-10-CM | POA: Diagnosis not present

## 2023-04-12 DIAGNOSIS — R262 Difficulty in walking, not elsewhere classified: Secondary | ICD-10-CM | POA: Diagnosis not present

## 2023-04-12 DIAGNOSIS — M2342 Loose body in knee, left knee: Secondary | ICD-10-CM | POA: Diagnosis not present

## 2023-04-15 ENCOUNTER — Encounter: Payer: Self-pay | Admitting: *Deleted

## 2023-04-15 DIAGNOSIS — C50412 Malignant neoplasm of upper-outer quadrant of left female breast: Secondary | ICD-10-CM

## 2023-04-19 DIAGNOSIS — M2342 Loose body in knee, left knee: Secondary | ICD-10-CM | POA: Diagnosis not present

## 2023-04-19 DIAGNOSIS — R262 Difficulty in walking, not elsewhere classified: Secondary | ICD-10-CM | POA: Diagnosis not present

## 2023-04-19 DIAGNOSIS — M25662 Stiffness of left knee, not elsewhere classified: Secondary | ICD-10-CM | POA: Diagnosis not present

## 2023-04-19 DIAGNOSIS — Z4889 Encounter for other specified surgical aftercare: Secondary | ICD-10-CM | POA: Diagnosis not present

## 2023-04-21 ENCOUNTER — Encounter: Payer: Self-pay | Admitting: Physician Assistant

## 2023-04-21 ENCOUNTER — Ambulatory Visit (INDEPENDENT_AMBULATORY_CARE_PROVIDER_SITE_OTHER): Payer: Medicare Other | Admitting: Physician Assistant

## 2023-04-21 DIAGNOSIS — Z9889 Other specified postprocedural states: Secondary | ICD-10-CM

## 2023-04-21 NOTE — Progress Notes (Signed)
Post-Op Visit Note   Patient: Julie Pugh           Date of Birth: 29-Jul-1956           MRN: [id number] Visit Date: 04/21/2023 PCP: Sheliah Hatch, MD   Assessment & Plan:  Chief Complaint:  Chief Complaint  Patient presents with   Left Knee - Follow-up    Left knee scope 03/10/2023   Visit Diagnoses:  1. S/P left knee arthroscopy     Plan: Patient is a very pleasant 67 year old female who comes in today 6 weeks status post left knee arthroscopic debridement medial meniscus 03/10/2023.  She has been doing well.  No complaints.  Examination of the left knee reveals no effusion.  Range of motion 0 to 130 degrees.  She is neurovascular intact distally.  At this point, she will continue to increase activity as tolerated.  She will follow-up with [place] as needed.  Call with concerns or questions.  Follow-Up Instructions: Return if symptoms worsen or fail to improve.   Orders:  No orders of the defined types were placed in this encounter.  No orders of the defined types were placed in this encounter.   Imaging: No results found.  PMFS History: Patient Active Problem List   Diagnosis Date Noted   Acute medial meniscus tear of left knee 02/23/2023   OSA (obstructive sleep apnea) 11/09/2022   Snoring 08/13/2022   Bronchiectasis without complication (HCC) 08/13/2022   HTN (hypertension) 07/04/2022   Physical exam 05/20/2022   Family history of breast cancer 05/05/2022   Family history of pancreatic cancer 05/05/2022   Family history of prostate cancer 05/05/2022   Elevated blood pressure reading 04/23/2022   Malignant neoplasm of upper-outer quadrant of left breast in female, estrogen receptor positive (HCC) 02/12/2022   History of COVID-19 06/12/2020   Hyperlipidemia 07/10/2015   Hypothyroidism    History of melanoma    Varicose vein    Past Medical History:  Diagnosis Date   Breast cancer (HCC)    Family history of breast cancer    Family history of pancreatic  cancer    Family history of prostate cancer    Hx of melanoma of skin    Hyperlipidemia    Hypertension    Hypothyroidism    Dr. Asencion Islam managing. armour thyroid 60mg     Melanoma (HCC)    R shin 2009. sees derm q6 months   Personal history of radiation therapy    Varicose vein     Family History  Problem Relation Age of Onset   Panic disorder Mother    COPD Mother        smoked until 32   Congestive Heart Failure Mother        pacemaker   Hypertension Mother    Heart attack Father        bypass 9, 64 fatal MI, former smoker   Prostate cancer Father 66   Hyperlipidemia Sister    Breast cancer Maternal Aunt    Pancreatic cancer Cousin 93    Past Surgical History:  Procedure Laterality Date   BREAST BIOPSY     benign 2000-stereotactic biopsy   BREAST BIOPSY Right 03/12/2004   BREAST LUMPECTOMY     BREAST LUMPECTOMY WITH RADIOACTIVE SEED AND SENTINEL LYMPH NODE BIOPSY Left 03/16/2022   Procedure: LEFT BREAST LUMPECTOMY WITH RADIOACTIVE SEED AND AXILLARY SENTINEL LYMPH NODE BIOPSY;  Surgeon: Emelia Loron, MD;  Location: [facility];  Service: General;  Laterality:  Left;   VARICOSE VEIN SURGERY     2001   Social History   Occupational History   Not on file  Tobacco Use   Smoking status: Never    Passive exposure: Past   Smokeless tobacco: Never  Vaping Use   Vaping status: Never Used  Substance and Sexual Activity   Alcohol use: Yes    Alcohol/week: 4.0 standard drinks of alcohol    Types: 4 Standard drinks or equivalent per week   Drug use: No   Sexual activity: Yes    Birth control/protection: Post-menopausal

## 2023-04-25 DIAGNOSIS — Z170 Estrogen receptor positive status [ER+]: Secondary | ICD-10-CM | POA: Diagnosis not present

## 2023-04-25 DIAGNOSIS — C50412 Malignant neoplasm of upper-outer quadrant of left female breast: Secondary | ICD-10-CM | POA: Diagnosis not present

## 2023-05-02 ENCOUNTER — Ambulatory Visit: Payer: Medicare Other | Admitting: Rehabilitation

## 2023-05-06 ENCOUNTER — Other Ambulatory Visit: Payer: Self-pay | Admitting: Family Medicine

## 2023-05-06 DIAGNOSIS — R030 Elevated blood-pressure reading, without diagnosis of hypertension: Secondary | ICD-10-CM

## 2023-05-09 ENCOUNTER — Ambulatory Visit: Payer: Medicare Other | Attending: General Surgery | Admitting: Physical Therapy

## 2023-05-09 ENCOUNTER — Encounter: Payer: Self-pay | Admitting: Physical Therapy

## 2023-05-09 DIAGNOSIS — Z483 Aftercare following surgery for neoplasm: Secondary | ICD-10-CM | POA: Insufficient documentation

## 2023-05-09 NOTE — Therapy (Signed)
OUTPATIENT PHYSICAL THERAPY SOZO SCREENING NOTE   Patient Name: Julie Pugh MRN: [id number] DOB:May 16, 1956, 67 y.o., female Today's Date: 05/09/2023  PCP: Sheliah Hatch, MD REFERRING PROVIDER: Emelia Loron, MD   PT End of Session - 05/09/23 1232     Visit Number 2    PT Start Time 1205    PT Stop Time 1215    PT Time Calculation (min) 10 min    Activity Tolerance Patient tolerated treatment well    Behavior During Therapy [facility] for tasks assessed/performed             Past Medical History:  Diagnosis Date   Breast cancer (HCC)    Family history of breast cancer    Family history of pancreatic cancer    Family history of prostate cancer    Hx of melanoma of skin    Hyperlipidemia    Hypertension    Hypothyroidism    Dr. Asencion Islam managing. armour thyroid 60mg     Melanoma (HCC)    R shin 2009. sees derm q6 months   Personal history of radiation therapy    Varicose vein    Past Surgical History:  Procedure Laterality Date   BREAST BIOPSY     benign 2000-stereotactic biopsy   BREAST BIOPSY Right 03/12/2004   BREAST LUMPECTOMY     BREAST LUMPECTOMY WITH RADIOACTIVE SEED AND SENTINEL LYMPH NODE BIOPSY Left 03/16/2022   Procedure: LEFT BREAST LUMPECTOMY WITH RADIOACTIVE SEED AND AXILLARY SENTINEL LYMPH NODE BIOPSY;  Surgeon: Emelia Loron, MD;  Location: [facility];  Service: General;  Laterality: Left;   VARICOSE VEIN SURGERY     2001   Patient Active Problem List   Diagnosis Date Noted   Acute medial meniscus tear of left knee 02/23/2023   OSA (obstructive sleep apnea) 11/09/2022   Snoring 08/13/2022   Bronchiectasis without complication (HCC) 08/13/2022   HTN (hypertension) 07/04/2022   Physical exam 05/20/2022   Family history of breast cancer 05/05/2022   Family history of pancreatic cancer 05/05/2022   Family history of prostate cancer 05/05/2022   Elevated blood pressure reading 04/23/2022   Malignant neoplasm of  upper-outer quadrant of left breast in female, estrogen receptor positive (HCC) 02/12/2022   History of COVID-19 06/12/2020   Hyperlipidemia 07/10/2015   Hypothyroidism    History of melanoma    Varicose vein     REFERRING DIAG: right breast cancer at risk for lymphedema  THERAPY DIAG:  Aftercare following surgery for neoplasm  PERTINENT HISTORY: Patient was diagnosed on 02/08/22 with left grade 2. It measures less than 1 cm and is located in the upper outer quadrant. It is ER/PR+ (HER2 equivocal - waiting on results) with a Ki67 of 30%. L Lumpectomy and SLNB (0/2) on 03/16/22   PRECAUTIONS: right UE Lymphedema risk  SUBJECTIVE: Here for SOZO screen  PAIN:  Are you having pain? No  SOZO SCREENING: Patient was assessed today using the SOZO machine to determine the lymphedema index score. This was compared to her baseline score. It was determined that she is within the recommended range when compared to her baseline and no further action is needed at this time. She will continue SOZO screenings. These are done every 3 months for 2 years post operatively followed by every 6 months for 2 years, and then annually.   L-DEX FLOWSHEETS - 05/09/23 1200       L-DEX LYMPHEDEMA SCREENING   Measurement Type Unilateral    L-DEX MEASUREMENT EXTREMITY Upper Extremity  POSITION  Standing    DOMINANT SIDE Right    At Risk Side Left    BASELINE SCORE (UNILATERAL) -2.7    L-DEX SCORE (UNILATERAL) -3.1    VALUE CHANGE (UNILAT) -0.4             Bethann Punches, [place] 05/09/23 12:34 PM

## 2023-05-10 ENCOUNTER — Other Ambulatory Visit: Payer: Self-pay

## 2023-05-11 ENCOUNTER — Encounter (INDEPENDENT_AMBULATORY_CARE_PROVIDER_SITE_OTHER): Payer: Self-pay

## 2023-05-20 DIAGNOSIS — C50412 Malignant neoplasm of upper-outer quadrant of left female breast: Secondary | ICD-10-CM | POA: Diagnosis not present

## 2023-05-20 DIAGNOSIS — Z170 Estrogen receptor positive status [ER+]: Secondary | ICD-10-CM | POA: Diagnosis not present

## 2023-05-30 ENCOUNTER — Telehealth: Payer: Self-pay

## 2023-05-30 NOTE — Telephone Encounter (Signed)
Called pt per MD to advise [employer] testing was negative/not detected. LVM for pt to return call to receive results. [facility] will be in touch to schedule 3 mo repeat lab.

## 2023-05-30 NOTE — Telephone Encounter (Signed)
Pt returned call and was given results per below encounter.

## 2023-05-31 ENCOUNTER — Encounter (HOSPITAL_COMMUNITY): Payer: Self-pay

## 2023-06-01 ENCOUNTER — Ambulatory Visit: Payer: Medicare Other | Admitting: Family Medicine

## 2023-06-08 ENCOUNTER — Ambulatory Visit (INDEPENDENT_AMBULATORY_CARE_PROVIDER_SITE_OTHER): Payer: Medicare Other | Admitting: Family Medicine

## 2023-06-08 ENCOUNTER — Encounter: Payer: Self-pay | Admitting: Family Medicine

## 2023-06-08 VITALS — BP 118/78 | HR 69 | Temp 98.2°F | Resp 17 | Ht 67.0 in | Wt 157.0 lb

## 2023-06-08 DIAGNOSIS — E039 Hypothyroidism, unspecified: Secondary | ICD-10-CM

## 2023-06-08 DIAGNOSIS — E785 Hyperlipidemia, unspecified: Secondary | ICD-10-CM | POA: Diagnosis not present

## 2023-06-08 DIAGNOSIS — I10 Essential (primary) hypertension: Secondary | ICD-10-CM | POA: Diagnosis not present

## 2023-06-08 LAB — BASIC METABOLIC PANEL WITH GFR
BUN: 26 mg/dL — ABNORMAL HIGH (ref 6–23)
CO2: 29 meq/L (ref 19–32)
Calcium: 9.3 mg/dL (ref 8.4–10.5)
Chloride: 105 meq/L (ref 96–112)
Creatinine, Ser: 0.89 mg/dL (ref 0.40–1.20)
GFR: 66.17 mL/min
Glucose, Bld: 92 mg/dL (ref 70–99)
Potassium: 4.6 meq/L (ref 3.5–5.1)
Sodium: 140 meq/L (ref 135–145)

## 2023-06-08 LAB — CBC WITH DIFFERENTIAL/PLATELET
Basophils Absolute: 0 K/uL (ref 0.0–0.1)
Basophils Relative: 0.6 % (ref 0.0–3.0)
Eosinophils Absolute: 0.2 K/uL (ref 0.0–0.7)
Eosinophils Relative: 2.5 % (ref 0.0–5.0)
HCT: 39.9 % (ref 36.0–46.0)
Hemoglobin: 13.2 g/dL (ref 12.0–15.0)
Lymphocytes Relative: 42.5 % (ref 12.0–46.0)
Lymphs Abs: 2.6 K/uL (ref 0.7–4.0)
MCHC: 33.2 g/dL (ref 30.0–36.0)
MCV: 91.8 fl (ref 78.0–100.0)
Monocytes Absolute: 0.6 K/uL (ref 0.1–1.0)
Monocytes Relative: 10 % (ref 3.0–12.0)
Neutro Abs: 2.8 K/uL (ref 1.4–7.7)
Neutrophils Relative %: 44.4 % (ref 43.0–77.0)
Platelets: 269 K/uL (ref 150.0–400.0)
RBC: 4.35 Mil/uL (ref 3.87–5.11)
RDW: 12.5 % (ref 11.5–15.5)
WBC: 6.2 K/uL (ref 4.0–10.5)

## 2023-06-08 LAB — LIPID PANEL
Cholesterol: 221 mg/dL — ABNORMAL HIGH (ref 0–200)
HDL: 48 mg/dL (ref >39.00)
LDL Cholesterol: 134 mg/dL — ABNORMAL HIGH (ref 0–99)
NonHDL: 172.77
Total CHOL/HDL Ratio: 5
Triglycerides: 193 mg/dL — ABNORMAL HIGH (ref 0.0–149.0)
VLDL: 38.6 mg/dL (ref 0.0–40.0)

## 2023-06-08 LAB — HEPATIC FUNCTION PANEL
ALT: 17 U/L (ref 0–35)
AST: 16 U/L (ref 0–37)
Albumin: 3.7 g/dL (ref 3.5–5.2)
Alkaline Phosphatase: 67 U/L (ref 39–117)
Bilirubin, Direct: 0.1 mg/dL (ref 0.0–0.3)
Total Bilirubin: 0.5 mg/dL (ref 0.2–1.2)
Total Protein: 6.4 g/dL (ref 6.0–8.3)

## 2023-06-08 NOTE — Assessment & Plan Note (Signed)
Chronic problem.  Attempting to control w/ diet and exercise.  Check labs and determine if medication is needed.  Will follow.

## 2023-06-08 NOTE — Progress Notes (Signed)
   Subjective:    Patient ID: Julie Pugh, female    DOB: [birth year], 67 y.o.   MRN: [id number]  HPI HTN- chronic problem, on Amlodipine 2.5mg  daily w/ good control.  No CP, SOB, HA's, visual changes, edema.  Hyperlipidemia- ongoing issue.  Attempting to control w/ diet and exercise.  Last LDL 135, HDL 59.  No abd pain, N/V.  Hypothyroid- chronic problem, on Levothyroxine daily.  No changes to skin/hair/nails.     Review of Systems For ROS see HPI     Objective:   Physical Exam Vitals reviewed.  Constitutional:      General: She is not in acute distress.    Appearance: Normal appearance. She is well-developed. She is not ill-appearing.  HENT:     Head: Normocephalic and atraumatic.  Eyes:     Conjunctiva/sclera: Conjunctivae normal.     Pupils: Pupils are equal, round, and reactive to light.  Neck:     Thyroid: No thyromegaly.  Cardiovascular:     Rate and Rhythm: Normal rate and regular rhythm.     Pulses: Normal pulses.     Heart sounds: Normal heart sounds. No murmur heard. Pulmonary:     Effort: Pulmonary effort is normal. No respiratory distress.     Breath sounds: Normal breath sounds.  Abdominal:     General: There is no distension.     Palpations: Abdomen is soft.     Tenderness: There is no abdominal tenderness.  Musculoskeletal:     Cervical back: Normal range of motion and neck supple.     Right lower leg: No edema.     Left lower leg: No edema.  Lymphadenopathy:     Cervical: No cervical adenopathy.  Skin:    General: Skin is warm and dry.  Neurological:     General: No focal deficit present.     Mental Status: She is alert and oriented to person, place, and time.  Psychiatric:        Mood and Affect: Mood normal.        Behavior: Behavior normal.        Thought Content: Thought content normal.           Assessment & Plan:

## 2023-06-08 NOTE — Patient Instructions (Signed)
 Schedule your complete physical in 6 months We'll notify you of your lab results and make any changes if needed Continue to work on healthy diet and regular exercise- you're doing great! Call with any questions or concerns Stay Safe!  Stay Healthy! Happy Labor Day!!

## 2023-06-08 NOTE — Assessment & Plan Note (Signed)
Chronic problem.  Well controlled on Amlodipine 2.5mg  daily.  Currently asymptomatic.  Will continue to follow.

## 2023-06-08 NOTE — Assessment & Plan Note (Signed)
Chronic problem.  Currently asymptomatic on Levothyroxine 100mcg daily.  Check labs.  Adjust meds prn  

## 2023-06-09 ENCOUNTER — Telehealth: Payer: Self-pay

## 2023-06-09 ENCOUNTER — Encounter: Payer: Self-pay | Admitting: Hematology and Oncology

## 2023-06-09 LAB — TSH: TSH: 1.05 u[IU]/mL (ref 0.35–5.50)

## 2023-06-09 NOTE — Telephone Encounter (Signed)
Pt is aware of lab results.

## 2023-06-09 NOTE — Telephone Encounter (Signed)
-----   Message from Neena Rhymes sent at 06/09/2023  7:31 AM EDT ----- Labs look good!  LDL (bad cholesterol) is stable but HDL (good cholesterol) has decreased.  This is likely due to your period of inactivity following your knee surgery and will improve w/ regular exercise.  No need for meds at this time.

## 2023-06-30 ENCOUNTER — Ambulatory Visit: Payer: Medicare Other | Admitting: Internal Medicine

## 2023-06-30 ENCOUNTER — Encounter: Payer: Self-pay | Admitting: Internal Medicine

## 2023-06-30 VITALS — BP 124/70 | HR 76 | Ht 67.0 in | Wt 157.8 lb

## 2023-06-30 DIAGNOSIS — E559 Vitamin D deficiency, unspecified: Secondary | ICD-10-CM

## 2023-06-30 DIAGNOSIS — E039 Hypothyroidism, unspecified: Secondary | ICD-10-CM | POA: Diagnosis not present

## 2023-06-30 MED ORDER — LEVOTHYROXINE SODIUM 100 MCG PO TABS
ORAL_TABLET | ORAL | 3 refills | Status: DC
Start: 2023-06-30 — End: 2024-07-04

## 2023-06-30 NOTE — Progress Notes (Signed)
Patient ID: Julie Pugh, female   DOB: 22-Jan-1956, 67 y.o.   MRN: [id number]  HPI  Julie Pugh is a 67 y.o. female, returning for f/u for hypothyroidism and vitamin D insufficiency. Last visit 1 year ago.  Interim history: She had surgery for a torn meniscus >> now again active, w/o pain, unless she exerts herself too much.  She is playing tennis, also pickleball. Before her surgery, she had a knee steroid inj in 02/2023.  No other steroid injections afterwards. She was diagnosed with bronchiectasis this spring.  Reviewed history: Pt. has been dx with hypothyroidism in 2012 (foggy mind, weight gain, fatigue); is on Armour 60 mg (equivalent of Levothyroxine 100 mcg), but was off 2016 Summer >> TSH in the Fall of 2016: 3.33 >> repeat TSH 1.83.  She was then restarted on Armour with maintenance of normal TFTs (?).  She changed to LT4 in 11/2019 per insurance preference.  She did not feel a difference after this change.  Pt is on Levothyroxine 100 daily, taken: - in am (5-6 AM) - fasting - at least 30 min (usually 1 h) from b'fast - no Ca, Fe, PPIs, + occasional multivitamins at night  - not on Biotin  Reviewed patient TFTs: Lab Results  Component Value Date   TSH 1.05 06/08/2023   TSH 1.90 12/03/2022   TSH 2.12 06/24/2022   TSH 1.88 10/22/2021   TSH 2.21 06/11/2021   TSH 2.03 10/08/2020   TSH 1.83 03/06/2020   TSH 3.53 08/31/2019   TSH 1.46 06/04/2019   TSH 1.58 05/10/2018   TSH 2.63 09/28/2017   TSH 0.97 02/21/2017   TSH 1.31 05/25/2016   TSH 1.31 11/26/2015   TSH 1.83 09/29/2015   Lab Results  Component Value Date   FREET4 0.94 06/24/2022   FREET4 1.03 06/11/2021   FREET4 1.21 03/06/2020   FREET4 0.91 06/04/2019   FREET4 0.75 05/10/2018   FREET4 0.74 02/21/2017   FREET4 0.83 05/25/2016   FREET4 0.82 11/26/2015   Lab Results  Component Value Date   T3FREE 3.4 06/04/2019   T3FREE 3.8 05/10/2018   T3FREE 4.2 02/21/2017   T3FREE 3.8 05/25/2016   T3FREE 3.4  11/26/2015   T3FREE 2.7 06/28/2014   Pt denies: - feeling nodules in neck - hoarseness - dysphagia - choking  No FH of thyroid ds. or thyroid cancer. No h/o radiation tx to head or neck. No herbal supplements. No Biotin use. No recent steroids use.   Vitamin D def:  Reviewed vitamin D levels: Lab Results  Component Value Date   VD25OH 46.12 06/24/2022   VD25OH 50.39 10/22/2021   VD25OH 53.6 06/11/2021   VD25OH 53.58 10/08/2020   VD25OH 52.3 03/06/2020   VD25OH 32.51 08/31/2019   VD25OH 27.37 (L) 06/04/2019   VD25OH 35.51 09/28/2017   VD25OH 51.77 02/21/2017   VD25OH 24.65 (L) 05/25/2016   On 5000 units vitamin D daily + MVI.  She stopped HRT 02/2015. 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  She refused to restart a statin. In 2023, she was diagnosed with breast cancer.  This was stage I, HER2 positive.  She had surgery and then RxTx. In 2023, she was found to have a higher BP >> started Amlodipine low dose.  ROS: + see HPI  I reviewed pt's medications, allergies, PMH, social hx, family hx, and changes were documented in the history of present illness. Otherwise, unchanged from my initial visit note.  Past Medical History:  Diagnosis Date  Breast cancer (HCC)    Family history of breast cancer    Family history of pancreatic cancer    Family history of prostate cancer    Hx of melanoma of skin    Hyperlipidemia    Hypertension    Hypothyroidism    Dr. Asencion Islam managing. armour thyroid 60mg     Melanoma (HCC)    R shin 2009. sees derm q6 months   Personal history of radiation therapy    Varicose vein    Past Surgical History:  Procedure Laterality Date   BREAST BIOPSY     benign 2000-stereotactic biopsy   BREAST BIOPSY Right 03/12/2004   BREAST LUMPECTOMY     BREAST LUMPECTOMY WITH RADIOACTIVE SEED AND SENTINEL LYMPH NODE BIOPSY Left 03/16/2022   Procedure: LEFT BREAST LUMPECTOMY WITH RADIOACTIVE SEED AND AXILLARY SENTINEL LYMPH NODE BIOPSY;   Surgeon: Emelia Loron, MD;  Location: [facility];  Service: General;  Laterality: Left;   VARICOSE VEIN SURGERY     2001   Social History   Socioeconomic History   Marital status: Married    Spouse name: Not on file   Number of children: Not on file   Years of education: Not on file   Highest education level: Bachelor's degree (e.g., BA, AB, BS)  Occupational History   Not on file  Tobacco Use   Smoking status: Never    Passive exposure: Past   Smokeless tobacco: Never  Vaping Use   Vaping status: Never Used  Substance and Sexual Activity   Alcohol use: Yes    Alcohol/week: 4.0 standard drinks of alcohol    Types: 4 Standard drinks or equivalent per week   Drug use: No   Sexual activity: Yes    Birth control/protection: Post-menopausal  Other Topics Concern   Not on file  Social History Narrative   Married (marriage is a stressor). 1 daughter Kirt Boys at [employer] age 70 in 2016.       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       Hobbies: tennis, walking, time with dogs, pickleball. Reading. Outdoors.    Social Determinants of Health   Financial Resource Strain: Low Risk  (06/07/2023)   Overall Financial Resource Strain (CARDIA)    Difficulty of Paying Living Expenses: Not hard at all  Food Insecurity: No Food Insecurity (06/07/2023)   Hunger Vital Sign    Worried About Running Out of Food in the Last Year: Never true    Ran Out of Food in the Last Year: Never true  Transportation Needs: No Transportation Needs (06/07/2023)   PRAPARE - Administrator, Civil Service (Medical): No    Lack of Transportation (Non-Medical): No  Physical Activity: Sufficiently Active (06/07/2023)   Exercise Vital Sign    Days of Exercise per Week: 5 days    Minutes of Exercise per Session: 70 min  Stress: No Stress Concern Present (06/07/2023)   [employer] of Occupational Health - Occupational Stress  Questionnaire    Feeling of Stress : Not at all  Social Connections: Socially Integrated (06/07/2023)   Social Connection and Isolation Panel [NHANES]    Frequency of Communication with Friends and Family: More than three times a week    Frequency of Social Gatherings with Friends and Family: Three times a week    Attends Religious Services: More than 4 times per year    Active Member of Clubs or Organizations: Yes  Attends Banker Meetings: More than 4 times per year    Marital Status: Married  Catering manager Violence: Not on file   Current Outpatient Medications on File Prior to Visit  Medication Sig Dispense Refill   amLODipine (NORVASC) 2.5 MG tablet TAKE 1 TABLET(2.5 MG) BY MOUTH DAILY 90 tablet 1   Cholecalciferol (VITAMIN D) 2000 units tablet Take 5,000 Units by mouth daily.     diphenhydramine-acetaminophen (TYLENOL PM) 25-500 MG TABS tablet Take 1 tablet by mouth at bedtime as needed.     letrozole (FEMARA) 2.5 MG tablet Take 1 tablet (2.5 mg total) by mouth daily. 90 tablet 3   levothyroxine (SYNTHROID) 100 MCG tablet TAKE 1 TABLET(100 MCG) BY MOUTH DAILY 90 tablet 3   METRONIDAZOLE, TOPICAL, 0.75 % LOTN      No current facility-administered medications on file prior to visit.   No Known Allergies Family History  Problem Relation Age of Onset   Panic disorder Mother    COPD Mother        smoked until 60   Congestive Heart Failure Mother        pacemaker   Hypertension Mother    Heart attack Father        bypass 18, 50 fatal MI, former smoker   Prostate cancer Father 52   Hyperlipidemia Sister    Breast cancer Maternal Aunt    Pancreatic cancer Cousin 78   PE: BP 124/70   Pulse 76   Ht 5\' 7"  (1.702 m)   Wt 157 lb 12.8 oz (71.6 kg)   SpO2 97%   BMI 24.71 kg/m  Wt Readings from Last 3 Encounters:  06/30/23 157 lb 12.8 oz (71.6 kg)  06/08/23 157 lb (71.2 kg)  04/08/23 153 lb 1.6 oz (69.4 kg)   Constitutional: normal weight, in NAD Eyes:   EOMI, no exophthalmos ENT: no neck masses, no cervical lymphadenopathy Cardiovascular: RRR, No MRG Respiratory: + bibasilar wheezing, otherwise CTA B Musculoskeletal: no deformities Skin:no rashes Neurological: no tremor with outstretched hands  ASSESSMENT: 1. Hypothyroidism  2. Vitamin D insufficiency  PLAN:  1. Patient with history of hypothyroidism, on desiccated thyroid extract.  She was on Armour 60 mg daily (equivalent of 100 mcg of levothyroxine), but she had to change to levothyroxine (LT4) per insurance preference.  She did not feel different after changing the formulation. - latest thyroid labs reviewed with pt. >> normal less than a month ago: Lab Results  Component Value Date   TSH 1.05 06/08/2023  - she continues on LT4 100 mcg daily - pt feels good on this dose.  At last visit she described more fatigue after radiotherapy.  She also had headaches.  These resolved. - we discussed about taking the thyroid hormone every day, with water, >30 minutes before breakfast, separated by >4 hours from acid reflux medications, calcium, iron, multivitamins. Pt. is taking it correctly. - RTC in 1 year  2. Vit D insufficiency -She continues vitamin D 5000 units daily + daily multivitamin now (previously was taking this only during the winter) -At last visit, vitamin D was excellent, at 46.12 -We will repeat the level today   Component     Latest Ref Rng 06/30/2023  VITD     30.00 - 100.00 ng/mL 62.37   Normal vitamin D.  Carlus Pavlov, MD PhD [facility]

## 2023-06-30 NOTE — Patient Instructions (Signed)
Please continue Levothyroxine 100 mcg daily.  Take the thyroid hormone every day, with water, at least 30 minutes before breakfast, separated by at least 4 hours from: - acid reflux medications - calcium - iron - multivitamins  Please continue 5000 units vitamin D daily.  Please stop at the lab.  Please return in 1 year.

## 2023-07-01 LAB — VITAMIN D 25 HYDROXY (VIT D DEFICIENCY, FRACTURES): VITD: 62.37 ng/mL (ref 30.00–100.00)

## 2023-07-02 ENCOUNTER — Other Ambulatory Visit: Payer: Self-pay

## 2023-08-08 ENCOUNTER — Ambulatory Visit: Payer: Medicare Other

## 2023-08-11 DIAGNOSIS — C50412 Malignant neoplasm of upper-outer quadrant of left female breast: Secondary | ICD-10-CM | POA: Diagnosis not present

## 2023-08-11 DIAGNOSIS — Z170 Estrogen receptor positive status [ER+]: Secondary | ICD-10-CM | POA: Diagnosis not present

## 2023-08-17 ENCOUNTER — Encounter: Payer: Self-pay | Admitting: Physical Therapy

## 2023-08-17 ENCOUNTER — Ambulatory Visit: Payer: Medicare Other | Attending: General Surgery | Admitting: Physical Therapy

## 2023-08-17 DIAGNOSIS — C50412 Malignant neoplasm of upper-outer quadrant of left female breast: Secondary | ICD-10-CM | POA: Insufficient documentation

## 2023-08-17 DIAGNOSIS — Z170 Estrogen receptor positive status [ER+]: Secondary | ICD-10-CM | POA: Insufficient documentation

## 2023-08-17 NOTE — Therapy (Signed)
OUTPATIENT PHYSICAL THERAPY SOZO SCREENING NOTE   Patient Name: Julie Pugh MRN: [id number] DOB:1956-02-27, 67 y.o., female Today's Date: 08/17/2023  PCP: Sheliah Hatch, MD REFERRING PROVIDER: Emelia Loron, MD   PT End of Session - 08/17/23 1509     Visit Number 2   unchanged due to screen only   PT Start Time 1503    PT Stop Time 1509    PT Time Calculation (min) 6 min    Activity Tolerance Patient tolerated treatment well    Behavior During Therapy WFL for tasks assessed/performed             Past Medical History:  Diagnosis Date   Breast cancer (HCC)    Family history of breast cancer    Family history of pancreatic cancer    Family history of prostate cancer    Hx of melanoma of skin    Hyperlipidemia    Hypertension    Hypothyroidism    Dr. Asencion Islam managing. armour thyroid 60mg     Melanoma (HCC)    R shin 2009. sees derm q6 months   Personal history of radiation therapy    Varicose vein    Past Surgical History:  Procedure Laterality Date   BREAST BIOPSY     benign 2000-stereotactic biopsy   BREAST BIOPSY Right 03/12/2004   BREAST LUMPECTOMY     BREAST LUMPECTOMY WITH RADIOACTIVE SEED AND SENTINEL LYMPH NODE BIOPSY Left 03/16/2022   Procedure: LEFT BREAST LUMPECTOMY WITH RADIOACTIVE SEED AND AXILLARY SENTINEL LYMPH NODE BIOPSY;  Surgeon: Emelia Loron, MD;  Location: [facility];  Service: General;  Laterality: Left;   VARICOSE VEIN SURGERY     2001   Patient Active Problem List   Diagnosis Date Noted   Acute medial meniscus tear of left knee 02/23/2023   OSA (obstructive sleep apnea) 11/09/2022   Snoring 08/13/2022   Bronchiectasis without complication (HCC) 08/13/2022   HTN (hypertension) 07/04/2022   Physical exam 05/20/2022   Family history of breast cancer 05/05/2022   Family history of pancreatic cancer 05/05/2022   Family history of prostate cancer 05/05/2022   Elevated blood pressure reading 04/23/2022    Malignant neoplasm of upper-outer quadrant of left breast in female, estrogen receptor positive (HCC) 02/12/2022   History of COVID-19 06/12/2020   Hyperlipidemia 07/10/2015   Hypothyroidism    History of melanoma    Varicose vein     REFERRING DIAG: right breast cancer at risk for lymphedema  THERAPY DIAG:  Malignant neoplasm of upper-outer quadrant of left breast in female, estrogen receptor positive (HCC)  PERTINENT HISTORY: Patient was diagnosed on 02/08/22 with left grade 2. It measures less than 1 cm and is located in the upper outer quadrant. It is ER/PR+ (HER2 equivocal - waiting on results) with a Ki67 of 30%. L Lumpectomy and SLNB (0/2) on 03/16/22   PRECAUTIONS: right UE Lymphedema risk  SUBJECTIVE: Here for SOZO screen  PAIN:  Are you having pain? No  SOZO SCREENING: Patient was assessed today using the SOZO machine to determine the lymphedema index score. This was compared to her baseline score. It was determined that she is within the recommended range when compared to her baseline and no further action is needed at this time. She will continue SOZO screenings. These are done every 3 months for 2 years post operatively followed by every 6 months for 2 years, and then annually.   L-DEX FLOWSHEETS - 08/17/23 1500       L-DEX LYMPHEDEMA  SCREENING   Measurement Type Unilateral    L-DEX MEASUREMENT EXTREMITY Upper Extremity    POSITION  Standing    DOMINANT SIDE Right    At Risk Side Left    BASELINE SCORE (UNILATERAL) -2.7    L-DEX SCORE (UNILATERAL) -1.7    VALUE CHANGE (UNILAT) 1             [employer], PT 08/17/23 3:11 PM

## 2023-08-18 ENCOUNTER — Ambulatory Visit: Payer: Medicare Other | Admitting: Adult Health

## 2023-08-19 LAB — SIGNATERA
SIGNATERA MTM READOUT: 0 MTM/ml
SIGNATERA TEST RESULT: NEGATIVE

## 2023-08-26 ENCOUNTER — Telehealth: Payer: Self-pay

## 2023-08-26 NOTE — Telephone Encounter (Signed)
Called pt per MD to advise [employer] testing was negative/not detected. Pt verbalized understanding of results and knows [facility]ail will be in touch to schedule 6 mo repeat lab.

## 2023-09-01 ENCOUNTER — Ambulatory Visit: Payer: Medicare Other | Admitting: Adult Health

## 2023-09-12 ENCOUNTER — Ambulatory Visit (HOSPITAL_BASED_OUTPATIENT_CLINIC_OR_DEPARTMENT_OTHER): Payer: Medicare Other | Admitting: Pulmonary Disease

## 2023-09-13 ENCOUNTER — Encounter (HOSPITAL_BASED_OUTPATIENT_CLINIC_OR_DEPARTMENT_OTHER): Payer: Self-pay | Admitting: Pulmonary Disease

## 2023-09-13 ENCOUNTER — Ambulatory Visit (HOSPITAL_BASED_OUTPATIENT_CLINIC_OR_DEPARTMENT_OTHER): Payer: Medicare Other | Admitting: Pulmonary Disease

## 2023-09-13 VITALS — BP 122/70 | HR 67 | Resp 16 | Ht 67.0 in | Wt 157.0 lb

## 2023-09-13 DIAGNOSIS — G4733 Obstructive sleep apnea (adult) (pediatric): Secondary | ICD-10-CM | POA: Diagnosis not present

## 2023-09-13 DIAGNOSIS — J479 Bronchiectasis, uncomplicated: Secondary | ICD-10-CM | POA: Diagnosis not present

## 2023-09-13 NOTE — Patient Instructions (Signed)
Regional bronchiectasis -Likely related to prior respiratory infection -Asymptomatic -Flutter valve as needed 5-10 inhalations five times a day -ORDER CT Chest without contrast for end of Feb 2025  Mild OSA -Hold on treatment since asymptomatic except when she is on back -Recommend positioning to sleep on the side -Encourage good sleep hygiene -Manage good weight range -If symptoms worsen or if you gain 10-20 lb, may need to reconsider treatment

## 2023-09-13 NOTE — Progress Notes (Signed)
Subjective:   PATIENT ID: Julie Pugh GENDER: female DOB: [birth year], MRN: [id number]  Chief Complaint  Patient presents with   Establish Care    Previous [facility] patient    Reason for Visit: New patient. Previously followed by Dr. Craige Cotta  Ms. Maretta Ferroni is a 67 year old female never smoker with OSA, hypothyroidism, HTN, HLD, hx breast cancer s/p left lumpectomy s/p RT and chemo, hx melanoma who presents to establish care with me. She was previously seen by Dr. Craige Cotta for regional bronchiectasis and mild OSA with plan for oral appliance. She was seen by Dr. Toni Arthurs and declined $3000 device. She reports good quality sleep except when she lays on her back when she snores. Denies daytime sleepiness or concentration issues. Denies shortness of breath, cough or wheezing. Active and plays tennis  Social History: Never smoker  Tennis colleague with [employer]  I have personally reviewed patient's past medical/family/social history, allergies, current medications.  Past Medical History:  Diagnosis Date   Breast cancer (HCC)    Family history of breast cancer    Family history of pancreatic cancer    Family history of prostate cancer    Hx of melanoma of skin    Hyperlipidemia    Hypertension    Hypothyroidism    Dr. Asencion Islam managing. armour thyroid 60mg     Melanoma (HCC)    R shin 2009. sees derm q6 months   Personal history of radiation therapy    Varicose vein      Family History  Problem Relation Age of Onset   Panic disorder Mother    COPD Mother        smoked until 26   Congestive Heart Failure Mother        pacemaker   Hypertension Mother    Heart attack Father        bypass 4, 57 fatal MI, former smoker   Prostate cancer Father 31   Hyperlipidemia Sister    Breast cancer Maternal Aunt    Pancreatic cancer Cousin 96     Social History   Occupational History   Not on file  Tobacco Use   Smoking status: Never    Passive exposure: Past   Smokeless tobacco:  Never  Vaping Use   Vaping status: Never Used  Substance and Sexual Activity   Alcohol use: Yes    Alcohol/week: 4.0 standard drinks of alcohol    Types: 4 Standard drinks or equivalent per week   Drug use: No   Sexual activity: Yes    Birth control/protection: Post-menopausal    No Known Allergies   Outpatient Medications Prior to Visit  Medication Sig Dispense Refill   amLODipine (NORVASC) 2.5 MG tablet TAKE 1 TABLET(2.5 MG) BY MOUTH DAILY 90 tablet 1   Cholecalciferol (VITAMIN D) 2000 units tablet Take 5,000 Units by mouth daily.     diphenhydramine-acetaminophen (TYLENOL PM) 25-500 MG TABS tablet Take 1 tablet by mouth at bedtime as needed.     letrozole (FEMARA) 2.5 MG tablet Take 1 tablet (2.5 mg total) by mouth daily. 90 tablet 3   levothyroxine (SYNTHROID) 100 MCG tablet TAKE 1 TABLET(100 MCG) BY MOUTH DAILY 90 tablet 3   METRONIDAZOLE, TOPICAL, 0.75 % LOTN      No facility-administered medications prior to visit.    Review of Systems  Constitutional:  Negative for chills, diaphoresis, fever, malaise/fatigue and weight loss.  HENT:  Negative for congestion.   Respiratory:  Negative for cough, hemoptysis, sputum  production, shortness of breath and wheezing.   Cardiovascular:  Negative for chest pain, palpitations and leg swelling.     Objective:   Vitals:   09/13/23 1306  BP: 122/70  Pulse: 67  Resp: 16  SpO2: 97%  Weight: 157 lb (71.2 kg)  Height: 5\' 7"  (1.702 m)   SpO2: 97 %  Physical Exam: General: Well-appearing, no acute distress HENT: [place], AT Eyes: EOMI, no scleral icterus Respiratory: Clear to auscultation bilaterally.  No crackles, wheezing or rales Cardiovascular: RRR, -M/R/G, no JVD Extremities:-Edema,-tenderness Neuro: AAO x4, CNII-XII grossly intact Psych: Normal mood, normal affect  Data Reviewed:  Imaging: CT Chest 10/19/22 - Bronchiectasis with peribronchovascular nodularity and GGO opacity in RML and lingula. Possible chronic  MAC PFT: 11/02/22 FVC 2.78 (87%) FEV1 2.05 (84%) Ratio 73  TLC 109% DLCO 95% Interpretation: No obstructive or restrictive defect. Normal PFTs  Labs: CBC    Component Value Date/Time   WBC 6.2 06/08/2023 1403   RBC 4.35 06/08/2023 1403   HGB 13.2 06/08/2023 1403   HGB 13.0 10/20/2022 0901   HCT 39.9 06/08/2023 1403   PLT 269.0 06/08/2023 1403   PLT 220 10/20/2022 0901   MCV 91.8 06/08/2023 1403   MCH 30.2 12/03/2022 1409   MCHC 33.2 06/08/2023 1403   RDW 12.5 06/08/2023 1403   LYMPHSABS 2.6 06/08/2023 1403   MONOABS 0.6 06/08/2023 1403   EOSABS 0.2 06/08/2023 1403   BASOSABS 0.0 06/08/2023 1403   Absolute eos 06/08/23 - 200     Assessment & Plan:   Discussion: 67 year old female never smoker with OSA, hypothyroidism, HTN, HLD, hx breast cancer s/p left lumpectomy s/p RT and chemo, hx melanoma who presents to establish care. Overall asymptomatic. Active at baseline.  Regional bronchiectasis -Likely related to prior respiratory infection -Asymptomatic -Flutter valve as needed 5-10 inhalations five times a day -ORDER CT Chest without contrast for end of Feb 2025  Mild OSA -Hold on treatment since asymptomatic except when she is on back -Recommend positioning to sleep on the side -Encourage good sleep hygiene -Manage good weight range -If symptoms worsen or if you gain 10-20 lb, may need to reconsider treatment  Health Maintenance  There is no immunization history on file for this patient. CT Lung Screen - not qualified  Orders Placed This Encounter  Procedures   CT Chest Wo Contrast    Standing Status:   Future    Standing Expiration Date:   09/12/2024    Scheduling Instructions:     Schedule for end of Feb 2025    Order Specific Question:   Preferred imaging location?    Answer:   [facility]  No orders of the defined types were placed in this encounter.   Return in about 3 months (around 12/12/2023) for after CT scan. .  I have spent a total time  of 42-minutes on the day of the appointment reviewing prior documentation, coordinating care and discussing medical diagnosis and plan with the patient/family. Imaging, labs and tests included in this note have been reviewed and interpreted independently by me.  Estoria Geary Mechele Collin, MD [facility] 09/13/2023 1:29 PM

## 2023-10-02 DIAGNOSIS — R509 Fever, unspecified: Secondary | ICD-10-CM | POA: Diagnosis not present

## 2023-10-02 DIAGNOSIS — R059 Cough, unspecified: Secondary | ICD-10-CM | POA: Diagnosis not present

## 2023-10-02 DIAGNOSIS — R519 Headache, unspecified: Secondary | ICD-10-CM | POA: Diagnosis not present

## 2023-10-20 ENCOUNTER — Ambulatory Visit: Payer: Medicare Other | Admitting: Adult Health

## 2023-11-15 ENCOUNTER — Encounter (HOSPITAL_BASED_OUTPATIENT_CLINIC_OR_DEPARTMENT_OTHER): Payer: Self-pay

## 2023-11-15 ENCOUNTER — Ambulatory Visit (HOSPITAL_BASED_OUTPATIENT_CLINIC_OR_DEPARTMENT_OTHER)
Admission: RE | Admit: 2023-11-15 | Discharge: 2023-11-15 | Disposition: A | Discharge status: Home / Self Care | Payer: Medicare Other | Source: Ambulatory Visit | Attending: Pulmonary Disease | Admitting: Pulmonary Disease

## 2023-11-15 DIAGNOSIS — R911 Solitary pulmonary nodule: Secondary | ICD-10-CM | POA: Diagnosis not present

## 2023-11-15 DIAGNOSIS — J479 Bronchiectasis, uncomplicated: Secondary | ICD-10-CM | POA: Insufficient documentation

## 2023-11-15 DIAGNOSIS — Z170 Estrogen receptor positive status [ER+]: Secondary | ICD-10-CM | POA: Diagnosis not present

## 2023-11-15 DIAGNOSIS — I700 Atherosclerosis of aorta: Secondary | ICD-10-CM | POA: Diagnosis not present

## 2023-11-15 DIAGNOSIS — C50412 Malignant neoplasm of upper-outer quadrant of left female breast: Secondary | ICD-10-CM | POA: Diagnosis not present

## 2023-11-20 LAB — SIGNATERA
SIGNATERA MTM READOUT: 0 MTM/ml
SIGNATERA TEST RESULT: NEGATIVE

## 2023-11-21 ENCOUNTER — Ambulatory Visit: Payer: Medicare Other

## 2023-11-23 ENCOUNTER — Telehealth: Payer: Self-pay

## 2023-11-23 NOTE — Telephone Encounter (Signed)
Attempted to call pt regarding signatera results lvm for pt to return call back to receive signatera results. Results was negative.

## 2023-11-27 ENCOUNTER — Other Ambulatory Visit: Payer: Self-pay | Admitting: Family Medicine

## 2023-11-27 DIAGNOSIS — R030 Elevated blood-pressure reading, without diagnosis of hypertension: Secondary | ICD-10-CM

## 2023-12-14 ENCOUNTER — Other Ambulatory Visit: Payer: Self-pay | Admitting: Hematology and Oncology

## 2023-12-14 DIAGNOSIS — Z9889 Other specified postprocedural states: Secondary | ICD-10-CM

## 2023-12-21 ENCOUNTER — Encounter (HOSPITAL_BASED_OUTPATIENT_CLINIC_OR_DEPARTMENT_OTHER): Payer: Self-pay | Admitting: Pulmonary Disease

## 2024-01-06 ENCOUNTER — Encounter: Payer: Self-pay | Admitting: Hematology and Oncology

## 2024-01-13 IMAGING — MG MM PLC BREAST LOC DEV 1ST LESION INC MAMMO GUIDE*L*
6 series · 6 of 6 positions shown · non-contrast
Comparison: None Available.

CLINICAL DATA: 66-year-old female presenting for seed localization
of the left breast. Patient has recently diagnosed left breast
invasive ductal carcinoma.

EXAM:
MAMMOGRAPHIC GUIDED RADIOACTIVE SEED LOCALIZATION OF THE LEFT BREAST

[L CC (1 of 2)]
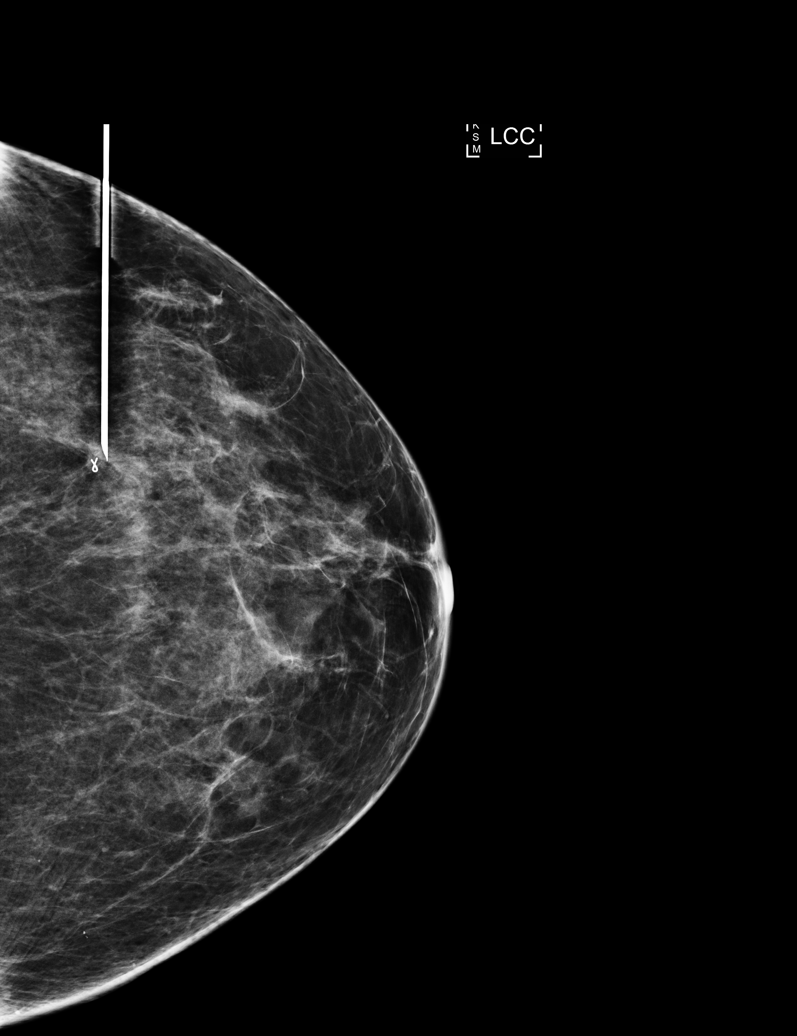

[L CC (2 of 2)]
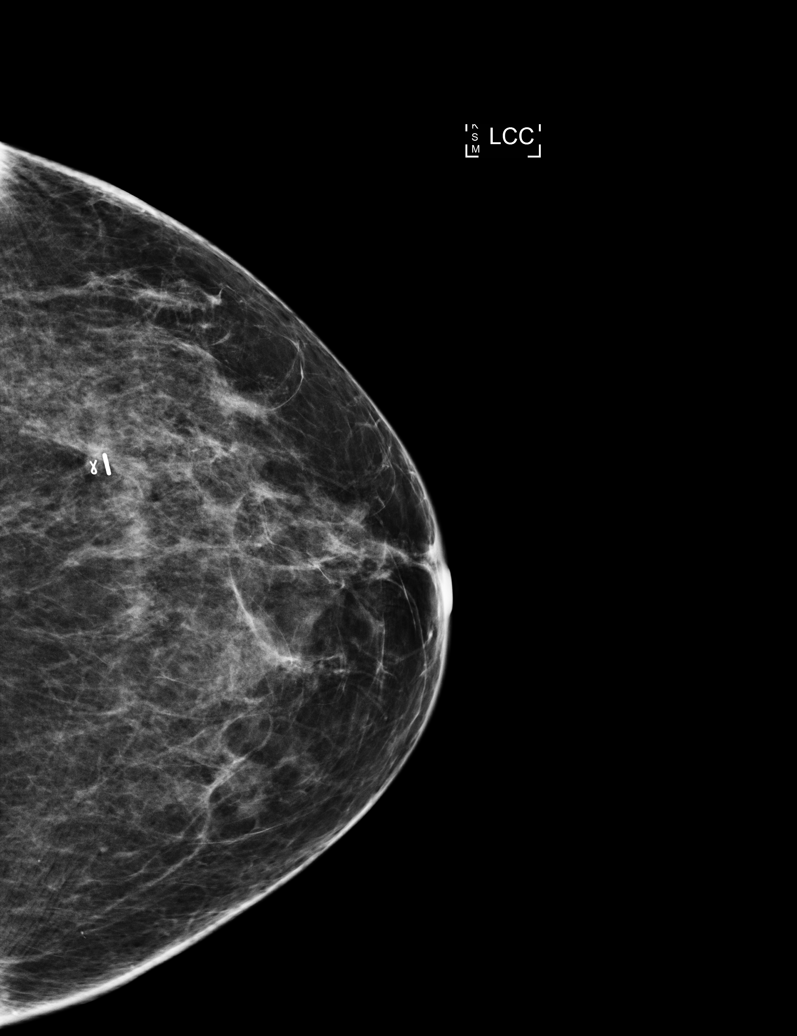

[L LM (1 of 4)]
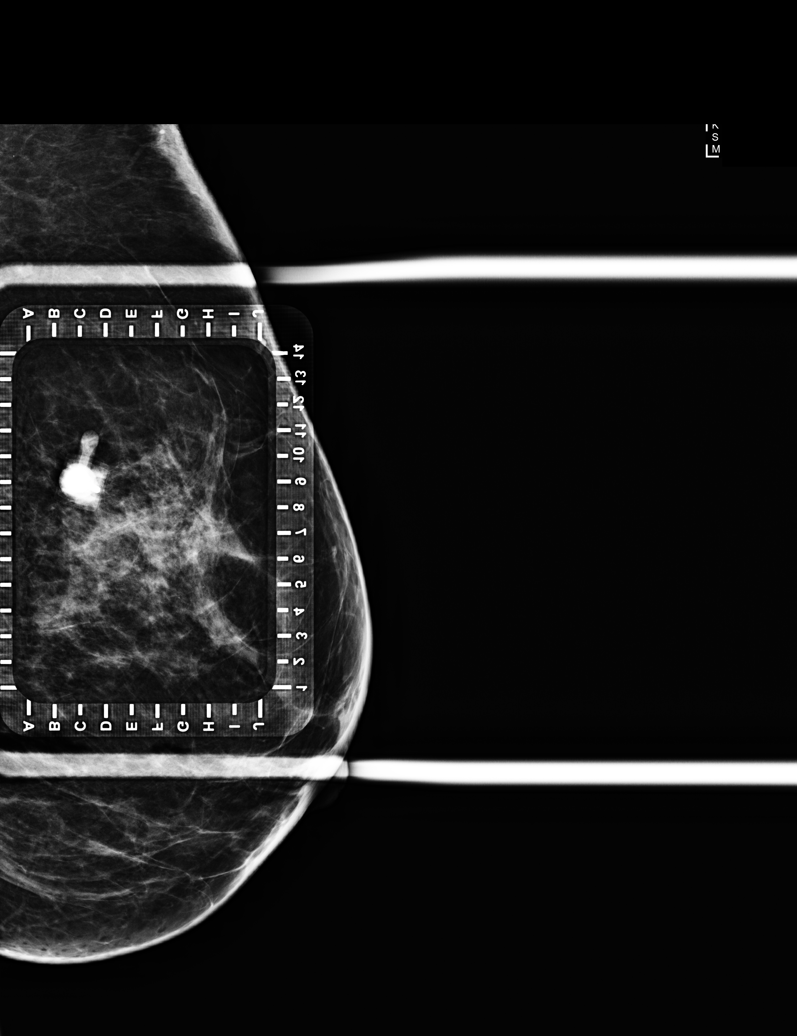

[L LM (2 of 4)]
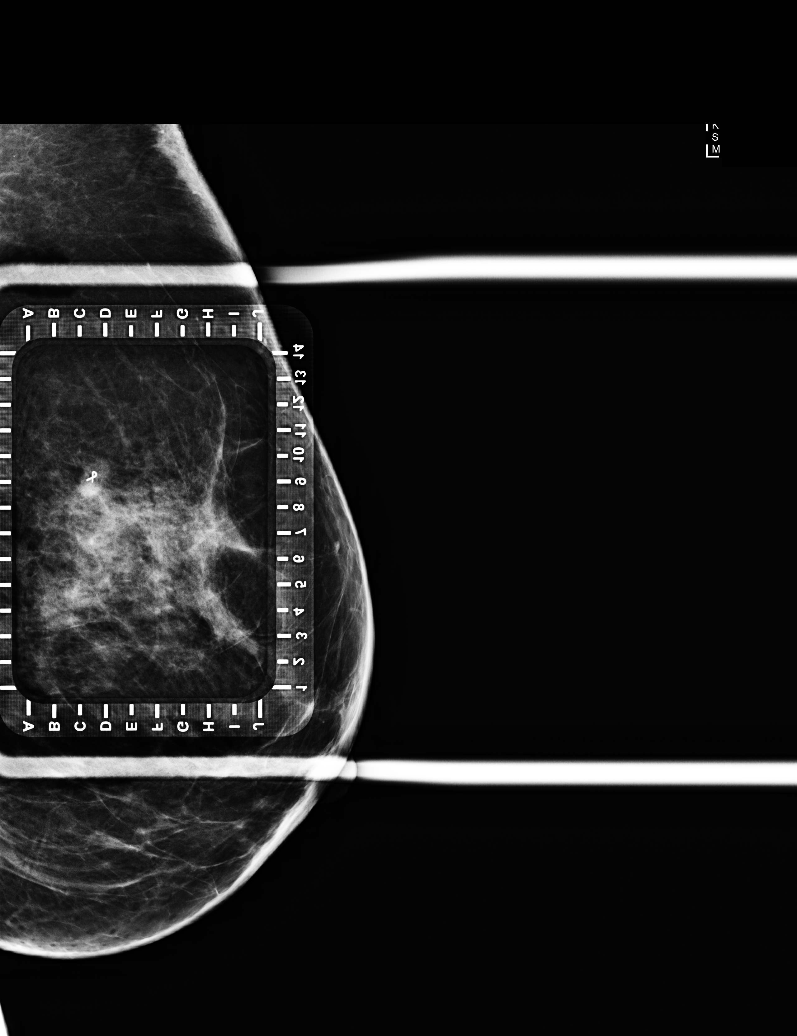

[L LM (3 of 4)]
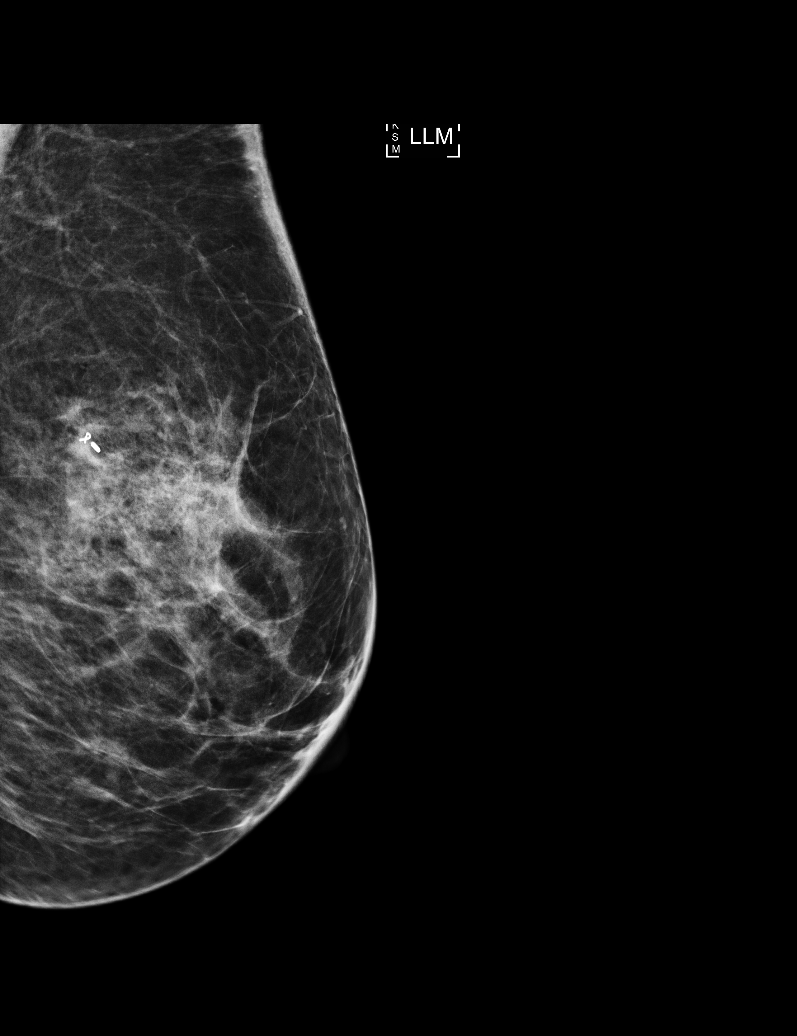

[L LM (4 of 4)]
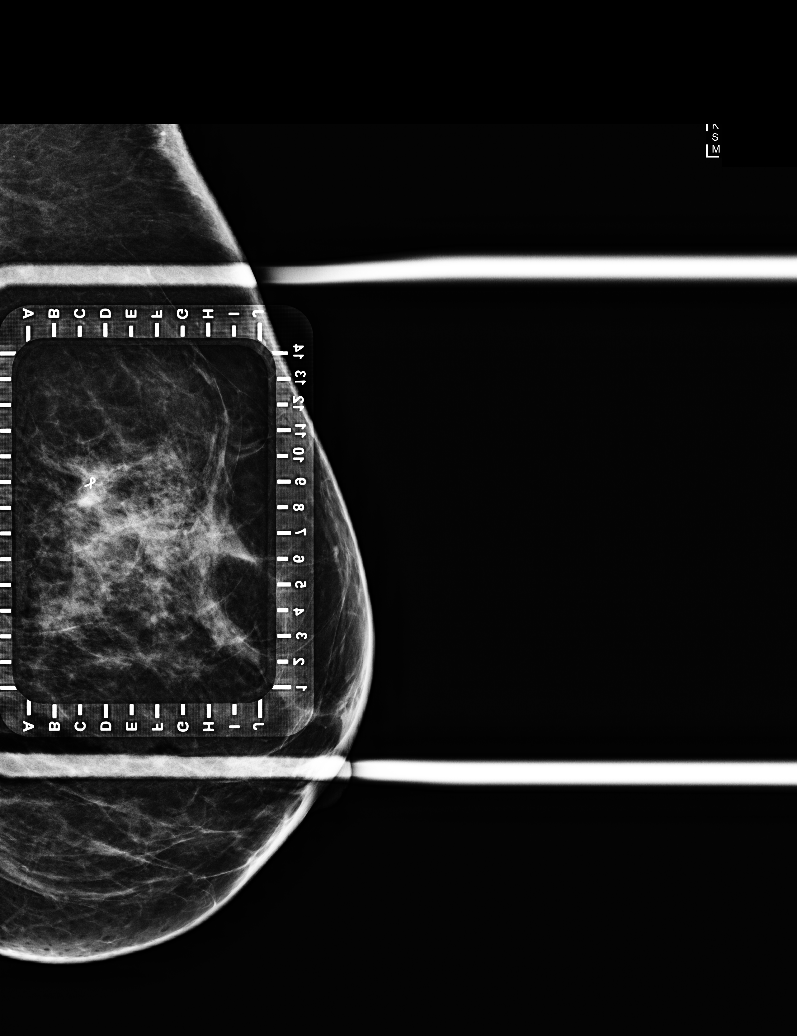

[6 of 6 positions shown; findings below may reference images not displayed]

FINDINGS: Patient presents for radioactive seed localization prior to . I met
with the patient and we discussed the procedure of seed localization
including benefits and alternatives. We discussed the high
likelihood of a successful procedure. We discussed the risks of the
procedure including infection, bleeding, tissue injury and further
surgery. We discussed the low dose of radioactivity involved in the
procedure. Informed, written consent was given.

The usual time-out protocol was performed immediately prior to the
procedure.

Using mammographic guidance, sterile technique, 1% lidocaine and an
X-UKV radioactive seed, the ribbon shaped biopsy marking clip was
localized using a lateral approach. The follow-up mammogram images
confirm the seed in the expected location and were marked for Dr.
Vitia.

Follow-up survey of the patient confirms presence of the radioactive
seed.

Order number of X-UKV seed:  [id number].

Total activity: 0.247 mCi reference Date: February 17, 2022

The patient tolerated the procedure well and was released from the
[REDACTED]. She was given instructions regarding seed removal.
IMPRESSION: Radioactive seed localization left breast. No apparent
complications.

## 2024-01-14 IMAGING — DX MM BREAST SURGICAL SPECIMEN
1 series · 2 of 2 positions shown · non-contrast
Comparison: None Available.

CLINICAL DATA: Post lumpectomy specimen radiograph

EXAM:
SPECIMEN RADIOGRAPH OF THE LEFT BREAST

[Series 1: specimen digital x-ray · left · 0.07mm/px · 2 of 2 slices shown]
[im 1/2]
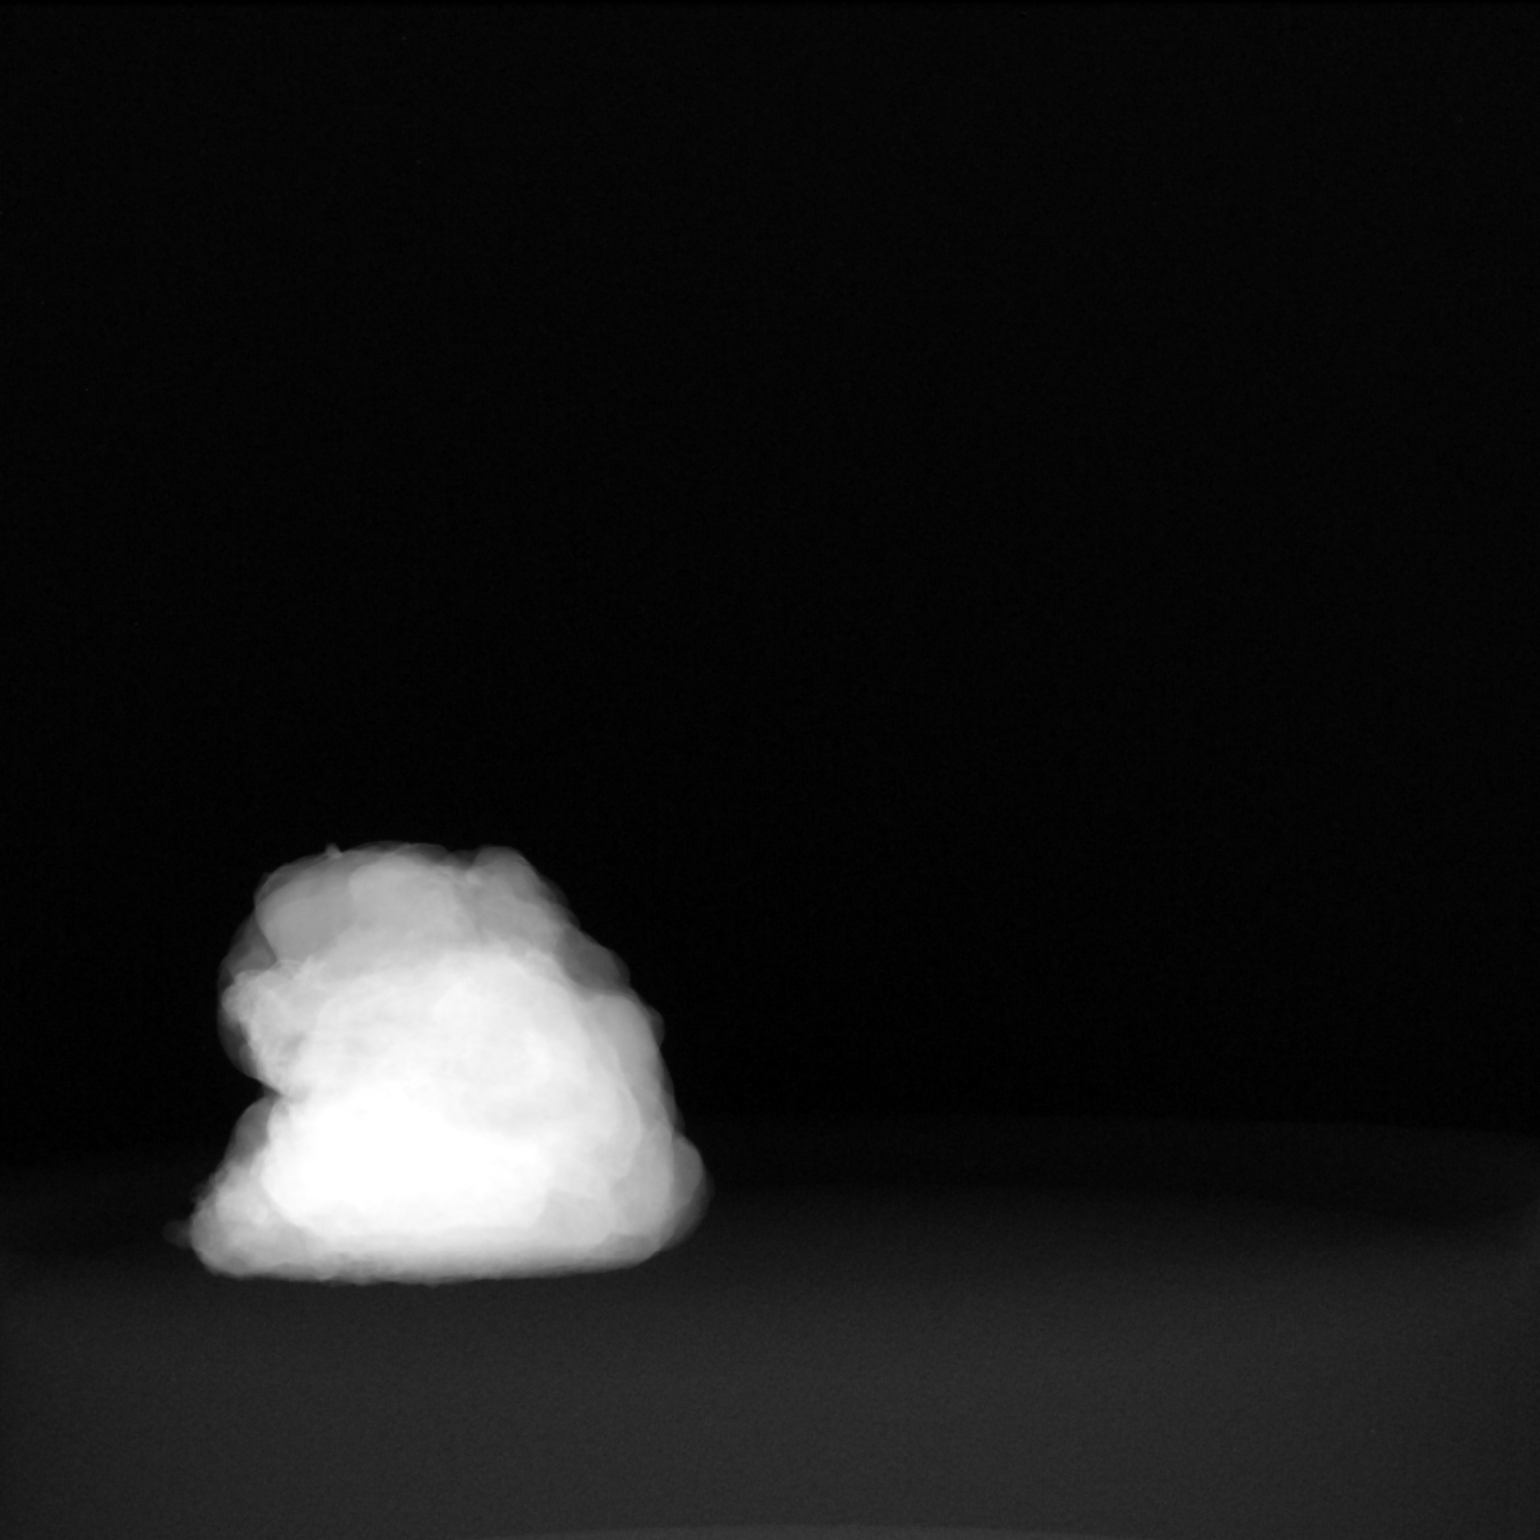
[im 2/2]
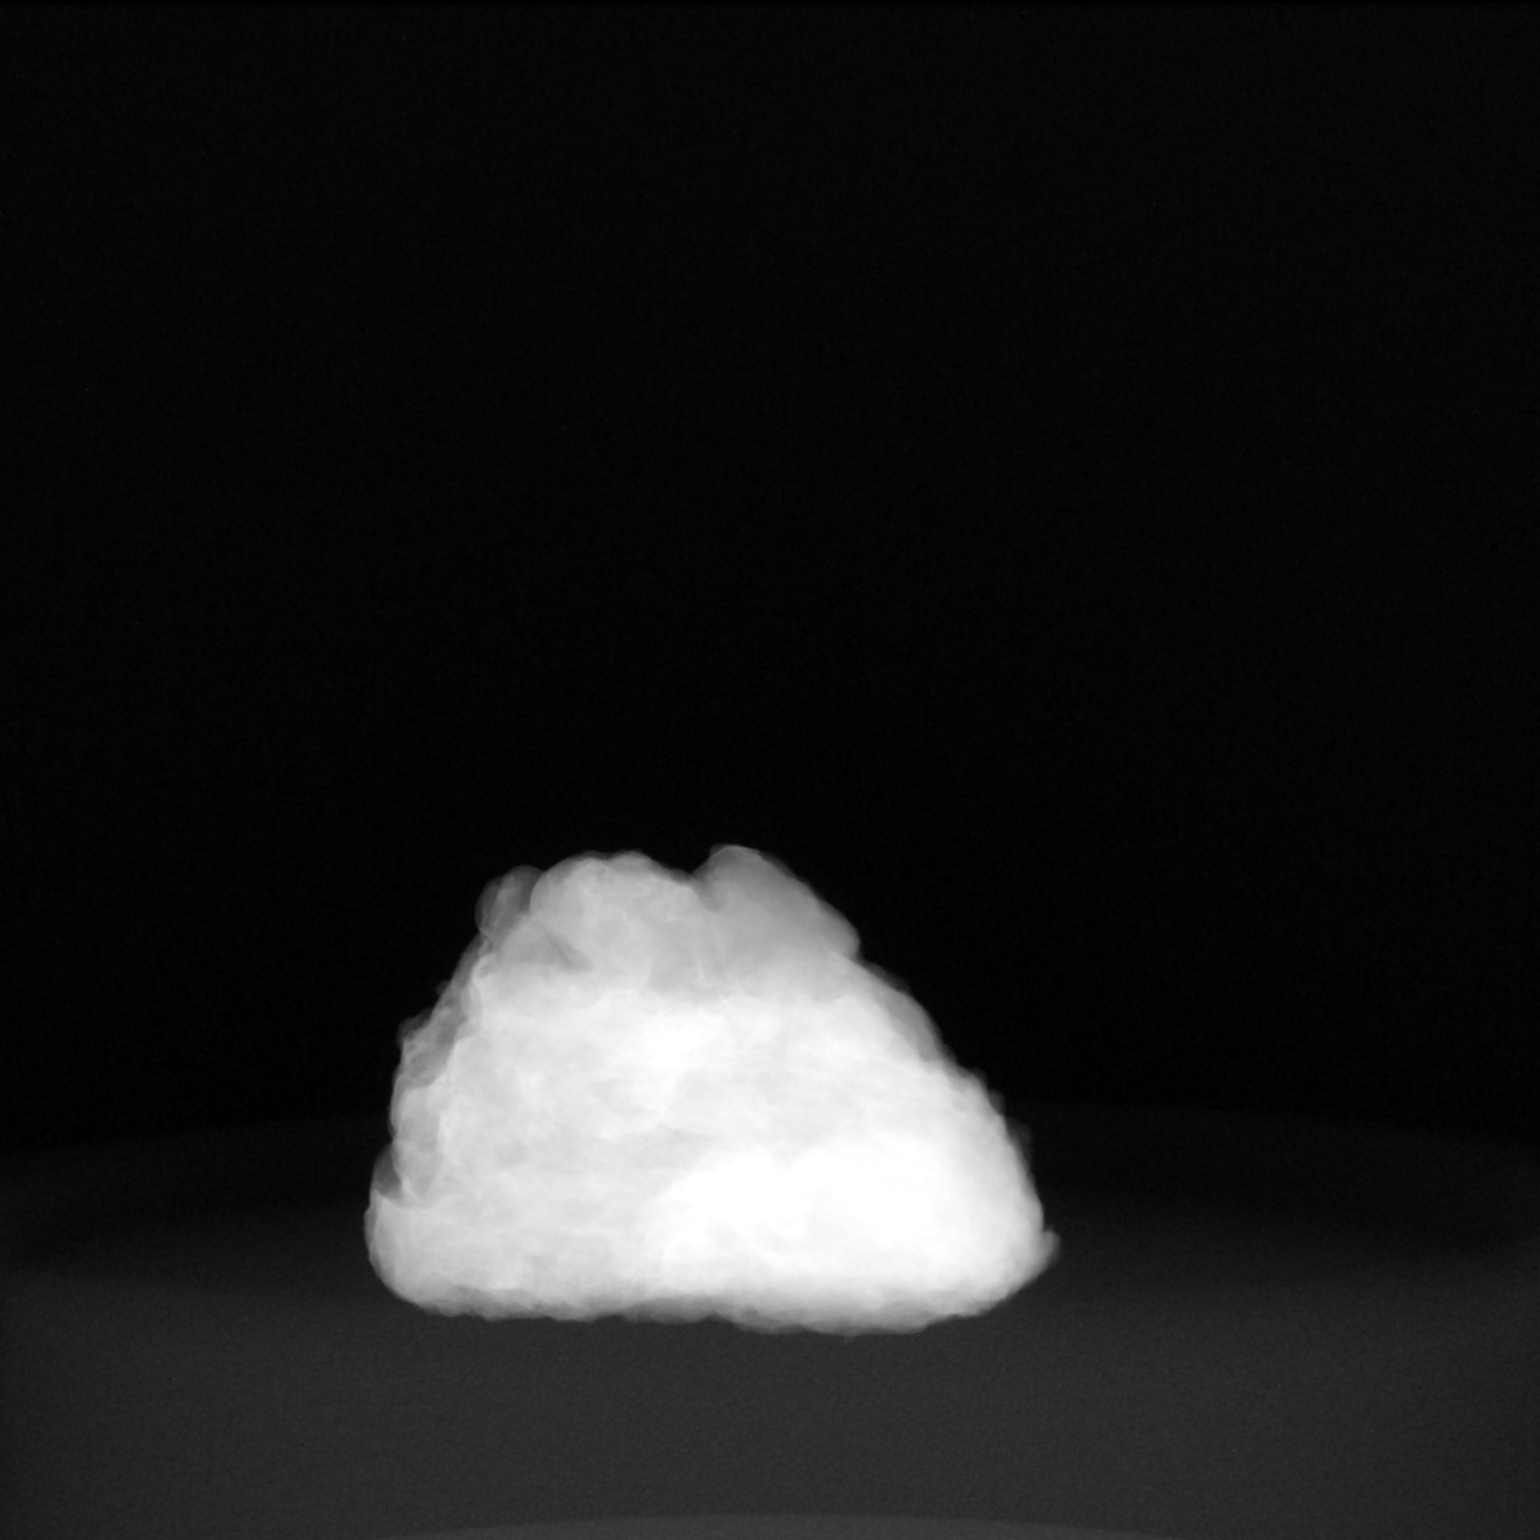

[2 of 2 positions shown; findings below may reference images not displayed]

FINDINGS: Status post excision of the left breast. The radioactive seed and
biopsy marker clip are present and completely intact.
IMPRESSION: Specimen radiograph of the left breast.

## 2024-01-16 ENCOUNTER — Ambulatory Visit (HOSPITAL_BASED_OUTPATIENT_CLINIC_OR_DEPARTMENT_OTHER): Payer: Medicare Other | Admitting: Pulmonary Disease

## 2024-01-16 ENCOUNTER — Encounter (HOSPITAL_BASED_OUTPATIENT_CLINIC_OR_DEPARTMENT_OTHER): Payer: Self-pay | Admitting: Pulmonary Disease

## 2024-01-16 ENCOUNTER — Ambulatory Visit
Admission: RE | Admit: 2024-01-16 | Discharge: 2024-01-16 | Disposition: A | Discharge status: Home / Self Care | Source: Ambulatory Visit | Attending: Hematology and Oncology | Admitting: Hematology and Oncology

## 2024-01-16 VITALS — BP 124/82 | HR 89 | Ht 67.0 in | Wt 155.5 lb

## 2024-01-16 DIAGNOSIS — Z853 Personal history of malignant neoplasm of breast: Secondary | ICD-10-CM | POA: Diagnosis not present

## 2024-01-16 DIAGNOSIS — Z9889 Other specified postprocedural states: Secondary | ICD-10-CM

## 2024-01-16 DIAGNOSIS — Z08 Encounter for follow-up examination after completed treatment for malignant neoplasm: Secondary | ICD-10-CM | POA: Diagnosis not present

## 2024-01-16 DIAGNOSIS — J479 Bronchiectasis, uncomplicated: Secondary | ICD-10-CM | POA: Diagnosis not present

## 2024-01-16 NOTE — Progress Notes (Unsigned)
 Subjective:   PATIENT ID: Julie Pugh GENDER: female DOB: 1956/02/12, MRN: [id number]  Chief Complaint  Patient presents with   Follow-up    Bronchiectasis    Reason for Visit: Follow-up. Previously followed by Dr. Craige Cotta  Ms. Marnee Sherrard is a 68 year old female never smoker with OSA, hypothyroidism, HTN, HLD, hx breast cancer s/p left lumpectomy s/p RT and chemo, hx melanoma who presents to follow-up.   Initial new patient visit She was previously seen by Dr. Craige Cotta for regional bronchiectasis and mild OSA with plan for oral appliance. She was seen by Dr. Toni Arthurs and declined $3000 device. She reports good quality sleep except when she lays on her back when she snores. Denies daytime sleepiness or concentration issues. Denies shortness of breath, cough or wheezing. Active and plays tennis  01/16/24 Since our visit she reports overall doing well except for allergies with sneezing, itchy eyes. Denies shortness of breath, cough or wheezing.   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 Asencion Islam managing. armour thyroid 60mg     Melanoma (HCC)    R shin 2009. sees derm q6 months   Personal history of radiation therapy    Varicose vein      Family History  Problem Relation Age of Onset   Panic disorder Mother    COPD Mother        smoked until 28   Congestive Heart Failure Mother        pacemaker   Hypertension Mother    Heart attack Father        bypass 37, 36 fatal MI, former smoker   Prostate cancer Father 43   Hyperlipidemia Sister    Breast cancer Maternal Aunt    Pancreatic cancer Cousin 29     Social History   Occupational History   Not on file  Tobacco Use   Smoking status: Never     Passive exposure: Past   Smokeless tobacco: Never  Vaping Use   Vaping status: Never Used  Substance and Sexual Activity   Alcohol use: Yes    Alcohol/week: 4.0 standard drinks of alcohol    Types: 4 Standard drinks or equivalent per week   Drug use: No   Sexual activity: Yes    Birth control/protection: Post-menopausal    No Known Allergies   Outpatient Medications Prior to Visit  Medication Sig Dispense Refill   amLODipine (NORVASC) 2.5 MG tablet TAKE 1 TABLET(2.5 MG) BY MOUTH DAILY 90 tablet 1   Cholecalciferol (VITAMIN D) 2000 units tablet Take 5,000 Units by mouth daily.     diphenhydramine-acetaminophen (TYLENOL PM) 25-500 MG TABS tablet Take 1 tablet by mouth at bedtime as needed.     letrozole (FEMARA) 2.5 MG tablet Take 1 tablet (2.5 mg total) by mouth daily. 90 tablet 3   levothyroxine (SYNTHROID) 100 MCG tablet TAKE 1 TABLET(100 MCG) BY MOUTH DAILY 90 tablet 3   METRONIDAZOLE, TOPICAL, 0.75 % LOTN      No facility-administered medications prior to visit.    Review of Systems  Constitutional:  Negative for chills, diaphoresis, fever, malaise/fatigue and weight loss.  HENT:  Negative for congestion.   Respiratory:  Negative for cough, hemoptysis, sputum production, shortness of breath and wheezing.   Cardiovascular:  Negative for chest pain, palpitations and leg swelling.     Objective:   Vitals:   01/16/24 1521  BP: 124/82  Pulse: 89  SpO2: 97%  Weight: 155 lb 8 oz (70.5 kg)  Height: 5\' 7"  (1.702 m)   SpO2: 97 %  Physical Exam: General: Well-appearing, no acute distress HENT: [place], AT Eyes: EOMI, no scleral icterus Respiratory: Clear to auscultation bilaterally.  No crackles, wheezing or rales Cardiovascular: RRR, -M/R/G, no JVD Extremities:-Edema,-tenderness Neuro: AAO x4, CNII-XII grossly intact Psych: Normal mood, normal affect  Data Reviewed:  Imaging: CT Chest 10/19/22 - Bronchiectasis with peribronchovascular nodularity and GGO opacity in  RML and lingula. Possible chronic MAC PFT: 11/02/22 FVC 2.78 (87%) FEV1 2.05 (84%) Ratio 73  TLC 109% DLCO 95% Interpretation: No obstructive or restrictive defect. Normal PFTs  Labs: CBC    Component Value Date/Time   WBC 6.2 06/08/2023 1403   RBC 4.35 06/08/2023 1403   HGB 13.2 06/08/2023 1403   HGB 13.0 10/20/2022 0901   HCT 39.9 06/08/2023 1403   PLT 269.0 06/08/2023 1403   PLT 220 10/20/2022 0901   MCV 91.8 06/08/2023 1403   MCH 30.2 12/03/2022 1409   MCHC 33.2 06/08/2023 1403   RDW 12.5 06/08/2023 1403   LYMPHSABS 2.6 06/08/2023 1403   MONOABS 0.6 06/08/2023 1403   EOSABS 0.2 06/08/2023 1403   BASOSABS 0.0 06/08/2023 1403   Absolute eos 06/08/23 - 200     Assessment & Plan:   Discussion: 68 year old female never smoker with OSA, hypothyroidism, HTN, HLD, hx breast cancer s/p left lumpectomy s/p RT and chemo, hx melanoma who presents for follow-up. Overall asymptomatic. Active at baseline. Reviewed CT imaging which is stable. Addressed questions and concerns regarding bronchiectasis. Supportive care recommended including exercise, deep breathing exercises and preventive care including avoiding/minimizing infection. Will continue surveillance imaging for progression of bronchiectasis.  Regional bronchiectasis -Likely related to prior respiratory infection -Asymptomatic -Flutter valve as needed 5-10 inhalations five times a day -Reviewed CT today. Overall stable. -ORDERED CT Chest without contrast for end of Feb 2026  Mild OSA -Hold on treatment since asymptomatic except when she is on back -Recommend positioning to sleep on the side -Encourage good sleep hygiene -Manage good weight range -If symptoms worsen or if you gain 10-20 lb, may need to reconsider treatment  Health Maintenance  There is no immunization history on file for this patient. CT Lung Screen - not qualified  Orders Placed This Encounter  Procedures   CT Chest Wo Contrast    Standing Status:    Future    Expiration Date:   01/15/2025    Scheduling Instructions:     Schedule Feb 2026    Preferred imaging location?:   [facility]  No orders of the defined types were placed in this encounter.   Return in about 6 months (around 07/17/2024).  I have spent a total time of 30-minutes on the day of the appointment including chart review, data review, collecting history, coordinating care and discussing medical diagnosis and plan with the patient/family. Past medical history, allergies, medications were reviewed. Pertinent imaging, labs and tests included in this note have been reviewed and interpreted independently by me.  Iris Hairston Mechele Collin, MD [facility] 01/16/2024 3:36 PM

## 2024-01-16 NOTE — Patient Instructions (Addendum)
 Regional bronchiectasis -Likely related to prior respiratory infection -Asymptomatic -Flutter valve as needed 5-10 inhalations five times a day -Reviewed CT today. Overall stable. -ORDERED CT Chest without contrast for end of Feb 2026

## 2024-02-08 ENCOUNTER — Other Ambulatory Visit: Payer: Self-pay | Admitting: *Deleted

## 2024-02-08 DIAGNOSIS — C50412 Malignant neoplasm of upper-outer quadrant of left female breast: Secondary | ICD-10-CM

## 2024-02-08 NOTE — Progress Notes (Signed)
 Signatera Renewal orders placed.

## 2024-02-10 ENCOUNTER — Other Ambulatory Visit: Payer: Self-pay | Admitting: Hematology and Oncology

## 2024-02-16 DIAGNOSIS — C50412 Malignant neoplasm of upper-outer quadrant of left female breast: Secondary | ICD-10-CM | POA: Diagnosis not present

## 2024-02-16 DIAGNOSIS — Z170 Estrogen receptor positive status [ER+]: Secondary | ICD-10-CM | POA: Diagnosis not present

## 2024-02-24 LAB — SIGNATERA ONLY (NATERA MANAGED)
SIGNATERA MTM READOUT: 0 MTM/ml
SIGNATERA TEST RESULT: NEGATIVE

## 2024-02-27 ENCOUNTER — Telehealth: Payer: Self-pay

## 2024-02-27 NOTE — Telephone Encounter (Signed)
 Attempted to call pt regarding signatera results lvm for pt to return call back. Signatera results was negative and labs will be repeated in the next 6 months.

## 2024-03-01 DIAGNOSIS — M79641 Pain in right hand: Secondary | ICD-10-CM | POA: Diagnosis not present

## 2024-03-01 DIAGNOSIS — M79642 Pain in left hand: Secondary | ICD-10-CM | POA: Diagnosis not present

## 2024-03-12 ENCOUNTER — Ambulatory Visit: Payer: Medicare Other | Attending: General Surgery

## 2024-03-12 VITALS — Wt 150.5 lb

## 2024-03-12 DIAGNOSIS — Z483 Aftercare following surgery for neoplasm: Secondary | ICD-10-CM | POA: Insufficient documentation

## 2024-03-12 NOTE — Therapy (Signed)
 OUTPATIENT PHYSICAL THERAPY SOZO SCREENING NOTE   Patient Name: Julie Pugh MRN: [id number] DOB:April 05, 1956, 68 y.o., female Today's Date: 03/12/2024  PCP: Jess Morita, MD REFERRING PROVIDER: Enid Harry, MD   PT End of Session - 03/12/24 [id number]     Visit Number 2   # unchanged due to screen only   PT Start Time 0953    PT Stop Time 0956    PT Time Calculation (min) 3 min    Activity Tolerance Patient tolerated treatment well    Behavior During Therapy [facility] for tasks assessed/performed             Past Medical History:  Diagnosis Date   Breast cancer (HCC)    Family history of breast cancer    Family history of pancreatic cancer    Family history of prostate cancer    Hx of melanoma of skin    Hyperlipidemia    Hypertension    Hypothyroidism    Dr. Levert Ready managing. armour thyroid  60mg     Melanoma (HCC)    R shin 2009. sees derm q6 months   Personal history of radiation therapy    Varicose vein    Past Surgical History:  Procedure Laterality Date   BREAST BIOPSY     benign 2000-stereotactic biopsy   BREAST BIOPSY Right 03/12/2004   BREAST LUMPECTOMY     BREAST LUMPECTOMY WITH RADIOACTIVE SEED AND SENTINEL LYMPH NODE BIOPSY Left 03/16/2022   Procedure: LEFT BREAST LUMPECTOMY WITH RADIOACTIVE SEED AND AXILLARY SENTINEL LYMPH NODE BIOPSY;  Surgeon: Enid Harry, MD;  Location: [facility];  Service: General;  Laterality: Left;   MENISCUS REPAIR Left 03/2023   VARICOSE VEIN SURGERY     2001   Patient Active Problem List   Diagnosis Date Noted   Acute medial meniscus tear of left knee 02/23/2023   OSA (obstructive sleep apnea) 11/09/2022   Snoring 08/13/2022   Bronchiectasis without complication (HCC) 08/13/2022   HTN (hypertension) 07/04/2022   Physical exam 05/20/2022   Family history of breast cancer 05/05/2022   Family history of pancreatic cancer 05/05/2022   Family history of prostate cancer 05/05/2022   Elevated blood  pressure reading 04/23/2022   Malignant neoplasm of upper-outer quadrant of left breast in female, estrogen receptor positive (HCC) 02/12/2022   History of COVID-19 06/12/2020   Hyperlipidemia 07/10/2015   Hypothyroidism    History of melanoma    Varicose vein     REFERRING DIAG: right breast cancer at risk for lymphedema  THERAPY DIAG:  Aftercare following surgery for neoplasm  PERTINENT HISTORY: Patient was diagnosed on 02/08/22 with left grade 2. It measures less than 1 cm and is located in the upper outer quadrant. It is ER/PR+ (HER2 equivocal - waiting on results) with a Ki67 of 30%. L Lumpectomy and SLNB (0/2) on 03/16/22   PRECAUTIONS: right UE Lymphedema risk  SUBJECTIVE: Here for SOZO screen  PAIN:  Are you having pain? No  SOZO SCREENING: Patient was assessed today using the SOZO machine to determine the lymphedema index score. This was compared to her baseline score. It was determined that she is within the recommended range when compared to her baseline and no further action is needed at this time. She will continue SOZO screenings. These are done every 3 months for 2 years post operatively followed by every 6 months for 2 years, and then annually.   L-DEX FLOWSHEETS - 03/12/24 0900       L-DEX LYMPHEDEMA SCREENING  Measurement Type Unilateral    L-DEX MEASUREMENT EXTREMITY Upper Extremity    POSITION  Standing    DOMINANT SIDE Right    At Risk Side Left    BASELINE SCORE (UNILATERAL) -2.7    L-DEX SCORE (UNILATERAL) -6.1    VALUE CHANGE (UNILAT) -3.4             P: Begin 6 months SOZO's.

## 2024-03-13 ENCOUNTER — Other Ambulatory Visit: Payer: Self-pay

## 2024-03-29 DIAGNOSIS — M79642 Pain in left hand: Secondary | ICD-10-CM | POA: Diagnosis not present

## 2024-03-29 DIAGNOSIS — M79641 Pain in right hand: Secondary | ICD-10-CM | POA: Diagnosis not present

## 2024-04-05 ENCOUNTER — Inpatient Hospital Stay: Attending: Hematology and Oncology | Admitting: Hematology and Oncology

## 2024-04-05 VITALS — BP 131/70 | HR 65 | Temp 98.0°F | Resp 16 | Ht 67.0 in | Wt 151.7 lb

## 2024-04-05 DIAGNOSIS — Z923 Personal history of irradiation: Secondary | ICD-10-CM | POA: Diagnosis not present

## 2024-04-05 DIAGNOSIS — Z780 Asymptomatic menopausal state: Secondary | ICD-10-CM | POA: Diagnosis not present

## 2024-04-05 DIAGNOSIS — Z1731 Human epidermal growth factor receptor 2 positive status: Secondary | ICD-10-CM | POA: Insufficient documentation

## 2024-04-05 DIAGNOSIS — Z170 Estrogen receptor positive status [ER+]: Secondary | ICD-10-CM | POA: Diagnosis not present

## 2024-04-05 DIAGNOSIS — Z9221 Personal history of antineoplastic chemotherapy: Secondary | ICD-10-CM | POA: Diagnosis not present

## 2024-04-05 DIAGNOSIS — Z79811 Long term (current) use of aromatase inhibitors: Secondary | ICD-10-CM | POA: Insufficient documentation

## 2024-04-05 DIAGNOSIS — C50412 Malignant neoplasm of upper-outer quadrant of left female breast: Secondary | ICD-10-CM | POA: Insufficient documentation

## 2024-04-05 DIAGNOSIS — Z79899 Other long term (current) drug therapy: Secondary | ICD-10-CM | POA: Diagnosis not present

## 2024-04-05 DIAGNOSIS — Z1722 Progesterone receptor negative status: Secondary | ICD-10-CM | POA: Diagnosis not present

## 2024-04-05 NOTE — Assessment & Plan Note (Signed)
 02/12/2022:Screening mammogram detected left breast mass, by ultrasound measured 0.6 cm, biopsy revealed grade 2 IDC ER 100%, PR 2%, HER2 equivocal by IHC and FISH positive, Ki-67 30%    Treatment plan: 1.  03/16/2022 Left lumpectomy: Grade 3 IDC 5 mm (6 mm on biopsy), negative for lymphovascular invasion, ER 100%, PR 2%, HER2 FISH positive, margins negative, 0/2 lymph nodes negative 2.  based on only 6 mm tumor size, we discussed the pros and cons of systemic treatments and decided to treat her with Herceptin  (completed 03/31/2023) with letrozole . 3.  Adjuvant radiation completed 05/26/2022 4.  Followed by adjuvant antiestrogen therapy with letrozole  ---------------------------------------------------------------------------- Current treatment: Letrozole  started August 2023 Letrozole  toxicities: Denies any hot flashes or arthralgias   Bone density December 2023:T-score 0: Normal partner Crystal is also a patient of mine.  They are both avid tennis players   Knee pain: Resolved after minor surgery on the knee We will set her up for Signatera testing  Breast cancer surveillance: Breast exam 04/05/2024: Benign Mammogram 01/16/2024: Benign breast density category B  Return to clinic in 1 year for follow-up

## 2024-04-05 NOTE — Progress Notes (Signed)
 Patient Care Team: [name], MD as PCP - General (Family Medicine) Trixie File, MD as Consulting Physician (Internal Medicine) Odean Potts, MD as Medical Oncologist (Hematology and Oncology)  DIAGNOSIS:  Encounter Diagnosis  Name Primary?   Malignant neoplasm of upper-outer quadrant of left breast in female, estrogen receptor positive (HCC) Yes    SUMMARY OF ONCOLOGIC HISTORY: Oncology History  Malignant neoplasm of upper-outer quadrant of left breast in female, estrogen receptor positive (HCC)  02/12/2022 Initial Diagnosis   Screening mammogram detected left breast mass, by ultrasound measured 0.6 cm, biopsy revealed grade 2 IDC ER 100%, PR 2%, HER2 equivocal by IHC and FISH positive, Ki-67 30%   02/17/2022 Cancer Staging   Staging form: Breast, AJCC 8th Edition - Clinical: Stage IA (cT1b, cN0, cM0, G2, ER+, PR-, HER2+) - Signed by Odean Potts, MD on 02/17/2022 Stage prefix: Initial diagnosis Histologic grading system: 3 grade system   03/16/2022 Surgery   Left lumpectomy: Grade 3 IDC 5 mm, negative for lymphovascular invasion, ER 100%, PR 2%, HER2 FISH positive, margins negative, 0/2 lymph nodes negative   03/29/2022 -  Anti-estrogen oral therapy   Letrozole  daily   04/07/2022 - 05/19/2022 Chemotherapy   Patient is on Treatment Plan : BREAST Trastuzumab  q21d x 13 cycles     04/07/2022 - 12/01/2022 Chemotherapy   Patient is on Treatment Plan : BREAST Trastuzumab  IV (8/6) or SQ (600) D1 q21d     04/29/2022 - 05/26/2022 Radiation Therapy   Adjuvant Radiation therapy   05/05/2022 Genetic Testing   Referral pending   05/09/2022 Cancer Staging   Staging form: Breast, AJCC 8th Edition - Pathologic: Stage IA (pT1a, pN0, cM0, G3, ER+, PR-, HER2+) - Signed by Crawford Morna Pickle, NP on 05/09/2022 Histologic grading system: 3 grade system   05/13/2022 Genetic Testing   Negative genetic testing on the CancerNext-Expanded+RNAinsight panel.  The report date is May 13, 2022.  The CancerNext-Expanded gene panel offered by [facility] and includes sequencing and rearrangement analysis for the following 77 genes: AIP, ALK, APC*, ATM*, AXIN2, BAP1, BARD1, BLM, BMPR1A, BRCA1*, BRCA2*, BRIP1*, CDC73, CDH1*, CDK4, CDKN1B, CDKN2A, CHEK2*, CTNNA1, DICER1, FANCC, FH, FLCN, GALNT12, KIF1B, LZTR1, MAX, MEN1, MET, MLH1*, MSH2*, MSH3, MSH6*, MUTYH*, NBN, NF1*, NF2, NTHL1, PALB2*, PHOX2B, PMS2*, POT1, PRKAR1A, PTCH1, PTEN*, RAD51C*, RAD51D*, RB1, RECQL, RET, SDHA, SDHAF2, SDHB, SDHC, SDHD, SMAD4, SMARCA4, SMARCB1, SMARCE1, STK11, SUFU, TMEM127, TP53*, TSC1, TSC2, VHL and XRCC2 (sequencing and deletion/duplication); EGFR, EGLN1, HOXB13, KIT, MITF, PDGFRA, POLD1, and POLE (sequencing only); EPCAM and GREM1 (deletion/duplication only). DNA and RNA analyses performed for * genes.    12/24/2022 -  Chemotherapy   Patient is on Treatment Plan : BREAST MAINTENANCE Trastuzumab  IV (6) or SQ (600) D1 q21d x 13 cycles       CHIEF COMPLIANT: Follow-up on letrozole  therapy  HISTORY OF PRESENT ILLNESS: History of Present Illness Julie Pugh is a 68 year old female who presents for follow-up regarding her letrozole  treatment and arthritis symptoms.  She has been on letrozole  for two years without significant issues. Arthritis symptoms and a trigger finger, attributed to the medication, are present. An injection for her trigger thumb provided relief. Finger stiffness occurs, particularly in the morning, but improves with activity. No significant hot flashes or other side effects from letrozole  are present.  A bone density scan is scheduled for December, following her last scan in December 2023. A recent mammogram on April 7th showed a breast density category of B, indicating less dense  breast tissue.     ALLERGIES:  has no known allergies.  MEDICATIONS:  Current Outpatient Medications  Medication Sig Dispense Refill   amLODipine  (NORVASC ) 2.5 MG tablet TAKE 1 TABLET(2.5 MG) BY  MOUTH DAILY 90 tablet 1   Cholecalciferol (VITAMIN D ) 2000 units tablet Take 5,000 Units by mouth daily.     diphenhydramine -acetaminophen  (TYLENOL  PM) 25-500 MG TABS tablet Take 1 tablet by mouth at bedtime as needed.     letrozole  (FEMARA ) 2.5 MG tablet TAKE 1 TABLET(2.5 MG) BY MOUTH DAILY 90 tablet 1   levothyroxine  (SYNTHROID ) 100 MCG tablet TAKE 1 TABLET(100 MCG) BY MOUTH DAILY 90 tablet 3   METRONIDAZOLE, TOPICAL, 0.75 % LOTN      No current facility-administered medications for this visit.    PHYSICAL EXAMINATION: ECOG PERFORMANCE STATUS: 1 - Symptomatic but completely ambulatory  Vitals:   04/05/24 1506  BP: 131/70  Pulse: 65  Resp: 16  Temp: 98 F (36.7 C)  SpO2: 98%   Filed Weights   04/05/24 1506  Weight: 151 lb 11.2 oz (68.8 kg)    Physical Exam   (exam performed in the presence of a chaperone)  LABORATORY DATA:  I have reviewed the data as listed    Latest Ref Rng & Units 06/08/2023    2:03 PM 12/03/2022    2:09 PM 10/20/2022    9:01 AM  CMP  Glucose 70 - 99 mg/dL 92  84  85   BUN 6 - 23 mg/dL 26  23  19    Creatinine 0.40 - 1.20 mg/dL 9.10  9.07  9.08   Sodium 135 - 145 mEq/L 140  140  139   Potassium 3.5 - 5.1 mEq/L 4.6  4.5  4.0   Chloride 96 - 112 mEq/L 105  105  107   CO2 19 - 32 mEq/L 29  22  29    Calcium 8.4 - 10.5 mg/dL 9.3  9.3  9.1   Total Protein 6.0 - 8.3 g/dL 6.4  6.8  6.8   Total Bilirubin 0.2 - 1.2 mg/dL 0.5  0.5  0.6   Alkaline Phos 39 - 117 U/L 67   63   AST 0 - 37 U/L 16  16  17    ALT 0 - 35 U/L 17  15  18      Lab Results  Component Value Date   WBC 6.2 06/08/2023   HGB 13.2 06/08/2023   HCT 39.9 06/08/2023   MCV 91.8 06/08/2023   PLT 269.0 06/08/2023   NEUTROABS 2.8 06/08/2023    ASSESSMENT & PLAN:  Malignant neoplasm of upper-outer quadrant of left breast in female, estrogen receptor positive (HCC) 02/12/2022:Screening mammogram detected left breast mass, by ultrasound measured 0.6 cm, biopsy revealed grade 2 IDC ER 100%, PR  2%, HER2 equivocal by IHC and FISH positive, Ki-67 30%    Treatment plan: 1.  03/16/2022 Left lumpectomy: Grade 3 IDC 5 mm (6 mm on biopsy), negative for lymphovascular invasion, ER 100%, PR 2%, HER2 FISH positive, margins negative, 0/2 lymph nodes negative 2.  based on only 6 mm tumor size, we discussed the pros and cons of systemic treatments and decided to treat her with Herceptin  (completed 03/31/2023) with letrozole . 3.  Adjuvant radiation completed 05/26/2022 4.  Followed by adjuvant antiestrogen therapy with letrozole  ---------------------------------------------------------------------------- Current treatment: Letrozole  started August 2023 Letrozole  toxicities: Denies any hot flashes or arthralgias   Bone density December 2023:T-score 0: Normal partner Crystal is also a patient of mine.  They are  both avid tennis and pickleball players   Knee pain: Resolved after minor surgery on the knee  Breast cancer surveillance: Breast exam 04/05/2024: Benign Mammogram 01/16/2024: Benign breast density category B Signatera: Negative  Return to clinic in 1 year for follow-up   Assessment & Plan Malignant neoplasm of upper-outer quadrant of left breast, estrogen receptor positive Estrogen receptor-positive breast cancer managed with letrozole  for two years. Bone density category B. Mammogram in April showed no issues. - Continue letrozole  for five to seven years. - Schedule bone density scan after September 21, 2022. - Continue regular mammograms.  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 Managed with injection by Dr. Ortman, providing relief. Symptoms minor, improve with activity.  Bronchiectasis Mild bronchiectasis, asymptomatic. Possible etiology includes past whooping cough or previous COVID-19 infection. - Schedule CT scan in February for bronchiectasis evaluation.      No orders of the defined types were placed in this encounter.  The  patient has a good understanding of the overall plan. she agrees with it. she will call with any problems that may develop before the next visit here. Total time spent: 30 mins including face to face time and time spent for planning, charting and co-ordination of care   Naomi MARLA Chad, MD 04/05/24

## 2024-04-09 ENCOUNTER — Encounter: Payer: Self-pay | Admitting: Hematology and Oncology

## 2024-04-11 DIAGNOSIS — M65312 Trigger thumb, left thumb: Secondary | ICD-10-CM | POA: Insufficient documentation

## 2024-04-24 DIAGNOSIS — M79644 Pain in right finger(s): Secondary | ICD-10-CM | POA: Insufficient documentation

## 2024-04-25 DIAGNOSIS — M79644 Pain in right finger(s): Secondary | ICD-10-CM | POA: Diagnosis not present

## 2024-04-25 DIAGNOSIS — M79645 Pain in left finger(s): Secondary | ICD-10-CM | POA: Diagnosis not present

## 2024-05-01 ENCOUNTER — Other Ambulatory Visit: Payer: Self-pay

## 2024-05-08 DIAGNOSIS — M79645 Pain in left finger(s): Secondary | ICD-10-CM | POA: Diagnosis not present

## 2024-05-08 DIAGNOSIS — M79644 Pain in right finger(s): Secondary | ICD-10-CM | POA: Diagnosis not present

## 2024-05-28 DIAGNOSIS — M79644 Pain in right finger(s): Secondary | ICD-10-CM | POA: Diagnosis not present

## 2024-06-04 DIAGNOSIS — D171 Benign lipomatous neoplasm of skin and subcutaneous tissue of trunk: Secondary | ICD-10-CM | POA: Diagnosis not present

## 2024-06-04 DIAGNOSIS — Q828 Other specified congenital malformations of skin: Secondary | ICD-10-CM | POA: Diagnosis not present

## 2024-06-04 DIAGNOSIS — D2272 Melanocytic nevi of left lower limb, including hip: Secondary | ICD-10-CM | POA: Diagnosis not present

## 2024-06-04 DIAGNOSIS — L821 Other seborrheic keratosis: Secondary | ICD-10-CM | POA: Diagnosis not present

## 2024-06-04 DIAGNOSIS — D2262 Melanocytic nevi of left upper limb, including shoulder: Secondary | ICD-10-CM | POA: Diagnosis not present

## 2024-06-04 DIAGNOSIS — D225 Melanocytic nevi of trunk: Secondary | ICD-10-CM | POA: Diagnosis not present

## 2024-06-04 DIAGNOSIS — I788 Other diseases of capillaries: Secondary | ICD-10-CM | POA: Diagnosis not present

## 2024-06-07 ENCOUNTER — Telehealth: Payer: Self-pay | Admitting: *Deleted

## 2024-06-07 NOTE — Telephone Encounter (Addendum)
 Pt called with c/o joint pain in hands that are continuing to worsen. Currently seeing a hand specialist and preparing for surgery. She is currently taking letrozole  and is aware that these could be some side effects. Advised to stop Letrozole  for 2 weeks and evaluate if s/e are relieved. Advised that f/u is always preferred following the 2 week hold. Pt agreed to be seen on 9/16 with [facility]. She verbalized understanding.

## 2024-06-25 DIAGNOSIS — M654 Radial styloid tenosynovitis [de Quervain]: Secondary | ICD-10-CM | POA: Insufficient documentation

## 2024-06-25 DIAGNOSIS — M65312 Trigger thumb, left thumb: Secondary | ICD-10-CM | POA: Diagnosis not present

## 2024-06-25 DIAGNOSIS — M65311 Trigger thumb, right thumb: Secondary | ICD-10-CM | POA: Diagnosis not present

## 2024-06-26 ENCOUNTER — Inpatient Hospital Stay: Attending: Hematology and Oncology | Admitting: Adult Health

## 2024-06-26 ENCOUNTER — Encounter: Payer: Self-pay | Admitting: Adult Health

## 2024-06-26 VITALS — BP 122/78 | HR 71 | Temp 98.1°F | Resp 17 | Wt 152.0 lb

## 2024-06-26 DIAGNOSIS — Z1731 Human epidermal growth factor receptor 2 positive status: Secondary | ICD-10-CM | POA: Insufficient documentation

## 2024-06-26 DIAGNOSIS — Z170 Estrogen receptor positive status [ER+]: Secondary | ICD-10-CM | POA: Insufficient documentation

## 2024-06-26 DIAGNOSIS — C50412 Malignant neoplasm of upper-outer quadrant of left female breast: Secondary | ICD-10-CM | POA: Insufficient documentation

## 2024-06-26 DIAGNOSIS — Z1721 Progesterone receptor positive status: Secondary | ICD-10-CM | POA: Insufficient documentation

## 2024-06-26 DIAGNOSIS — Z79811 Long term (current) use of aromatase inhibitors: Secondary | ICD-10-CM | POA: Diagnosis not present

## 2024-06-26 DIAGNOSIS — Z923 Personal history of irradiation: Secondary | ICD-10-CM | POA: Diagnosis not present

## 2024-06-26 MED ORDER — ANASTROZOLE 1 MG PO TABS: 1.0000 mg | ORAL_TABLET | Freq: Every day | ORAL | 2 refills | Status: DC

## 2024-06-27 ENCOUNTER — Telehealth: Payer: Self-pay

## 2024-06-27 ENCOUNTER — Other Ambulatory Visit: Payer: Self-pay

## 2024-06-27 DIAGNOSIS — R030 Elevated blood-pressure reading, without diagnosis of hypertension: Secondary | ICD-10-CM

## 2024-06-27 MED ORDER — AMLODIPINE BESYLATE 2.5 MG PO TABS: 2.5000 mg | ORAL_TABLET | Freq: Every day | ORAL | 0 refills | Status: DC

## 2024-06-27 NOTE — Telephone Encounter (Signed)
 error

## 2024-06-28 ENCOUNTER — Encounter: Payer: Self-pay | Admitting: Hematology and Oncology

## 2024-06-28 NOTE — Progress Notes (Signed)
 [facility] Cancer Follow up:    Julie Comer BRAVO, MD [phone] [place] [address]   DIAGNOSIS:  Cancer Staging  Malignant neoplasm of upper-outer quadrant of left breast in female, estrogen receptor positive (HCC) Staging form: Breast, AJCC 8th Edition - Clinical: Stage IA (cT1b, cN0, cM0, G2, ER+, PR-, HER2+) - Signed by Odean Potts, MD on 02/17/2022 Stage prefix: Initial diagnosis Histologic grading system: 3 grade system - Pathologic: Stage IA (pT1a, pN0, cM0, G3, ER+, PR-, HER2+) - Signed by Crawford Morna Pickle, NP on 05/09/2022 Histologic grading system: 3 grade system    SUMMARY OF ONCOLOGIC HISTORY: Oncology History  Malignant neoplasm of upper-outer quadrant of left breast in female, estrogen receptor positive (HCC)  02/12/2022 Initial Diagnosis   Screening mammogram detected left breast mass, by ultrasound measured 0.6 cm, biopsy revealed grade 2 IDC ER 100%, PR 2%, HER2 equivocal by IHC and FISH positive, Ki-67 30%   02/17/2022 Cancer Staging   Staging form: Breast, AJCC 8th Edition - Clinical: Stage IA (cT1b, cN0, cM0, G2, ER+, PR-, HER2+) - Signed by Odean Potts, MD on 02/17/2022 Stage prefix: Initial diagnosis Histologic grading system: 3 grade system   03/16/2022 Surgery   Left lumpectomy: Grade 3 IDC 5 mm, negative for lymphovascular invasion, ER 100%, PR 2%, HER2 FISH positive, margins negative, 0/2 lymph nodes negative   03/29/2022 -  Anti-estrogen oral therapy   Letrozole  daily   04/07/2022 - 05/19/2022 Chemotherapy   Patient is on Treatment Plan : BREAST Trastuzumab  q21d x 13 cycles     04/07/2022 - 12/01/2022 Chemotherapy   Patient is on Treatment Plan : BREAST Trastuzumab  IV (8/6) or SQ (600) D1 q21d     04/29/2022 - 05/26/2022 Radiation Therapy   Adjuvant Radiation therapy   05/05/2022 Genetic Testing   Referral pending   05/09/2022 Cancer Staging   Staging form: Breast, AJCC 8th Edition - Pathologic: Stage IA (pT1a, pN0,  cM0, G3, ER+, PR-, HER2+) - Signed by Crawford Morna Pickle, NP on 05/09/2022 Histologic grading system: 3 grade system   05/13/2022 Genetic Testing   Negative genetic testing on the CancerNext-Expanded+RNAinsight panel.  The report date is May 13, 2022.  The CancerNext-Expanded gene panel offered by [facility] and includes sequencing and rearrangement analysis for the following 77 genes: AIP, ALK, APC*, ATM*, AXIN2, BAP1, BARD1, BLM, BMPR1A, BRCA1*, BRCA2*, BRIP1*, CDC73, CDH1*, CDK4, CDKN1B, CDKN2A, CHEK2*, CTNNA1, DICER1, FANCC, FH, FLCN, GALNT12, KIF1B, LZTR1, MAX, MEN1, MET, MLH1*, MSH2*, MSH3, MSH6*, MUTYH*, NBN, NF1*, NF2, NTHL1, PALB2*, PHOX2B, PMS2*, POT1, PRKAR1A, PTCH1, PTEN*, RAD51C*, RAD51D*, RB1, RECQL, RET, SDHA, SDHAF2, SDHB, SDHC, SDHD, SMAD4, SMARCA4, SMARCB1, SMARCE1, STK11, SUFU, TMEM127, TP53*, TSC1, TSC2, VHL and XRCC2 (sequencing and deletion/duplication); EGFR, EGLN1, HOXB13, KIT, MITF, PDGFRA, POLD1, and POLE (sequencing only); EPCAM and GREM1 (deletion/duplication only). DNA and RNA analyses performed for * genes.    12/24/2022 -  Chemotherapy   Patient is on Treatment Plan : BREAST MAINTENANCE Trastuzumab  IV (6) or SQ (600) D1 q21d x 13 cycles       CURRENT THERAPY: Letrozole   INTERVAL HISTORY:  Discussed the use of AI scribe software for clinical note transcription with the patient, who gave verbal consent to proceed.  History of Present Illness Julie Pugh is a 68 year old female with stage one ER positive, HER2 positive, left breast invasive ductal carcinoma who presents with joint pain in her hands.  She experiences joint pain in her hands, which she attributes to  letrozole  use. She discontinued letrozole  on June 08, 2024, and has noted improvement in symptoms since then. The pain causes 'claw hands' and affects her right pinky toe at night. She has been diagnosed with trigger finger and De Quervain's tenosynovitis and received a cortisone injection  yesterday, which provides temporary relief.  Her breast cancer was diagnosed in May 2023, and she underwent a lumpectomy, Herceptin  treatment for a year, adjuvant radiation, and started letrozole  in June 2023. She has not tried other aromatase inhibitors.   Patient Active Problem List   Diagnosis Date Noted   Acute medial meniscus tear of left knee 02/23/2023   OSA (obstructive sleep apnea) 11/09/2022   Snoring 08/13/2022   Bronchiectasis without complication (HCC) 08/13/2022   HTN (hypertension) 07/04/2022   Physical exam 05/20/2022   Family history of breast cancer 05/05/2022   Family history of pancreatic cancer 05/05/2022   Family history of prostate cancer 05/05/2022   Elevated blood pressure reading 04/23/2022   Malignant neoplasm of upper-outer quadrant of left breast in female, estrogen receptor positive (HCC) 02/12/2022   History of COVID-19 06/12/2020   Hyperlipidemia 07/10/2015   Hypothyroidism    History of melanoma    Varicose vein     has no known allergies.  MEDICAL HISTORY: Past Medical History:  Diagnosis Date   Breast cancer (HCC)    Family history of breast cancer    Family history of pancreatic cancer    Family history of prostate cancer    Hx of melanoma of skin    Hyperlipidemia    Hypertension    Hypothyroidism    Dr. Edna managing. armour thyroid  60mg     Melanoma (HCC)    R shin 2009. sees derm q6 months   Personal history of radiation therapy    Varicose vein     SURGICAL HISTORY: Past Surgical History:  Procedure Laterality Date   BREAST BIOPSY     benign 2000-stereotactic biopsy   BREAST BIOPSY Right 03/12/2004   BREAST LUMPECTOMY     BREAST LUMPECTOMY WITH RADIOACTIVE SEED AND SENTINEL LYMPH NODE BIOPSY Left 03/16/2022   Procedure: LEFT BREAST LUMPECTOMY WITH RADIOACTIVE SEED AND AXILLARY SENTINEL LYMPH NODE BIOPSY;  Surgeon: Ebbie Cough, MD;  Location: [facility];  Service: General;  Laterality: Left;   MENISCUS  REPAIR Left 03/2023   VARICOSE VEIN SURGERY     2001    SOCIAL HISTORY: Social History   Socioeconomic History   Marital status: Married    Spouse name: Not on file   Number of children: Not on file   Years of education: Not on file   Highest education level: Bachelor's degree (e.g., BA, AB, BS)  Occupational History   Not on file  Tobacco Use   Smoking status: Never    Passive exposure: Past   Smokeless tobacco: Never  Vaping Use   Vaping status: Never Used  Substance and Sexual Activity   Alcohol use: Yes    Alcohol/week: 4.0 standard drinks of alcohol    Types: 4 Standard drinks or equivalent per week   Drug use: No   Sexual activity: Yes    Birth control/protection: Post-menopausal  Other Topics Concern   Not on file  Social History Narrative   Married (marriage is a stressor). 1 daughter Geofm at [employer] age 68 in 2016.       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       Hobbies: tennis, walking, time  with dogs, pickleball. Reading. Outdoors.    Social Drivers of Corporate investment banker Strain: Low Risk  (06/07/2023)   Overall Financial Resource Strain (CARDIA)    Difficulty of Paying Living Expenses: Not hard at all  Food Insecurity: No Food Insecurity (06/07/2023)   Hunger Vital Sign    Worried About Running Out of Food in the Last Year: Never true    Ran Out of Food in the Last Year: Never true  Transportation Needs: No Transportation Needs (06/07/2023)   PRAPARE - Administrator, Civil Service (Medical): No    Lack of Transportation (Non-Medical): No  Physical Activity: Sufficiently Active (06/07/2023)   Exercise Vital Sign    Days of Exercise per Week: 5 days    Minutes of Exercise per Session: 70 min  Stress: No Stress Concern Present (06/07/2023)   [employer] of Occupational Health - Occupational Stress Questionnaire    Feeling of Stress : Not at all  Social Connections: Socially  Integrated (06/07/2023)   Social Connection and Isolation Panel    Frequency of Communication with Friends and Family: More than three times a week    Frequency of Social Gatherings with Friends and Family: Three times a week    Attends Religious Services: More than 4 times per year    Active Member of Clubs or Organizations: Yes    Attends Engineer, structural: More than 4 times per year    Marital Status: Married  Catering manager Violence: Not on file    FAMILY HISTORY: Family History  Problem Relation Age of Onset   Panic disorder Mother    COPD Mother        smoked until 16   Congestive Heart Failure Mother        pacemaker   Hypertension Mother    Heart attack Father        bypass 19, 59 fatal MI, former smoker   Prostate cancer Father 46   Hyperlipidemia Sister    Breast cancer Maternal Aunt    Pancreatic cancer Cousin 69    Review of Systems  Constitutional:  Negative for appetite change, chills, fatigue, fever and unexpected weight change.  HENT:   Negative for hearing loss, lump/mass and trouble swallowing.   Eyes:  Negative for eye problems and icterus.  Respiratory:  Negative for chest tightness, cough and shortness of breath.   Cardiovascular:  Negative for chest pain, leg swelling and palpitations.  Gastrointestinal:  Negative for abdominal distention, abdominal pain, constipation, diarrhea, nausea and vomiting.  Endocrine: Negative for hot flashes.  Genitourinary:  Negative for difficulty urinating.   Musculoskeletal:  Positive for arthralgias.  Skin:  Negative for itching and rash.  Neurological:  Negative for dizziness, extremity weakness, headaches and numbness.  Hematological:  Negative for adenopathy. Does not bruise/bleed easily.  Psychiatric/Behavioral:  Negative for depression. The patient is not nervous/anxious.       PHYSICAL EXAMINATION    Vitals:   06/26/24 1216 06/26/24 1240  BP: (!) 148/65 122/78  Pulse: 71   Resp: 17   Temp:  98.1 F (36.7 C)   SpO2: 100%     Physical Exam Constitutional:      General: She is not in acute distress.    Appearance: Normal appearance. She is not toxic-appearing.  HENT:     Head: Normocephalic and atraumatic.  Eyes:     General: No scleral icterus. Skin:    General: Skin is warm and dry.  Findings: No rash.  Neurological:     General: No focal deficit present.     Mental Status: She is alert.  Psychiatric:        Mood and Affect: Mood normal.        Behavior: Behavior normal.    ASSESSMENT and THERAPY PLAN:   Malignant neoplasm of upper-outer quadrant of left breast in female, estrogen receptor positive (HCC) 02/12/2022:Screening mammogram detected left breast mass, by ultrasound measured 0.6 cm, biopsy revealed grade 2 IDC ER 100%, PR 2%, HER2 equivocal by IHC and FISH positive, Ki-67 30%    Treatment plan: 1.  03/16/2022 Left lumpectomy: Grade 3 IDC 5 mm (6 mm on biopsy), negative for lymphovascular invasion, ER 100%, PR 2%, HER2 FISH positive, margins negative, 0/2 lymph nodes negative 2.  based on only 6 mm tumor size, we discussed the pros and cons of systemic treatments and decided to treat her with Herceptin  (completed 03/31/2023) with letrozole . 3.  Adjuvant radiation completed 05/26/2022 4.  Followed by adjuvant antiestrogen therapy with letrozole  ---------------------------------------------------------------------------- Current treatment: Letrozole  started August 2023-August 2025, changing to Anastrozole  06/2024  Assessment and Plan Assessment & Plan Stage I ER positive, HER2 positive left breast invasive ductal carcinoma Status post lumpectomy, Herceptin , adjuvant radiation, and letrozole . Letrozole  discontinued due to hand joint pain. Anastrozole  discussed as alternative. Emphasized importance of continuing aromatase inhibitor therapy. Side effects of anastrozole  similar to letrozole , but tolerance varies. - Start anastrozole  one week from today. - Monitor  for side effects and adjust treatment as needed. - Schedule follow-up in three months to assess tolerance and effectiveness of anastrozole .  Hand joint pain, trigger finger, and De Quervain's tenosynovitis Symptoms likely exacerbated by letrozole , improved after discontinuation. Considering surgical intervention for trigger finger. Discussed potential use of turmeric and omega-3 for inflammation management. - Stay off letrozole  for another week. - Consider starting anastrozole  if symptoms persist. - Use turmeric and omega-3 supplements as per recommendations.  RTC in 3 months for f/u to review tolerance of Anastrozole .    All questions were answered. The patient knows to call the clinic with any problems, questions or concerns. We can certainly see the patient much sooner if necessary.  Total encounter time:30 minutes*in face-to-face visit time, chart review, lab review, care coordination, order entry, and documentation of the encounter time.    Morna Kendall, NP 06/28/24 1:03 PM Medical Oncology and Hematology [facility] [address] [place], [place] [address] Tel. [phone]    Fax. [phone]  *Total Encounter Time as defined by the Centers for Medicare and Medicaid Services includes, in addition to the face-to-face time of a patient visit (documented in the note above) non-face-to-face time: obtaining and reviewing outside history, ordering and reviewing medications, tests or procedures, care coordination (communications with other health care professionals or caregivers) and documentation in the medical record.

## 2024-06-28 NOTE — Assessment & Plan Note (Signed)
 02/12/2022:Screening mammogram detected left breast mass, by ultrasound measured 0.6 cm, biopsy revealed grade 2 IDC ER 100%, PR 2%, HER2 equivocal by IHC and FISH positive, Ki-67 30%    Treatment plan: 1.  03/16/2022 Left lumpectomy: Grade 3 IDC 5 mm (6 mm on biopsy), negative for lymphovascular invasion, ER 100%, PR 2%, HER2 FISH positive, margins negative, 0/2 lymph nodes negative 2.  based on only 6 mm tumor size, we discussed the pros and cons of systemic treatments and decided to treat her with Herceptin  (completed 03/31/2023) with letrozole . 3.  Adjuvant radiation completed 05/26/2022 4.  Followed by adjuvant antiestrogen therapy with letrozole  ---------------------------------------------------------------------------- Current treatment: Letrozole  started August 2023-August 2025, changing to Anastrozole  06/2024  Assessment and Plan Assessment & Plan Stage I ER positive, HER2 positive left breast invasive ductal carcinoma Status post lumpectomy, Herceptin , adjuvant radiation, and letrozole . Letrozole  discontinued due to hand joint pain. Anastrozole  discussed as alternative. Emphasized importance of continuing aromatase inhibitor therapy. Side effects of anastrozole  similar to letrozole , but tolerance varies. - Start anastrozole  one week from today. - Monitor for side effects and adjust treatment as needed. - Schedule follow-up in three months to assess tolerance and effectiveness of anastrozole .  Hand joint pain, trigger finger, and De Quervain's tenosynovitis Symptoms likely exacerbated by letrozole , improved after discontinuation. Considering surgical intervention for trigger finger. Discussed potential use of turmeric and omega-3 for inflammation management. - Stay off letrozole  for another week. - Consider starting anastrozole  if symptoms persist. - Use turmeric and omega-3 supplements as per recommendations.  RTC in 3 months for f/u to review tolerance of Anastrozole .

## 2024-07-03 ENCOUNTER — Encounter: Payer: Self-pay | Admitting: Internal Medicine

## 2024-07-03 ENCOUNTER — Ambulatory Visit: Payer: Medicare Other | Admitting: Internal Medicine

## 2024-07-03 VITALS — BP 118/62 | HR 85 | Resp 20 | Ht 67.0 in | Wt 151.0 lb

## 2024-07-03 DIAGNOSIS — E039 Hypothyroidism, unspecified: Secondary | ICD-10-CM

## 2024-07-03 DIAGNOSIS — E559 Vitamin D deficiency, unspecified: Secondary | ICD-10-CM | POA: Diagnosis not present

## 2024-07-03 NOTE — Patient Instructions (Signed)
Please continue Levothyroxine 100 mcg daily.  Take the thyroid hormone every day, with water, at least 30 minutes before breakfast, separated by at least 4 hours from: - acid reflux medications - calcium - iron - multivitamins  Please continue 5000 units vitamin D daily.  Please stop at the lab.  Please return in 1 year.

## 2024-07-03 NOTE — Progress Notes (Signed)
 Patient ID: Julie Pugh, female   DOB: 1956-04-20, 68 y.o.   MRN: [id number]  HPI  Julie Pugh is a 68 y.o. female, returning for f/u for hypothyroidism and vitamin D  insufficiency. Last visit 1 year ago.  Interim history: She continues to stay active active, w/o pain, unless she exerts herself too much.  She is playing tennis, also pickleball. She has deQuervain tenosynovitis  - steroid inj 1 mo ago. She is off Letrozole  x3 weeks for now. She will restart Anastrozole . Is still playing tennis and pickleball.  She has a tournament coming up.   Reviewed history: Pt. has been dx with hypothyroidism in 2012 (foggy mind, weight gain, fatigue); is on Armour 60 mg (equivalent of Levothyroxine  100 mcg), but was off 2016 Summer >> TSH in the Fall of 2016: 3.33 >> repeat TSH 1.83.  She was then restarted on Armour with maintenance of normal TFTs (?).  She changed to LT4 in 11/2019 per insurance preference.  She did not feel a difference after this change.  Pt is on Levothyroxine  100 daily, taken: - in am (5-6 AM) - fasting - at least 30 min (usually 1 h) from b'fast - no Ca, Fe, PPIs - + occasional multivitamins at night  - not on Biotin - on vitamin D , Turmeric and ginger  Reviewed patient TFTs: Lab Results  Component Value Date   TSH 1.05 06/08/2023   TSH 1.90 12/03/2022   TSH 2.12 06/24/2022   TSH 1.88 10/22/2021   TSH 2.21 06/11/2021   TSH 2.03 10/08/2020   TSH 1.83 03/06/2020   TSH 3.53 08/31/2019   TSH 1.46 06/04/2019   TSH 1.58 05/10/2018   TSH 2.63 09/28/2017   TSH 0.97 02/21/2017   TSH 1.31 05/25/2016   TSH 1.31 11/26/2015   TSH 1.83 09/29/2015   Lab Results  Component Value Date   FREET4 0.94 06/24/2022   FREET4 1.03 06/11/2021   FREET4 1.21 03/06/2020   FREET4 0.91 06/04/2019   FREET4 0.75 05/10/2018   FREET4 0.74 02/21/2017   FREET4 0.83 05/25/2016   FREET4 0.82 11/26/2015   Lab Results  Component Value Date   T3FREE 3.4 06/04/2019   T3FREE 3.8  05/10/2018   T3FREE 4.2 02/21/2017   T3FREE 3.8 05/25/2016   T3FREE 3.4 11/26/2015   T3FREE 2.7 06/28/2014   Pt denies: - feeling nodules in neck - hoarseness - dysphagia - choking  No FH of thyroid  ds. or thyroid  cancer. No h/o radiation tx to head or neck. No Biotin use.   Vitamin D  def:  Reviewed vitamin D  levels: Lab Results  Component Value Date   VD25OH 62.37 06/30/2023   VD25OH 46.12 06/24/2022   VD25OH 50.39 10/22/2021   VD25OH 53.6 06/11/2021   VD25OH 53.58 10/08/2020   VD25OH 52.3 03/06/2020   VD25OH 32.51 08/31/2019   VD25OH 27.37 (L) 06/04/2019   VD25OH 35.51 09/28/2017   VD25OH 51.77 02/21/2017   On 5000 units vitamin D  daily + MVI.  She stopped HRT 02/2015. 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  She refused to restart a statin. In 2023, she was diagnosed with breast cancer.  This was stage I, HER2 positive.  She had surgery and then RxTx. In 2023, she was found to have a higher BP >> started Amlodipine  low dose. She was diagnosed with bronchiectasis Spring 2024.  ROS: + see HPI  I reviewed pt's medications, allergies, PMH, social hx, family hx, and changes were documented in the history of present  illness. Otherwise, unchanged from my initial visit note.  Past Medical History:  Diagnosis Date   Breast cancer (HCC)    Family history of breast cancer    Family history of pancreatic cancer    Family history of prostate cancer    Hx of melanoma of skin    Hyperlipidemia    Hypertension    Hypothyroidism    Dr. Edna managing. armour thyroid  60mg     Melanoma (HCC)    R shin 2009. sees derm q6 months   Personal history of radiation therapy    Varicose vein    Past Surgical History:  Procedure Laterality Date   BREAST BIOPSY     benign 2000-stereotactic biopsy   BREAST BIOPSY Right 03/12/2004   BREAST LUMPECTOMY     BREAST LUMPECTOMY WITH RADIOACTIVE SEED AND SENTINEL LYMPH NODE BIOPSY Left 03/16/2022   Procedure: LEFT BREAST  LUMPECTOMY WITH RADIOACTIVE SEED AND AXILLARY SENTINEL LYMPH NODE BIOPSY;  Surgeon: Ebbie Cough, MD;  Location: [facility];  Service: General;  Laterality: Left;   MENISCUS REPAIR Left 03/2023   VARICOSE VEIN SURGERY     2001   Social History   Socioeconomic History   Marital status: Married    Spouse name: Not on file   Number of children: Not on file   Years of education: Not on file   Highest education level: Bachelor's degree (e.g., BA, AB, BS)  Occupational History   Not on file  Tobacco Use   Smoking status: Never    Passive exposure: Past   Smokeless tobacco: Never  Vaping Use   Vaping status: Never Used  Substance and Sexual Activity   Alcohol use: Yes    Alcohol/week: 4.0 standard drinks of alcohol    Types: 4 Standard drinks or equivalent per week   Drug use: No   Sexual activity: Yes    Birth control/protection: Post-menopausal  Other Topics Concern   Not on file  Social History Narrative   Married (marriage is a stressor). 1 daughter Geofm at [employer] age 110 in 2016.       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       Hobbies: tennis, walking, time with dogs, pickleball. Reading. Outdoors.    Social Drivers of Corporate investment banker Strain: Low Risk  (06/07/2023)   Overall Financial Resource Strain (CARDIA)    Difficulty of Paying Living Expenses: Not hard at all  Food Insecurity: No Food Insecurity (06/07/2023)   Hunger Vital Sign    Worried About Running Out of Food in the Last Year: Never true    Ran Out of Food in the Last Year: Never true  Transportation Needs: No Transportation Needs (06/07/2023)   PRAPARE - Administrator, Civil Service (Medical): No    Lack of Transportation (Non-Medical): No  Physical Activity: Sufficiently Active (06/07/2023)   Exercise Vital Sign    Days of Exercise per Week: 5 days    Minutes of Exercise per Session: 70 min  Stress: No Stress  Concern Present (06/07/2023)   [employer] of Occupational Health - Occupational Stress Questionnaire    Feeling of Stress : Not at all  Social Connections: Socially Integrated (06/07/2023)   Social Connection and Isolation Panel    Frequency of Communication with Friends and Family: More than three times a week    Frequency of Social Gatherings with Friends and Family: Three times a week  Attends Religious Services: More than 4 times per year    Active Member of Clubs or Organizations: Yes    Attends Banker Meetings: More than 4 times per year    Marital Status: Married  Catering manager Violence: Not on file   Current Outpatient Medications on File Prior to Visit  Medication Sig Dispense Refill   amLODipine  (NORVASC ) 2.5 MG tablet Take 1 tablet (2.5 mg total) by mouth daily. 30 tablet 0   anastrozole  (ARIMIDEX ) 1 MG tablet Take 1 tablet (1 mg total) by mouth daily. 30 tablet 2   Cholecalciferol (VITAMIN D ) 2000 units tablet Take 5,000 Units by mouth daily.     diphenhydramine -acetaminophen  (TYLENOL  PM) 25-500 MG TABS tablet Take 1 tablet by mouth at bedtime as needed.     levothyroxine  (SYNTHROID ) 100 MCG tablet TAKE 1 TABLET(100 MCG) BY MOUTH DAILY 90 tablet 3   METRONIDAZOLE, TOPICAL, 0.75 % LOTN      No current facility-administered medications on file prior to visit.   No Known Allergies Family History  Problem Relation Age of Onset   Panic disorder Mother    COPD Mother        smoked until 79   Congestive Heart Failure Mother        pacemaker   Hypertension Mother    Heart attack Father        bypass 62, 61 fatal MI, former smoker   Prostate cancer Father 94   Hyperlipidemia Sister    Breast cancer Maternal Aunt    Pancreatic cancer Cousin 28   PE: BP 118/62   Pulse 85   Resp 20   Ht 5' 7 (1.702 m)   Wt 151 lb (68.5 kg)   SpO2 96%   BMI 23.65 kg/m  Wt Readings from Last 3 Encounters:  07/03/24 151 lb (68.5 kg)  06/26/24 152 lb (68.9 kg)   04/05/24 151 lb 11.2 oz (68.8 kg)   Constitutional: normal weight, in NAD Eyes:  EOMI, no exophthalmos ENT: no neck masses, no cervical lymphadenopathy Cardiovascular: RRR, No MRG Respiratory: CTA B Musculoskeletal: no deformities Skin:no rashes Neurological: no tremor with outstretched hands  ASSESSMENT: 1. Hypothyroidism  2. Vitamin D  insufficiency  PLAN:  1. Patient with history of hypothyroidism, previously on desiccated thyroid  extract, 60 mg daily, but we had to change to levothyroxine  per insurance preference.  She did not feel different after changing formulation. - latest thyroid  labs reviewed with pt. >> normal: Lab Results  Component Value Date   TSH 1.05 06/08/2023  - she continues on LT4 100 mcg daily - pt feels good on this dose.  She previously had more fatigue but this was related to radiotherapy.  She also had headaches, but these resolved.  At today's visit, she does not have complaints. - we discussed about taking the thyroid  hormone every day, with water, >30 minutes before breakfast, separated by >4 hours from acid reflux medications, calcium, iron, multivitamins. Pt. is taking it correctly. - will check thyroid  tests today: TSH and fT4. She  had bilateral breast steroid injections approximately a week ago and we discussed that these may influence the thyroid  tests, but the injections were in small joints so they are less likely to persist in her system compared to a larger joint injection.  She also stopped letrozole  since last visit and this could have a small effect on her TFTs, also.  - If labs are abnormal, she will need to return for repeat TFTs in 1.5 months.  -  OTW, plan to see her back in a year  2. Vit D insufficiency - Continues on vitamin D  5000 units + a daily multivitamin - Latest vitamin D  level was excellent at last visit, at 62.37 - We will repeat the level today  Needs refills.  Orders Placed This Encounter  Procedures   TSH   T4, free    VITAMIN D  25 Hydroxy (Vit-D Deficiency, Fractures)   Lela Fendt, MD PhD [facility] Endocrinology

## 2024-07-04 ENCOUNTER — Ambulatory Visit: Payer: Self-pay | Admitting: Internal Medicine

## 2024-07-04 ENCOUNTER — Other Ambulatory Visit: Payer: Self-pay

## 2024-07-04 LAB — T4, FREE: Free T4: 1.6 ng/dL (ref 0.8–1.8)

## 2024-07-04 LAB — TSH: TSH: 1.42 m[IU]/L (ref 0.40–4.50)

## 2024-07-04 LAB — VITAMIN D 25 HYDROXY (VIT D DEFICIENCY, FRACTURES): Vit D, 25-Hydroxy: 68 ng/mL (ref 30–100)

## 2024-07-04 MED ORDER — LEVOTHYROXINE SODIUM 100 MCG PO TABS: ORAL_TABLET | ORAL | 3 refills | Status: AC

## 2024-07-04 NOTE — Addendum Note (Signed)
 Addended by: TRIXIE FILE on: 07/04/2024 12:10 PM   Modules accepted: Orders

## 2024-08-02 ENCOUNTER — Other Ambulatory Visit: Payer: Self-pay | Admitting: Family Medicine

## 2024-08-02 DIAGNOSIS — R030 Elevated blood-pressure reading, without diagnosis of hypertension: Secondary | ICD-10-CM

## 2024-08-02 NOTE — Telephone Encounter (Signed)
 Pt has been made an appointment and was sent a short refill to get to that encounter

## 2024-08-08 ENCOUNTER — Other Ambulatory Visit: Payer: Self-pay | Admitting: *Deleted

## 2024-08-08 DIAGNOSIS — C50412 Malignant neoplasm of upper-outer quadrant of left female breast: Secondary | ICD-10-CM

## 2024-08-26 LAB — SIGNATERA
SIGNATERA MTM READOUT: 0 MTM/ml
SIGNATERA TEST RESULT: NEGATIVE

## 2024-08-28 ENCOUNTER — Ambulatory Visit (INDEPENDENT_AMBULATORY_CARE_PROVIDER_SITE_OTHER): Admitting: Family Medicine

## 2024-08-28 VITALS — BP 134/60 | HR 78 | Temp 97.8°F | Ht 67.0 in | Wt 155.0 lb

## 2024-08-28 DIAGNOSIS — L719 Rosacea, unspecified: Secondary | ICD-10-CM | POA: Insufficient documentation

## 2024-08-28 DIAGNOSIS — I10 Essential (primary) hypertension: Secondary | ICD-10-CM | POA: Diagnosis not present

## 2024-08-28 DIAGNOSIS — Z0000 Encounter for general adult medical examination without abnormal findings: Secondary | ICD-10-CM | POA: Diagnosis not present

## 2024-08-28 DIAGNOSIS — D239 Other benign neoplasm of skin, unspecified: Secondary | ICD-10-CM | POA: Insufficient documentation

## 2024-08-28 MED ORDER — AMLODIPINE BESYLATE 2.5 MG PO TABS: 2.5000 mg | ORAL_TABLET | Freq: Every day | ORAL | 1 refills | Status: AC

## 2024-08-28 NOTE — Patient Instructions (Addendum)
 Follow up in 6 months to recheck blood pressure and cholesterol We'll notify you of your lab results and make any changes if needed Continue to work on healthy diet and regular exercise- you're doing great! Call with any questions or concerns Stay Safe!  Stay Healthy! Happy Holidays!!!

## 2024-08-28 NOTE — Progress Notes (Signed)
   Subjective:    Patient ID: Julie Pugh, female    DOB: 06-12-1956, 68 y.o.   MRN: [id number]  HPI CPE- UTD on mammo, colonosocpy, DEXA.  Declines PNA, flu.  Exercising regularly.  Health Maintenance  Topic Date Due   Medicare Annual Wellness (AWV)  Never done   Pneumococcal Vaccine: 50+ Years (1 of 2 - PCV) Never done   Influenza Vaccine  Never done   Hepatitis C Screening  10/07/2098 (Originally 01/[birth year]/1972)   Mammogram  01/15/2025   Colonoscopy  07/18/2029   DEXA SCAN  Completed   Meningococcal B Vaccine  Aged Out   DTaP/Tdap/Td  Discontinued   COVID-19 Vaccine  Discontinued   Zoster Vaccines- Shingrix  Discontinued    Patient Care Team    Relationship Specialty Notifications Start End  [name], MD PCP - General Family Medicine  01/30/16   Trixie File, MD Consulting Physician Internal Medicine  04/02/16   Odean Potts, MD Medical Oncologist Hematology and Oncology  02/17/22       Review of Systems Patient reports no vision/ hearing changes, adenopathy,fever, weight change,  persistant/recurrent hoarseness , swallowing issues, chest pain, palpitations, edema, persistant/recurrent cough, hemoptysis, dyspnea (rest/exertional/paroxysmal nocturnal), gastrointestinal bleeding (melena, rectal bleeding), abdominal pain, significant heartburn, bowel changes, GU symptoms (dysuria, hematuria, incontinence), Gyn symptoms (abnormal  bleeding, pain),  syncope, focal weakness, memory loss, skin/hair/nail changes, abnormal bruising or bleeding, anxiety, or depression.   + numbness due to chemo    Objective:   Physical Exam General Appearance:    Alert, cooperative, no distress, appears stated age  Head:    Normocephalic, without obvious abnormality, atraumatic  Eyes:    PERRL, conjunctiva/corneas clear, EOM's intact both eyes  Ears:    Normal TM's and external ear canals, both ears  Nose:   Nares normal, septum midline, mucosa normal, no drainage    or sinus tenderness   Throat:   Lips, mucosa, and tongue normal; teeth and gums normal  Neck:   Supple, symmetrical, trachea midline, no adenopathy;    Thyroid : no enlargement/tenderness/nodules  Back:     Symmetric, no curvature, ROM normal, no CVA tenderness  Lungs:     Clear to auscultation bilaterally, respirations unlabored  Chest Wall:    No tenderness or deformity   Heart:    Regular rate and rhythm, S1 and S2 normal, no murmur, rub   or gallop  Breast Exam:    Deferred to mammo  Abdomen:     Soft, non-tender, bowel sounds active all four quadrants,    no masses, no organomegaly  Genitalia:    Deferred  Rectal:    Extremities:   Extremities normal, atraumatic, no cyanosis or edema  Pulses:   2+ and symmetric all extremities  Skin:   Skin color, texture, turgor normal, no rashes or lesions  Lymph nodes:   Cervical, supraclavicular, and axillary nodes normal  Neurologic:   CNII-XII intact, normal strength, sensation and reflexes    throughout          Assessment & Plan:

## 2024-08-28 NOTE — Assessment & Plan Note (Signed)
Pt's PE WNL.  UTD on mammo, colonoscopy, DEXA.  Declines flu and PNA vaccines.  Check labs.  Anticipatory guidance provided.

## 2024-08-29 ENCOUNTER — Ambulatory Visit: Payer: Self-pay | Admitting: Family Medicine

## 2024-08-29 ENCOUNTER — Encounter: Payer: Self-pay | Admitting: Hematology and Oncology

## 2024-08-29 LAB — CBC WITH DIFFERENTIAL/PLATELET
Basophils Absolute: 0.1 K/uL (ref 0.0–0.1)
Basophils Relative: 0.8 % (ref 0.0–3.0)
Eosinophils Absolute: 0 K/uL (ref 0.0–0.7)
Eosinophils Relative: 0.1 % (ref 0.0–5.0)
HCT: 42 % (ref 36.0–46.0)
Hemoglobin: 14 g/dL (ref 12.0–15.0)
Lymphocytes Relative: 23.8 % (ref 12.0–46.0)
Lymphs Abs: 2.1 K/uL (ref 0.7–4.0)
MCHC: 33.3 g/dL (ref 30.0–36.0)
MCV: 91.6 fl (ref 78.0–100.0)
Monocytes Absolute: 0.7 K/uL (ref 0.1–1.0)
Monocytes Relative: 7.3 % (ref 3.0–12.0)
Neutro Abs: 6.1 K/uL (ref 1.4–7.7)
Neutrophils Relative %: 68 % (ref 43.0–77.0)
Platelets: 301 K/uL (ref 150.0–400.0)
RBC: 4.58 Mil/uL (ref 3.87–5.11)
RDW: 13.2 % (ref 11.5–15.5)
WBC: 9 K/uL (ref 4.0–10.5)

## 2024-08-29 LAB — BASIC METABOLIC PANEL WITH GFR
BUN: 17 mg/dL (ref 6–23)
CO2: 27 meq/L (ref 19–32)
Calcium: 9.2 mg/dL (ref 8.4–10.5)
Chloride: 105 meq/L (ref 96–112)
Creatinine, Ser: 0.77 mg/dL (ref 0.40–1.20)
GFR: 78.06 mL/min (ref >60.00)
Glucose, Bld: 87 mg/dL (ref 70–99)
Potassium: 4.1 meq/L (ref 3.5–5.1)
Sodium: 140 meq/L (ref 135–145)

## 2024-08-29 LAB — HEPATIC FUNCTION PANEL
ALT: 15 U/L (ref 0–35)
AST: 15 U/L (ref 0–37)
Albumin: 4 g/dL (ref 3.5–5.2)
Alkaline Phosphatase: 60 U/L (ref 39–117)
Bilirubin, Direct: 0.1 mg/dL (ref 0.0–0.3)
Total Bilirubin: 0.4 mg/dL (ref 0.2–1.2)
Total Protein: 7 g/dL (ref 6.0–8.3)

## 2024-08-29 LAB — LIPID PANEL
Cholesterol: 232 mg/dL — ABNORMAL HIGH (ref 0–200)
HDL: 66 mg/dL (ref >39.00)
LDL Cholesterol: 147 mg/dL — ABNORMAL HIGH (ref 0–99)
NonHDL: 166.23
Total CHOL/HDL Ratio: 4
Triglycerides: 94 mg/dL (ref 0.0–149.0)
VLDL: 18.8 mg/dL (ref 0.0–40.0)

## 2024-08-29 LAB — TSH: TSH: 0.42 u[IU]/mL (ref 0.35–5.50)

## 2024-08-30 ENCOUNTER — Other Ambulatory Visit: Payer: Self-pay

## 2024-08-30 DIAGNOSIS — E785 Hyperlipidemia, unspecified: Secondary | ICD-10-CM

## 2024-08-30 MED ORDER — ROSUVASTATIN CALCIUM 10 MG PO TABS
10.0000 mg | ORAL_TABLET | Freq: Every day | ORAL | 1 refills | Status: AC
Start: 2024-08-30 — End: ?

## 2024-08-30 NOTE — Telephone Encounter (Signed)
 I have sent the prescription for Crestor  to your local pharmacy.  This is once nightly.  To make sure that you are metabolizing the medication appropriately, we will repeat your liver functions at a lab only visit in 6 weeks (LFTs, dx hyperlipidemia).  We will repeat your cholesterol at your follow up visit in 6 months and see how things are going

## 2024-08-30 NOTE — Telephone Encounter (Signed)
 Sent message to pt LFT ordered.

## 2024-09-11 ENCOUNTER — Other Ambulatory Visit: Payer: Self-pay | Admitting: Adult Health

## 2024-09-11 DIAGNOSIS — Z170 Estrogen receptor positive status [ER+]: Secondary | ICD-10-CM

## 2024-09-11 NOTE — Telephone Encounter (Signed)
 Pt requested a refill of Anastrozole  1Mg  Tablets, Pt request 90 days supply

## 2024-09-17 ENCOUNTER — Ambulatory Visit: Attending: General Surgery

## 2024-09-17 VITALS — Wt 153.0 lb

## 2024-09-17 DIAGNOSIS — Z483 Aftercare following surgery for neoplasm: Secondary | ICD-10-CM | POA: Insufficient documentation

## 2024-09-17 NOTE — Therapy (Signed)
 OUTPATIENT PHYSICAL THERAPY SOZO SCREENING NOTE   Patient Name: Julie Pugh MRN: [id number] DOB:March 23, 1956, 68 y.o., female Today's Date: 09/17/2024  PCP: [name], MD REFERRING PROVIDER: Ebbie Cough, MD   PT End of Session - 09/17/24 1508     Visit Number 2   # unchanged due to screen only   PT Start Time 1506    PT Stop Time 1510    PT Time Calculation (min) 4 min    Activity Tolerance Patient tolerated treatment well    Behavior During Therapy WFL for tasks assessed/performed          Past Medical History:  Diagnosis Date   Breast cancer (HCC)    Family history of breast cancer    Family history of pancreatic cancer    Family history of prostate cancer    Hx of melanoma of skin    Hyperlipidemia    Hypertension    Hypothyroidism    Dr. Edna managing. armour thyroid  60mg     Melanoma (HCC)    R shin 2009. sees derm q6 months   Personal history of radiation therapy    Varicose vein    Past Surgical History:  Procedure Laterality Date   BREAST BIOPSY     benign 2000-stereotactic biopsy   BREAST BIOPSY Right 03/12/2004   BREAST LUMPECTOMY     BREAST LUMPECTOMY WITH RADIOACTIVE SEED AND SENTINEL LYMPH NODE BIOPSY Left 03/16/2022   Procedure: LEFT BREAST LUMPECTOMY WITH RADIOACTIVE SEED AND AXILLARY SENTINEL LYMPH NODE BIOPSY;  Surgeon: Ebbie Cough, MD;  Location: [facility];  Service: General;  Laterality: Left;   MENISCUS REPAIR Left 03/2023   VARICOSE VEIN SURGERY     2001   Patient Active Problem List   Diagnosis Date Noted   Benign neoplasm of skin 08/28/2024   Rosacea 08/28/2024   Radial styloid tenosynovitis 06/25/2024   Bilateral thumb pain 04/24/2024   Trigger thumb of both hands 04/11/2024   Acute medial meniscus tear of left knee 02/23/2023   OSA (obstructive sleep apnea) 11/09/2022   Snoring 08/13/2022   Bronchiectasis without complication (HCC) 08/13/2022   HTN (hypertension) 07/04/2022   Physical  exam 05/20/2022   Family history of breast cancer 05/05/2022   Family history of pancreatic cancer 05/05/2022   Family history of prostate cancer 05/05/2022   Malignant neoplasm of upper-outer quadrant of left breast in female, estrogen receptor positive (HCC) 02/12/2022   History of COVID-19 06/12/2020   Hyperlipidemia 07/10/2015   Hypothyroidism    History of melanoma    Varicose vein     REFERRING DIAG: right breast cancer at risk for lymphedema  THERAPY DIAG:  Aftercare following surgery for neoplasm  PERTINENT HISTORY: Patient was diagnosed on 02/08/22 with left grade 2. It measures less than 1 cm and is located in the upper outer quadrant. It is ER/PR+ (HER2 equivocal - waiting on results) with a Ki67 of 30%. L Lumpectomy and SLNB (0/2) on 03/16/22   PRECAUTIONS: right UE Lymphedema risk  SUBJECTIVE: Here for SOZO screen  PAIN:  Are you having pain? No  SOZO SCREENING: Patient was assessed today using the SOZO machine to determine the lymphedema index score. This was compared to her baseline score. It was determined that she is within the recommended range when compared to her baseline and no further action is needed at this time. She will continue SOZO screenings. These are done every 3 months for 2 years post operatively followed by every 6 months for 2  years, and then annually.   L-DEX FLOWSHEETS - 09/17/24 1500       L-DEX LYMPHEDEMA SCREENING   Measurement Type Unilateral    L-DEX MEASUREMENT EXTREMITY Upper Extremity    POSITION  Standing    DOMINANT SIDE Right    At Risk Side Left    BASELINE SCORE (UNILATERAL) -2.7    L-DEX SCORE (UNILATERAL) -4.6    VALUE CHANGE (UNILAT) -1.9          P: Cont 6 months SOZO's until 03/2026.

## 2024-09-19 ENCOUNTER — Inpatient Hospital Stay (HOSPITAL_BASED_OUTPATIENT_CLINIC_OR_DEPARTMENT_OTHER): Admission: RE | Admit: 2024-09-19 | Discharge: 2024-09-19 | Attending: Hematology and Oncology

## 2024-09-19 DIAGNOSIS — Z780 Asymptomatic menopausal state: Secondary | ICD-10-CM

## 2024-09-25 ENCOUNTER — Inpatient Hospital Stay: Attending: Hematology and Oncology | Admitting: Hematology and Oncology

## 2024-09-25 VITALS — BP 138/70 | HR 65 | Temp 98.0°F | Resp 18 | Wt 153.0 lb

## 2024-09-25 DIAGNOSIS — Z79899 Other long term (current) drug therapy: Secondary | ICD-10-CM | POA: Diagnosis not present

## 2024-09-25 DIAGNOSIS — Z170 Estrogen receptor positive status [ER+]: Secondary | ICD-10-CM | POA: Insufficient documentation

## 2024-09-25 DIAGNOSIS — C50412 Malignant neoplasm of upper-outer quadrant of left female breast: Secondary | ICD-10-CM | POA: Insufficient documentation

## 2024-09-25 DIAGNOSIS — Z79811 Long term (current) use of aromatase inhibitors: Secondary | ICD-10-CM | POA: Diagnosis not present

## 2024-09-25 DIAGNOSIS — Z923 Personal history of irradiation: Secondary | ICD-10-CM | POA: Diagnosis not present

## 2024-09-25 DIAGNOSIS — Z1721 Progesterone receptor positive status: Secondary | ICD-10-CM | POA: Insufficient documentation

## 2024-09-25 DIAGNOSIS — Z1731 Human epidermal growth factor receptor 2 positive status: Secondary | ICD-10-CM | POA: Diagnosis not present

## 2024-09-25 NOTE — Progress Notes (Signed)
 Patient Care Team: [name], MD as PCP - General (Family Medicine) Trixie File, MD as Consulting Physician (Internal Medicine) Odean Potts, MD as Medical Oncologist (Hematology and Oncology)  DIAGNOSIS:  Encounter Diagnosis  Name Primary?   Malignant neoplasm of upper-outer quadrant of left breast in female, estrogen receptor positive (HCC) Yes    SUMMARY OF ONCOLOGIC HISTORY: Oncology History  Malignant neoplasm of upper-outer quadrant of left breast in female, estrogen receptor positive (HCC)  02/12/2022 Initial Diagnosis   Screening mammogram detected left breast mass, by ultrasound measured 0.6 cm, biopsy revealed grade 2 IDC ER 100%, PR 2%, HER2 equivocal by IHC and FISH positive, Ki-67 30%   02/17/2022 Cancer Staging   Staging form: Breast, AJCC 8th Edition - Clinical: Stage IA (cT1b, cN0, cM0, G2, ER+, PR-, HER2+) - Signed by Odean Potts, MD on 02/17/2022 Stage prefix: Initial diagnosis Histologic grading system: 3 grade system   03/16/2022 Surgery   Left lumpectomy: Grade 3 IDC 5 mm, negative for lymphovascular invasion, ER 100%, PR 2%, HER2 FISH positive, margins negative, 0/2 lymph nodes negative   03/29/2022 -  Anti-estrogen oral therapy   Letrozole  daily   04/07/2022 - 05/19/2022 Chemotherapy   Patient is on Treatment Plan : BREAST Trastuzumab  q21d x 13 cycles     04/07/2022 - 12/01/2022 Chemotherapy   Patient is on Treatment Plan : BREAST Trastuzumab  IV (8/6) or SQ (600) D1 q21d     04/29/2022 - 05/26/2022 Radiation Therapy   Adjuvant Radiation therapy   05/05/2022 Genetic Testing   Referral pending   05/09/2022 Cancer Staging   Staging form: Breast, AJCC 8th Edition - Pathologic: Stage IA (pT1a, pN0, cM0, G3, ER+, PR-, HER2+) - Signed by Crawford Morna Pickle, NP on 05/09/2022 Histologic grading system: 3 grade system   05/13/2022 Genetic Testing   Negative genetic testing on the CancerNext-Expanded+RNAinsight panel.  The report date is May 13, 2022.  The CancerNext-Expanded gene panel offered by [facility] and includes sequencing and rearrangement analysis for the following 77 genes: AIP, ALK, APC*, ATM*, AXIN2, BAP1, BARD1, BLM, BMPR1A, BRCA1*, BRCA2*, BRIP1*, CDC73, CDH1*, CDK4, CDKN1B, CDKN2A, CHEK2*, CTNNA1, DICER1, FANCC, FH, FLCN, GALNT12, KIF1B, LZTR1, MAX, MEN1, MET, MLH1*, MSH2*, MSH3, MSH6*, MUTYH*, NBN, NF1*, NF2, NTHL1, PALB2*, PHOX2B, PMS2*, POT1, PRKAR1A, PTCH1, PTEN*, RAD51C*, RAD51D*, RB1, RECQL, RET, SDHA, SDHAF2, SDHB, SDHC, SDHD, SMAD4, SMARCA4, SMARCB1, SMARCE1, STK11, SUFU, TMEM127, TP53*, TSC1, TSC2, VHL and XRCC2 (sequencing and deletion/duplication); EGFR, EGLN1, HOXB13, KIT, MITF, PDGFRA, POLD1, and POLE (sequencing only); EPCAM and GREM1 (deletion/duplication only). DNA and RNA analyses performed for * genes.    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  She remains physically active, playing tennis and pickleball and walking for exercise without limitations.  She completed breast surgery and radiation in August 2023 and has been on letrozole  for about two years. She tolerates letrozole  but has persistent hand stiffness, worse in the right hand, and has had cortisone injections for trigger thumb and De Quervain's tendinitis with relief. She continues racquet sports with grip  adjustments.  She knows her most recent mammogram showed breast density classified as B. She attempted a bone density scan on December 10 but was two days early for  insurance coverage. She asked about duration of adjuvant endocrine therapy, clarified as five to seven years, and noted her Signatera ctDNA tests have remained negative.  She has bronchiectasis and is scheduled for surveillance CT chest in February or March. She reports a mild cough attributed to post-nasal drip without dyspnea, exercise limitation, bronchitis symptoms, or respiratory compromise.       ALLERGIES:  has no known allergies.  MEDICATIONS:  Current Outpatient Medications  Medication Sig Dispense Refill   amLODipine  (NORVASC ) 2.5 MG tablet Take 1 tablet (2.5 mg total) by mouth daily. 90 tablet 1   anastrozole  (ARIMIDEX ) 1 MG tablet TAKE 1 TABLET(1 MG) BY MOUTH DAILY 90 tablet 4   Cholecalciferol (VITAMIN D ) 2000 units tablet Take 5,000 Units by mouth daily.     diphenhydramine -acetaminophen  (TYLENOL  PM) 25-500 MG TABS tablet Take 1 tablet by mouth at bedtime as needed.     levothyroxine  (SYNTHROID ) 100 MCG tablet TAKE 1 TABLET(100 MCG) BY MOUTH DAILY 90 tablet 3   METRONIDAZOLE, TOPICAL, 0.75 % LOTN      rosuvastatin  (CRESTOR ) 10 MG tablet Take 1 tablet (10 mg total) by mouth daily. 90 tablet 1   No current facility-administered medications for this visit.    PHYSICAL EXAMINATION: ECOG PERFORMANCE STATUS: 1 - Symptomatic but completely ambulatory  Vitals:   09/25/24 1413  BP: 138/70  Pulse: 65  Resp: 18  Temp: 98 F (36.7 C)  SpO2: 100%   Filed Weights   09/25/24 1413  Weight: 153 lb (69.4 kg)     LABORATORY DATA:  I have reviewed the data as listed    Latest Ref Rng & Units 08/28/2024    9:12 AM 06/08/2023    2:03 PM 12/03/2022    2:09 PM  CMP  Glucose 70 - 99 mg/dL 87  92  84   BUN 6 - 23 mg/dL 17  26  23    Creatinine 0.40 - 1.20 mg/dL 9.22  9.10  9.07   Sodium 135 - 145 mEq/L 140  140  140   Potassium 3.5 - 5.1 mEq/L 4.1  4.6  4.5   Chloride 96 - 112 mEq/L 105  105  105   CO2 19 - 32 mEq/L 27  29  22    Calcium  8.4 - 10.5 mg/dL 9.2  9.3  9.3   Total  Protein 6.0 - 8.3 g/dL 7.0  6.4  6.8   Total Bilirubin 0.2 - 1.2 mg/dL 0.4  0.5  0.5   Alkaline Phos 39 - 117 U/L 60  67    AST 0 - 37 U/L 15  16  16    ALT 0 - 35 U/L 15  17  15      Lab Results  Component Value Date   WBC 9.0 08/28/2024   HGB 14.0 08/28/2024   HCT 42.0 08/28/2024   MCV 91.6 08/28/2024   PLT 301.0 08/28/2024   NEUTROABS 6.1 08/28/2024    ASSESSMENT & PLAN:  Malignant neoplasm of upper-outer quadrant of left breast in female, estrogen receptor positive (HCC)    02/12/2022:Screening mammogram detected left breast mass, by ultrasound measured 0.6 cm, biopsy revealed grade 2 IDC ER 100%, PR 2%, HER2 equivocal by IHC and FISH positive, Ki-67 30%    Treatment plan: 1.  03/16/2022 Left lumpectomy: Grade 3 IDC 5 mm (6 mm  on biopsy), negative for lymphovascular invasion, ER 100%, PR 2%, HER2 FISH positive, margins negative, 0/2 lymph nodes negative 2.  based on only 6 mm tumor size, we discussed the pros and cons of systemic treatments and decided to treat her with Herceptin  (completed 03/31/2023) with letrozole . 3.  Adjuvant radiation completed 05/26/2022 4.  Followed by adjuvant antiestrogen therapy with letrozole  ---------------------------------------------------------------------------- Current treatment: Letrozole  started August 2023 Letrozole  toxicities: Denies any hot flashes or arthralgias   Bone density December 2023:T-score 0: Normal partner Crystal is also a patient of mine.  They are both avid tennis and pickleball players   Breast cancer surveillance: Breast exam 09/25/2024: Benign Mammogram 01/16/2024: Benign breast density category B Signatera: 08/26/2024 negative   Return to clinic in 1 year for follow-up     No orders of the defined types were placed in this encounter.  The patient has a good understanding of the overall plan. she agrees with it. she will call with any problems that may develop before the next visit here.  I personally spent a total of  30 minutes in the care of the patient today including preparing to see the patient, getting/reviewing separately obtained history, performing a medically appropriate exam/evaluation, counseling and educating, placing orders, referring and communicating with other health care professionals, documenting clinical information in the EHR, independently interpreting results, communicating results, and coordinating care.   Viinay K Susana Gripp, MD 09/25/2024

## 2024-09-25 NOTE — Assessment & Plan Note (Signed)
° °  02/12/2022:Screening mammogram detected left breast mass, by ultrasound measured 0.6 cm, biopsy revealed grade 2 IDC ER 100%, PR 2%, HER2 equivocal by IHC and FISH positive, Ki-67 30%    Treatment plan: 1.  03/16/2022 Left lumpectomy: Grade 3 IDC 5 mm (6 mm on biopsy), negative for lymphovascular invasion, ER 100%, PR 2%, HER2 FISH positive, margins negative, 0/2 lymph nodes negative 2.  based on only 6 mm tumor size, we discussed the pros and cons of systemic treatments and decided to treat her with Herceptin  (completed 03/31/2023) with letrozole . 3.  Adjuvant radiation completed 05/26/2022 4.  Followed by adjuvant antiestrogen therapy with letrozole  ---------------------------------------------------------------------------- Current treatment: Letrozole  started August 2023 Letrozole  toxicities: Denies any hot flashes or arthralgias   Bone density December 2023:T-score 0: Normal partner Crystal is also a patient of mine.  They are both avid tennis and pickleball players   Knee pain: Resolved after minor surgery on the knee   Breast cancer surveillance: Breast exam 09/25/2024: Benign Mammogram 01/16/2024: Benign breast density category B Signatera: 08/26/2024 negative   Return to clinic in 1 year for follow-up

## 2024-09-28 ENCOUNTER — Other Ambulatory Visit: Payer: Self-pay

## 2024-10-10 ENCOUNTER — Ambulatory Visit (HOSPITAL_BASED_OUTPATIENT_CLINIC_OR_DEPARTMENT_OTHER)
Admission: RE | Admit: 2024-10-10 | Discharge: 2024-10-10 | Disposition: A | Discharge status: Home / Self Care | Source: Ambulatory Visit | Attending: Hematology and Oncology | Admitting: Hematology and Oncology

## 2024-10-10 DIAGNOSIS — Z780 Asymptomatic menopausal state: Secondary | ICD-10-CM | POA: Insufficient documentation

## 2024-10-12 ENCOUNTER — Other Ambulatory Visit (INDEPENDENT_AMBULATORY_CARE_PROVIDER_SITE_OTHER)

## 2024-10-12 DIAGNOSIS — E785 Hyperlipidemia, unspecified: Secondary | ICD-10-CM

## 2024-10-12 LAB — HEPATIC FUNCTION PANEL
ALT: 16 U/L (ref 3–35)
AST: 15 U/L (ref 5–37)
Albumin: 4.1 g/dL (ref 3.5–5.2)
Alkaline Phosphatase: 65 U/L (ref 39–117)
Bilirubin, Direct: 0.1 mg/dL (ref 0.1–0.3)
Total Bilirubin: 0.5 mg/dL (ref 0.2–1.2)
Total Protein: 6.8 g/dL (ref 6.0–8.3)

## 2024-10-15 ENCOUNTER — Ambulatory Visit: Payer: Self-pay

## 2024-10-15 ENCOUNTER — Ambulatory Visit: Payer: Self-pay | Admitting: Family Medicine

## 2024-10-15 ENCOUNTER — Encounter: Payer: Self-pay | Admitting: Family Medicine

## 2024-10-15 ENCOUNTER — Encounter: Payer: Self-pay | Admitting: Hematology and Oncology

## 2024-10-17 ENCOUNTER — Ambulatory Visit (HOSPITAL_BASED_OUTPATIENT_CLINIC_OR_DEPARTMENT_OTHER)

## 2024-10-25 ENCOUNTER — Encounter: Admitting: Family Medicine

## 2024-11-13 ENCOUNTER — Encounter: Payer: Self-pay | Admitting: Hematology and Oncology

## 2024-11-14 ENCOUNTER — Ambulatory Visit (HOSPITAL_BASED_OUTPATIENT_CLINIC_OR_DEPARTMENT_OTHER)
Admission: RE | Admit: 2024-11-14 | Discharge: 2024-11-14 | Disposition: A | Source: Ambulatory Visit | Attending: Pulmonary Disease

## 2024-11-14 DIAGNOSIS — J479 Bronchiectasis, uncomplicated: Secondary | ICD-10-CM

## 2024-11-26 ENCOUNTER — Ambulatory Visit (HOSPITAL_BASED_OUTPATIENT_CLINIC_OR_DEPARTMENT_OTHER): Admitting: Pulmonary Disease

## 2025-02-25 ENCOUNTER — Ambulatory Visit: Admitting: Family Medicine

## 2025-03-18 ENCOUNTER — Ambulatory Visit

## 2025-07-02 ENCOUNTER — Ambulatory Visit: Admitting: Internal Medicine

## 2025-09-26 ENCOUNTER — Inpatient Hospital Stay: Admitting: Hematology and Oncology
